# Patient Record
Sex: Male | Born: 1995 | Race: White | Hispanic: No | Marital: Single | Smoking: Current every day smoker
Health system: Southern US, Community
[De-identification: ages and names within clinical notes are randomized; demographics above are authoritative.]

## PROBLEM LIST (undated history)

## (undated) DIAGNOSIS — F32A Depression, unspecified: Secondary | ICD-10-CM

## (undated) DIAGNOSIS — K219 Gastro-esophageal reflux disease without esophagitis: Secondary | ICD-10-CM

## (undated) DIAGNOSIS — F1920 Other psychoactive substance dependence, uncomplicated: Secondary | ICD-10-CM

## (undated) DIAGNOSIS — R569 Unspecified convulsions: Secondary | ICD-10-CM

## (undated) DIAGNOSIS — T7840XA Allergy, unspecified, initial encounter: Secondary | ICD-10-CM

## (undated) DIAGNOSIS — D649 Anemia, unspecified: Secondary | ICD-10-CM

## (undated) DIAGNOSIS — F1910 Other psychoactive substance abuse, uncomplicated: Secondary | ICD-10-CM

## (undated) DIAGNOSIS — Z8619 Personal history of other infectious and parasitic diseases: Secondary | ICD-10-CM

## (undated) DIAGNOSIS — F329 Major depressive disorder, single episode, unspecified: Secondary | ICD-10-CM

## (undated) DIAGNOSIS — F419 Anxiety disorder, unspecified: Secondary | ICD-10-CM

## (undated) HISTORY — DX: Anemia, unspecified: D64.9

## (undated) HISTORY — DX: Anxiety disorder, unspecified: F41.9

## (undated) HISTORY — DX: Major depressive disorder, single episode, unspecified: F32.9

## (undated) HISTORY — PX: NO PAST SURGERIES: SHX2092

## (undated) HISTORY — DX: Personal history of other infectious and parasitic diseases: Z86.19

## (undated) HISTORY — DX: Depression, unspecified: F32.A

## (undated) HISTORY — DX: Unspecified convulsions: R56.9

## (undated) HISTORY — DX: Gastro-esophageal reflux disease without esophagitis: K21.9

## (undated) HISTORY — DX: Other psychoactive substance abuse, uncomplicated: F19.10

## (undated) HISTORY — DX: Allergy, unspecified, initial encounter: T78.40XA

---
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
## 2017-01-23 ENCOUNTER — Other Ambulatory Visit: Payer: Self-pay

## 2017-01-23 ENCOUNTER — Encounter: Payer: Self-pay | Admitting: Physician Assistant

## 2017-01-23 ENCOUNTER — Ambulatory Visit (INDEPENDENT_AMBULATORY_CARE_PROVIDER_SITE_OTHER): Payer: Managed Care, Other (non HMO) | Admitting: Physician Assistant

## 2017-01-23 VITALS — BP 118/84 | HR 95 | Temp 98.1°F | Resp 16 | Ht 71.0 in | Wt 173.0 lb

## 2017-01-23 DIAGNOSIS — F1290 Cannabis use, unspecified, uncomplicated: Secondary | ICD-10-CM

## 2017-01-23 DIAGNOSIS — F329 Major depressive disorder, single episode, unspecified: Secondary | ICD-10-CM | POA: Diagnosis not present

## 2017-01-23 DIAGNOSIS — F12988 Cannabis use, unspecified with other cannabis-induced disorder: Secondary | ICD-10-CM

## 2017-01-23 DIAGNOSIS — F419 Anxiety disorder, unspecified: Secondary | ICD-10-CM

## 2017-01-23 DIAGNOSIS — K219 Gastro-esophageal reflux disease without esophagitis: Secondary | ICD-10-CM

## 2017-01-23 DIAGNOSIS — F32A Depression, unspecified: Secondary | ICD-10-CM

## 2017-01-23 LAB — COMPREHENSIVE METABOLIC PANEL
ALT: 20 U/L (ref 0–53)
AST: 23 U/L (ref 0–37)
Albumin: 5.4 g/dL — ABNORMAL HIGH (ref 3.5–5.2)
Alkaline Phosphatase: 60 U/L (ref 52–171)
BUN: 11 mg/dL (ref 6–23)
CO2: 28 mEq/L (ref 19–32)
Calcium: 10.5 mg/dL (ref 8.4–10.5)
Chloride: 101 mEq/L (ref 96–112)
Creatinine, Ser: 1.08 mg/dL (ref 0.40–1.50)
GFR: 93.02 mL/min (ref >60.00)
Glucose, Bld: 111 mg/dL — ABNORMAL HIGH (ref 70–99)
Potassium: 4 mEq/L (ref 3.5–5.1)
Sodium: 137 mEq/L (ref 135–145)
Total Bilirubin: 0.8 mg/dL (ref 0.2–1.2)
Total Protein: 7.8 g/dL (ref 6.0–8.3)

## 2017-01-23 LAB — CBC
HCT: 45.2 % (ref 36.0–49.0)
Hemoglobin: 15.4 g/dL (ref 12.0–16.0)
MCHC: 34.2 g/dL (ref 31.0–37.0)
MCV: 92.8 fl (ref 78.0–98.0)
Platelets: 279 10*3/uL (ref 150.0–575.0)
RBC: 4.87 Mil/uL (ref 3.80–5.70)
RDW: 13.3 % (ref 11.4–15.5)
WBC: 9.9 10*3/uL (ref 4.5–13.5)

## 2017-01-23 LAB — TSH: TSH: 1.86 u[IU]/mL (ref 0.40–5.00)

## 2017-01-23 MED ORDER — ESCITALOPRAM OXALATE 10 MG PO TABS: 10.0000 mg | ORAL_TABLET | Freq: Every day | ORAL | 1 refills | Status: DC

## 2017-01-23 MED ORDER — PANTOPRAZOLE SODIUM 40 MG PO TBEC: 40.0000 mg | DELAYED_RELEASE_TABLET | Freq: Every day | ORAL | 3 refills | Status: DC

## 2017-01-23 NOTE — Patient Instructions (Signed)
Please go to the lab today for blood work.  I will call you with your results. We will alter treatment regimen(s) if indicated by your results.   Please start the Protonix daily as directed. Start a ginger supplement for nausea. Avoid any alcohol consumption of marijuana use. The alcohol can worsen reflux and repetitive marijuana use can cause cyclical vomiting.  Please start the Lexapro as directed. Download the [employer] APP for your phone and try to do these exercises daily.   Please use the handout given to schedule an appointment with counseling. There is a list of crises numbers as well if needed.  You will get through this!  Follow-up with me in 2-3 weeks for reassessment. Return sooner if needed.  Er for any suicidal thoughts or ideations.  Welcome to [facility]!

## 2017-01-23 NOTE — Progress Notes (Signed)
Patient presents to clinic today to establish care.  Patient endorses an issue over the past several months with intermittent episodes of heartburn and indigestion, sometimes associated with nausea.  Patient endorses this is gotten worse over the past few weeks, especially over the past 3 days.  Has had a couple episodes of nonbloody emesis.  States this sometimes happens after meals but not always.  Patient denies any abdominal pain, diarrhea, constipation, tenesmus, melena or hematochezia.  Patient endorses a history of acid reflux, requiring previous treatment.  Is not currently taking anything for symptoms.  Patient endorses he eats late at night.  Does drink alcohol occasionally but is not a daily thing.  Notes he does smoke marijuana on a regular basis.  On questioning of marijuana use, patient reveals that he uses this for episodes of significant anxiety.  Patient endorses a history of depression, previously treated with Lexapro.  Has noted significant changes recently including moving into a new apartment and losing his girlfriend.  Patient notes that she was cheating on him with his roommate.  Has had a very hard time dealing with this.  Endorses significantly depressed mood and anhedonia.  Notes this is mixed with episodes of acute anxiety.  Denies chest pain or shortness of breath.  Denies suicidal thought or ideation.   Past Medical History:  Diagnosis Date  . Allergy   . Depression   . GERD (gastroesophageal reflux disease)   . History of chickenpox     Past Surgical History:  Procedure Laterality Date  . NO PAST SURGERIES      No current outpatient medications on file prior to visit.   No current facility-administered medications on file prior to visit.     No Known Allergies  Family History  Problem Relation Age of Onset  . Alcohol abuse Father   . Alcohol abuse Brother   . Asthma Brother   . Diabetes Brother   . Arthritis Maternal Grandmother   . Asthma Maternal  Grandmother   . COPD Maternal Grandmother   . Alcohol abuse Maternal Grandfather   . Alcohol abuse Paternal Grandfather   . Heart attack Paternal Grandfather   . Hyperlipidemia Paternal Grandfather     Social History   Socioeconomic History  . Marital status: Married    Spouse name: Not on file  . Number of children: Not on file  . Years of education: Not on file  . Highest education level: Not on file  Social Needs  . Financial resource strain: Not on file  . Food insecurity - worry: Not on file  . Food insecurity - inability: Not on file  . Transportation needs - medical: Not on file  . Transportation needs - non-medical: Not on file  Occupational History  . Not on file  Tobacco Use  . Smoking status: Smoker, Current Status Unknown    Packs/day: 0.50    Types: Cigarettes  . Smokeless tobacco: Never Used  Substance and Sexual Activity  . Alcohol use: Yes  . Drug use: No  . Sexual activity: Yes  Other Topics Concern  . Not on file  Social History Narrative  . Not on file   Review of Systems  Constitutional: Negative for chills, fever and malaise/fatigue.  Eyes: Negative for blurred vision and double vision.  Respiratory: Negative for cough, sputum production and shortness of breath.   Cardiovascular: Negative for chest pain.  Gastrointestinal: Positive for heartburn, nausea and vomiting. Negative for abdominal pain, blood in stool, constipation, diarrhea  and melena.  Genitourinary: Negative for dysuria, flank pain, frequency and urgency.  Musculoskeletal: Negative for myalgias.  Neurological: Negative for dizziness and loss of consciousness.  Psychiatric/Behavioral: Positive for depression and substance abuse. Negative for hallucinations, memory loss and suicidal ideas. The patient is nervous/anxious. The patient does not have insomnia.     BP 118/84   Pulse 95   Temp 98.1 F (36.7 C) (Oral)   Resp 16   Ht 5\' 11"  (1.803 m)   Wt 173 lb (78.5 kg)   SpO2 99%    BMI 24.13 kg/m   Physical Exam  Constitutional: He is oriented to person, place, and time and well-developed, well-nourished, and in no distress.  HENT:  Head: Normocephalic and atraumatic.  Right Ear: External ear normal.  Left Ear: External ear normal.  Nose: Nose normal.  Mouth/Throat: Oropharynx is clear and moist. No oropharyngeal exudate.  TM within normal limits bilaterally.  Eyes: Conjunctivae and EOM are normal. Pupils are equal, round, and reactive to light.  Neck: Neck supple.  Cardiovascular: Normal rate, regular rhythm, normal heart sounds and intact distal pulses.  Pulmonary/Chest: Effort normal and breath sounds normal. No respiratory distress. He has no wheezes. He has no rales. He exhibits no tenderness.  Abdominal: Soft. Bowel sounds are normal. He exhibits no distension. There is no tenderness.  Lymphadenopathy:    He has no cervical adenopathy.  Neurological: He is alert and oriented to person, place, and time. No cranial nerve deficit.  Skin: Skin is warm and dry. No rash noted.  Psychiatric: His mood appears anxious. His affect is not labile. He is not agitated. He does not express impulsivity. He exhibits a depressed mood. He expresses no homicidal and no suicidal ideation. He expresses no suicidal plans and no homicidal plans. He is not apathetic. He exhibits ordered thought content. He does not have a flat affect.  Vitals reviewed.  Assessment/Plan: Gastroesophageal reflux disease without esophagitis Discussed GERD diet.  Encourage patient to start a probiotic.  We will start Protonix 40 mg once daily times 2 weeks.  Patient to avoid late night eating and elevate head of his bed.  Suspect that increase in reflux is secondary to his significant anxiety area and unfortunately these go hand in hand.  We will also work on treatment for his anxiety and depressed mood and see how gastrointestinal symptoms respond.  Close follow-up scheduled.  Anxiety and depression New  onset in patient with a prior history of depression.  Will start Lexapro.  Counseling recommended.  Handout given for counseling services and crises Hotlines.  No current alarm signs or symptoms.  Will check CBC, TSH.  Patient encouraged to start mindfulness training.  Alarm signs and symptoms discussed with patient that would prompt need for immediate emergency evaluation.  Patient voices understanding and agreement with plan.  Follow-up scheduled.  Cannabinoid hyperemesis syndrome (HCC) Discussed with patient that vomiting episode is likely secondary to both acid reflux and consistent use of marijuana.  On further discussion patient does endorse that his symptoms of nausea and vomiting vastly improve when taking a hot shower.  This is atypical response to nausea and vomiting in a patient suffering from cannabinoids hyperemesis syndrome.  Patient encouraged to discontinue use.  Will check CBC and CMP today. A close follow-up scheduled.     Piedad ClimesWilliam Cody Michiel Sivley, PA-C

## 2017-01-23 NOTE — Progress Notes (Signed)
Pre visit review using our clinic review tool, if applicable. No additional management support is needed unless otherwise documented below in the visit note. 

## 2017-01-24 DIAGNOSIS — R112 Nausea with vomiting, unspecified: Secondary | ICD-10-CM | POA: Insufficient documentation

## 2017-01-24 DIAGNOSIS — F419 Anxiety disorder, unspecified: Secondary | ICD-10-CM | POA: Insufficient documentation

## 2017-01-24 DIAGNOSIS — F12988 Cannabis use, unspecified with other cannabis-induced disorder: Secondary | ICD-10-CM

## 2017-01-24 DIAGNOSIS — F329 Major depressive disorder, single episode, unspecified: Secondary | ICD-10-CM | POA: Insufficient documentation

## 2017-01-24 DIAGNOSIS — K219 Gastro-esophageal reflux disease without esophagitis: Secondary | ICD-10-CM | POA: Insufficient documentation

## 2017-01-24 DIAGNOSIS — F32A Depression, unspecified: Secondary | ICD-10-CM | POA: Insufficient documentation

## 2017-01-24 NOTE — Assessment & Plan Note (Signed)
Discussed with patient that vomiting episode is likely secondary to both acid reflux and consistent use of marijuana.  On further discussion patient does endorse that his symptoms of nausea and vomiting vastly improve when taking a hot shower.  This is atypical response to nausea and vomiting in a patient suffering from cannabinoids hyperemesis syndrome.  Patient encouraged to discontinue use.  Will check CBC and CMP today. A close follow-up scheduled.

## 2017-01-24 NOTE — Assessment & Plan Note (Signed)
Discussed GERD diet.  Encourage patient to start a probiotic.  We will start Protonix 40 mg once daily times 2 weeks.  Patient to avoid late night eating and elevate head of his bed.  Suspect that increase in reflux is secondary to his significant anxiety area and unfortunately these go hand in hand.  We will also work on treatment for his anxiety and depressed mood and see how gastrointestinal symptoms respond.  Close follow-up scheduled.

## 2017-01-24 NOTE — Assessment & Plan Note (Signed)
New onset in patient with a prior history of depression.  Will start Lexapro.  Counseling recommended.  Handout given for counseling services and crises Hotlines.  No current alarm signs or symptoms.  Will check CBC, TSH.  Patient encouraged to start mindfulness training.  Alarm signs and symptoms discussed with patient that would prompt need for immediate emergency evaluation.  Patient voices understanding and agreement with plan.  Follow-up scheduled.

## 2017-02-13 ENCOUNTER — Ambulatory Visit: Payer: Managed Care, Other (non HMO) | Admitting: Physician Assistant

## 2017-02-25 ENCOUNTER — Ambulatory Visit: Payer: Managed Care, Other (non HMO) | Admitting: Physician Assistant

## 2017-02-25 ENCOUNTER — Encounter: Payer: Self-pay | Admitting: Physician Assistant

## 2017-02-25 VITALS — BP 118/64 | HR 73 | Temp 98.2°F | Resp 14 | Ht 71.0 in | Wt 173.0 lb

## 2017-02-25 DIAGNOSIS — K219 Gastro-esophageal reflux disease without esophagitis: Secondary | ICD-10-CM

## 2017-02-25 DIAGNOSIS — F419 Anxiety disorder, unspecified: Secondary | ICD-10-CM | POA: Diagnosis not present

## 2017-02-25 DIAGNOSIS — F32A Depression, unspecified: Secondary | ICD-10-CM

## 2017-02-25 DIAGNOSIS — F329 Major depressive disorder, single episode, unspecified: Secondary | ICD-10-CM

## 2017-02-25 MED ORDER — ESCITALOPRAM OXALATE 20 MG PO TABS: 20.0000 mg | ORAL_TABLET | Freq: Every day | ORAL | 1 refills | Status: DC

## 2017-02-25 NOTE — Assessment & Plan Note (Signed)
Much improved. Continue current regimen. Follow-up 2 months. We will work on wean from medication at that point. Continue GERD diet.

## 2017-02-25 NOTE — Patient Instructions (Signed)
Please stay well-hydrated and keep a well-balanced diet. Continue the Protonix. Start the new dose of Lexapro daily.  Follow recommendations below. Try the Pure ZZZs over the counter or unisom to help with sleep as needed.  Follow-up 2 months.  Sleep Hygiene  Do: (1) Go to bed at the same time each day. (2) Get up from bed at the same time each day. (3) Get regular exercise each day, preferably in the morning.  There is goof evidence that regular exercise improves restful sleep.  This includes stretching and aerobic exercise. (4) Get regular exposure to outdoor or bright lights, especially in the late afternoon. (5) Keep the temperature in your bedroom comfortable. (6) Keep the bedroom quiet when sleeping. (7) Keep the bedroom dark enough to facilitate sleep. (8) Use your bed only for sleep and sex. (9) Take medications as directed.  It is helpful to take prescribed sleeping pills 1 hour before bedtime, so they are causing drowsiness when you lie down, or 10 hours before getting up, to avoid daytime drowsiness. (10) Use a relaxation exercise just before going to sleep -- imagery, massage, warm bath. (11) Keep your feet and hands warm.  Wear warm socks and/or mittens or gloves to bed.  Don't: (1) Exercise just before going to bed. (2) Engage in stimulating activity just before bed, such as playing a competitive game, watching an exciting program on television, or having an important discussion with a loved one. (3) Have caffeine in the evening (coffee, teas, chocolate, sodas, etc.) (4) Read or watch television in bed. (5) Use alcohol to help you sleep. (6) Go to bed too hungry or too full. (7) Take another person's sleeping pills. (8) Take over-the-counter sleeping pills, without your doctor's knowledge.  Tolerance can develop rapidly with these medications.  Diphenhydramine can have serious side effects for elderly patients. (9) Take daytime naps. (10) Command yourself to go to  sleep.  This only makes your mind and body more alert.  If you lie awake for more than 20-30 minutes, get up, go to a different room, participate in a quiet activity (Ex - non-excitable reading or television), and then return to bed when you feel sleepy.  Do this as many times during the night as needed.  This may cause you to have a night or two of poor sleep but it will train your brain to know when it is time for sleep.

## 2017-02-25 NOTE — Assessment & Plan Note (Signed)
Improving. Will increase Lexapro to 20 mg daily. Sleep hygiene recommendations given. Recommended OTC Pure ZZZs at night. Follow-up scheduled.

## 2017-02-25 NOTE — Progress Notes (Signed)
Patient presents to clinic today for follow-up of GERD and generalized anxiety disorder.  At last visit, we started patient on Protonix daily. Is taking as directed and tolerating well. Notes resolution of nausea and vomiting along with heartburn with this regimen. Is hydrating and eating better. Denies abdominal pain or cramping. Denies change to bowel or bladder habits.  Patient also currently on a regimen of Lexapro 10 mg daily. Is taking as directed. Notes tolerating well. Patient endorses improvement in anxiety from last visit. Denies panic attack or easy tearfulness. Is still adjusting to new home. Is having some issue still with falling and staying asleep. Is averaging about 4 hours of sleep per night. Is taking OTC melatonin occasionally with some improvement.   Past Medical History:  Diagnosis Date  . Allergy   . Depression   . GERD (gastroesophageal reflux disease)   . History of chickenpox     Current Outpatient Medications on File Prior to Visit  Medication Sig Dispense Refill  . pantoprazole (PROTONIX) 40 MG tablet Take 1 tablet (40 mg total) by mouth daily. 30 tablet 3   No current facility-administered medications on file prior to visit.     No Known Allergies  Family History  Problem Relation Age of Onset  . Alcohol abuse Father   . Alcohol abuse Brother   . Asthma Brother   . Diabetes Brother   . Arthritis Maternal Grandmother   . Asthma Maternal Grandmother   . COPD Maternal Grandmother   . Alcohol abuse Maternal Grandfather   . Alcohol abuse Paternal Grandfather   . Heart attack Paternal Grandfather   . Hyperlipidemia Paternal Grandfather     Social History   Socioeconomic History  . Marital status: Married    Spouse name: None  . Number of children: None  . Years of education: None  . Highest education level: None  Social Needs  . Financial resource strain: None  . Food insecurity - worry: None  . Food insecurity - inability: None  .  Transportation needs - medical: None  . Transportation needs - non-medical: None  Occupational History  . None  Tobacco Use  . Smoking status: Smoker, Current Status Unknown    Packs/day: 0.50    Types: Cigarettes  . Smokeless tobacco: Never Used  Substance and Sexual Activity  . Alcohol use: Yes  . Drug use: No  . Sexual activity: Yes  Other Topics Concern  . None  Social History Narrative  . None   Review of Systems - See HPI.  All other ROS are negative.  BP 118/64   Pulse 73   Temp 98.2 F (36.8 C) (Oral)   Resp 14   Ht 5' 11" (1.803 m)   Wt 173 lb (78.5 kg)   SpO2 99%   BMI 24.13 kg/m   Physical Exam  Constitutional: He is oriented to person, place, and time and well-developed, well-nourished, and in no distress.  HENT:  Head: Normocephalic and atraumatic.  Eyes: Conjunctivae are normal.  Neck: Neck supple.  Cardiovascular: Normal rate, regular rhythm, normal heart sounds and intact distal pulses.  Pulmonary/Chest: Effort normal and breath sounds normal. No respiratory distress. He has no wheezes. He has no rales. He exhibits no tenderness.  Abdominal: Soft. Bowel sounds are normal. He exhibits no distension.  Neurological: He is alert and oriented to person, place, and time.  Skin: Skin is warm and dry. No rash noted.  Psychiatric: Affect normal.  Vitals reviewed.  Recent Results (from  the past 2160 hour(s))  Comp Met (CMET)     Status: Abnormal   Collection Time: 01/23/17  2:59 PM  Result Value Ref Range   Sodium 137 135 - 145 mEq/L   Potassium 4.0 3.5 - 5.1 mEq/L   Chloride 101 96 - 112 mEq/L   CO2 28 19 - 32 mEq/L   Glucose, Bld 111 (H) 70 - 99 mg/dL   BUN 11 6 - 23 mg/dL   Creatinine, Ser 1.08 0.40 - 1.50 mg/dL   Total Bilirubin 0.8 0.2 - 1.2 mg/dL   Alkaline Phosphatase 60 52 - 171 U/L   AST 23 0 - 37 U/L   ALT 20 0 - 53 U/L   Total Protein 7.8 6.0 - 8.3 g/dL   Albumin 5.4 (H) 3.5 - 5.2 g/dL   Calcium 10.5 8.4 - 10.5 mg/dL   GFR 93.02 >60.00  mL/min  TSH     Status: None   Collection Time: 01/23/17  2:59 PM  Result Value Ref Range   TSH 1.86 0.40 - 5.00 uIU/mL  CBC     Status: None   Collection Time: 01/23/17  2:59 PM  Result Value Ref Range   WBC 9.9 4.5 - 13.5 K/uL   RBC 4.87 3.80 - 5.70 Mil/uL   Platelets 279.0 150.0 - 575.0 K/uL   Hemoglobin 15.4 12.0 - 16.0 g/dL   HCT 45.2 36.0 - 49.0 %   MCV 92.8 78.0 - 98.0 fl   MCHC 34.2 31.0 - 37.0 g/dL   RDW 13.3 11.4 - 15.5 %   Assessment/Plan: Gastroesophageal reflux disease without esophagitis Much improved. Continue current regimen. Follow-up 2 months. We will work on wean from medication at that point. Continue GERD diet.   Anxiety and depression Improving. Will increase Lexapro to 20 mg daily. Sleep hygiene recommendations given. Recommended OTC Pure ZZZs at night. Follow-up scheduled.     Leeanne Rio, PA-C

## 2017-03-04 ENCOUNTER — Telehealth: Payer: Self-pay | Admitting: Physician Assistant

## 2017-03-04 NOTE — Telephone Encounter (Signed)
Ok to phone in a script for alprazolam 0.5 mg. Take 1 tablet by mouth QHS as needed for acute anxiety. Quantity 15 with 0 refills.

## 2017-03-04 NOTE — Telephone Encounter (Signed)
Copied from CRM [id number]. Topic: Quick Communication - See Telephone Encounter >> Mar 04, 2017 10:29 AM Clack, Princella Pellegrini wrote: CRM for notification. See Telephone encounter for:  Pt grandmother Corey Rivera wanted to know if the provider could call the pt in something for anxiety attacks. States the pt had a bad one last night and has them mostly at night time.  03/04/17.

## 2017-03-04 NOTE — Telephone Encounter (Signed)
Patient states that he is having anxiety attacks mainly at night.  He said when he goes to try to go to sleep his mind starts racing and the anxiety just builds and he ends up being up for several hours before it calms down enough for him to go to sleep.  He said this happens around 3 times a week or so.   He states that he does not want to be on anything every day, just something when he needs it.

## 2017-03-04 NOTE — Telephone Encounter (Signed)
Spoke with patient's grandmother - patient is taking Lexapro regularly but is really stressed after the breakup with his girlfriend.  At night he seems to get "worked up" and has an anxiety attack.   Patient and grandmother are asking if there is anything that can be given to him to just have on hand to take when this happens.    Routed to provider to advise

## 2017-03-04 NOTE — Telephone Encounter (Signed)
Attempted to call patient to discuss. VM is not set up so I could not leave a message.  CRM created so that [facility] knows to transfer to [place] if patient calls back.

## 2017-03-04 NOTE — Telephone Encounter (Signed)
I am fine with considering this but I want [place] to talk to the actual patient regarding symptom severity to get a better idea of what all is going on. Can we have him reach out to [place] as well?

## 2017-03-04 NOTE — Telephone Encounter (Addendum)
Medication has been called in to [facility] in [place].   Patient is aware.

## 2017-03-10 ENCOUNTER — Other Ambulatory Visit: Payer: Self-pay

## 2017-03-10 ENCOUNTER — Emergency Department (HOSPITAL_COMMUNITY): Payer: Managed Care, Other (non HMO)

## 2017-03-10 ENCOUNTER — Encounter (HOSPITAL_COMMUNITY): Payer: Self-pay | Admitting: Emergency Medicine

## 2017-03-10 ENCOUNTER — Emergency Department (HOSPITAL_COMMUNITY)
Admission: EM | Admit: 2017-03-10 | Discharge: 2017-03-11 | Disposition: A | Discharge status: Home / Self Care | Payer: Managed Care, Other (non HMO) | Attending: Emergency Medicine | Admitting: Emergency Medicine

## 2017-03-10 DIAGNOSIS — T43292A Poisoning by other antidepressants, intentional self-harm, initial encounter: Secondary | ICD-10-CM | POA: Insufficient documentation

## 2017-03-10 DIAGNOSIS — F1220 Cannabis dependence, uncomplicated: Secondary | ICD-10-CM | POA: Diagnosis not present

## 2017-03-10 DIAGNOSIS — F1420 Cocaine dependence, uncomplicated: Secondary | ICD-10-CM | POA: Diagnosis not present

## 2017-03-10 DIAGNOSIS — T43224A Poisoning by selective serotonin reuptake inhibitors, undetermined, initial encounter: Secondary | ICD-10-CM | POA: Diagnosis not present

## 2017-03-10 DIAGNOSIS — R45851 Suicidal ideations: Secondary | ICD-10-CM | POA: Insufficient documentation

## 2017-03-10 DIAGNOSIS — T424X4A Poisoning by benzodiazepines, undetermined, initial encounter: Secondary | ICD-10-CM | POA: Diagnosis not present

## 2017-03-10 DIAGNOSIS — F1910 Other psychoactive substance abuse, uncomplicated: Secondary | ICD-10-CM | POA: Diagnosis present

## 2017-03-10 DIAGNOSIS — F1020 Alcohol dependence, uncomplicated: Secondary | ICD-10-CM | POA: Diagnosis not present

## 2017-03-10 DIAGNOSIS — F419 Anxiety disorder, unspecified: Secondary | ICD-10-CM | POA: Diagnosis present

## 2017-03-10 DIAGNOSIS — F329 Major depressive disorder, single episode, unspecified: Secondary | ICD-10-CM

## 2017-03-10 DIAGNOSIS — Z79899 Other long term (current) drug therapy: Secondary | ICD-10-CM | POA: Diagnosis not present

## 2017-03-10 DIAGNOSIS — T50902A Poisoning by unspecified drugs, medicaments and biological substances, intentional self-harm, initial encounter: Secondary | ICD-10-CM

## 2017-03-10 DIAGNOSIS — F332 Major depressive disorder, recurrent severe without psychotic features: Secondary | ICD-10-CM | POA: Diagnosis not present

## 2017-03-10 DIAGNOSIS — Z811 Family history of alcohol abuse and dependence: Secondary | ICD-10-CM | POA: Diagnosis not present

## 2017-03-10 LAB — CBC WITH DIFFERENTIAL/PLATELET
BASOS ABS: 0 10*3/uL (ref 0.0–0.1)
BASOS PCT: 0 %
EOS ABS: 0.1 10*3/uL (ref 0.0–0.7)
Eosinophils Relative: 1 %
HEMATOCRIT: 46.7 % (ref 39.0–52.0)
HEMOGLOBIN: 16.5 g/dL (ref 13.0–17.0)
Lymphocytes Relative: 10 %
Lymphs Abs: 1.4 10*3/uL (ref 0.7–4.0)
MCH: 32.7 pg (ref 26.0–34.0)
MCHC: 35.3 g/dL (ref 30.0–36.0)
MCV: 92.5 fL (ref 78.0–100.0)
MONOS PCT: 7 %
Monocytes Absolute: 1 10*3/uL (ref 0.1–1.0)
NEUTROS ABS: 10.9 10*3/uL — AB (ref 1.7–7.7)
NEUTROS PCT: 82 %
Platelets: 216 10*3/uL (ref 150–400)
RBC: 5.05 MIL/uL (ref 4.22–5.81)
RDW: 13.4 % (ref 11.5–15.5)
WBC: 13.3 10*3/uL — AB (ref 4.0–10.5)

## 2017-03-10 LAB — URINALYSIS, ROUTINE W REFLEX MICROSCOPIC
BILIRUBIN URINE: NEGATIVE
GLUCOSE, UA: NEGATIVE mg/dL
HGB URINE DIPSTICK: NEGATIVE
Ketones, ur: NEGATIVE mg/dL
Leukocytes, UA: NEGATIVE
Nitrite: NEGATIVE
Protein, ur: NEGATIVE mg/dL
SPECIFIC GRAVITY, URINE: 1.02 (ref 1.005–1.030)
pH: 7 (ref 5.0–8.0)

## 2017-03-10 LAB — COMPREHENSIVE METABOLIC PANEL
ALK PHOS: 77 U/L (ref 38–126)
ALT: 19 U/L (ref 17–63)
AST: 33 U/L (ref 15–41)
Albumin: 5.2 g/dL — ABNORMAL HIGH (ref 3.5–5.0)
Anion gap: 9 (ref 5–15)
BUN: 7 mg/dL (ref 6–20)
CALCIUM: 9.7 mg/dL (ref 8.9–10.3)
CHLORIDE: 99 mmol/L — AB (ref 101–111)
CO2: 29 mmol/L (ref 22–32)
CREATININE: 1.12 mg/dL (ref 0.61–1.24)
GFR calc non Af Amer: >60 mL/min (ref >60)
Glucose, Bld: 104 mg/dL — ABNORMAL HIGH (ref 65–99)
Potassium: 4.1 mmol/L (ref 3.5–5.1)
SODIUM: 137 mmol/L (ref 135–145)
Total Bilirubin: 0.4 mg/dL (ref 0.3–1.2)
Total Protein: 8.1 g/dL (ref 6.5–8.1)

## 2017-03-10 LAB — RAPID URINE DRUG SCREEN, HOSP PERFORMED
Amphetamines: NOT DETECTED
BARBITURATES: NOT DETECTED
Benzodiazepines: POSITIVE — AB
Cocaine: POSITIVE — AB
OPIATES: NOT DETECTED
TETRAHYDROCANNABINOL: POSITIVE — AB

## 2017-03-10 LAB — SALICYLATE LEVEL

## 2017-03-10 LAB — ACETAMINOPHEN LEVEL

## 2017-03-10 LAB — ETHANOL: Alcohol, Ethyl (B): <10 mg/dL (ref <10)

## 2017-03-10 MED ORDER — LORAZEPAM 2 MG/ML IJ SOLN: 0.0000 mg | Freq: Two times a day (BID) | INTRAMUSCULAR | Status: DC

## 2017-03-10 MED ORDER — LORAZEPAM 1 MG PO TABS
0.0000 mg | ORAL_TABLET | Freq: Four times a day (QID) | ORAL | Status: DC
  Administered 2017-03-10: 1 mg via ORAL
  Administered 2017-03-11: 2 mg via ORAL
  Filled 2017-03-10: qty 2
  Filled 2017-03-10: qty 1

## 2017-03-10 MED ORDER — THIAMINE HCL 100 MG/ML IJ SOLN: 100.0000 mg | Freq: Every day | INTRAMUSCULAR | Status: DC

## 2017-03-10 MED ORDER — LORAZEPAM 1 MG PO TABS: 0.0000 mg | ORAL_TABLET | Freq: Two times a day (BID) | ORAL | Status: DC

## 2017-03-10 MED ORDER — LORAZEPAM 2 MG/ML IJ SOLN: 0.0000 mg | Freq: Four times a day (QID) | INTRAMUSCULAR | Status: DC

## 2017-03-10 MED ORDER — SODIUM CHLORIDE 0.9 % IV BOLUS (SEPSIS)
1000.0000 mL | Freq: Once | INTRAVENOUS | Status: AC
  Administered 2017-03-10: 1000 mL via INTRAVENOUS

## 2017-03-10 MED ORDER — VITAMIN B-1 100 MG PO TABS
100.0000 mg | ORAL_TABLET | Freq: Every day | ORAL | Status: DC
  Administered 2017-03-10 – 2017-03-11 (×2): 100 mg via ORAL
  Filled 2017-03-10 (×2): qty 1

## 2017-03-10 MED ORDER — PANTOPRAZOLE SODIUM 40 MG PO TBEC
40.0000 mg | DELAYED_RELEASE_TABLET | Freq: Every day | ORAL | Status: DC
  Administered 2017-03-11: 40 mg via ORAL
  Filled 2017-03-10: qty 1

## 2017-03-10 MED ORDER — ONDANSETRON HCL 4 MG/2ML IJ SOLN
4.0000 mg | Freq: Once | INTRAMUSCULAR | Status: AC
  Administered 2017-03-10: 4 mg via INTRAVENOUS
  Filled 2017-03-10: qty 2

## 2017-03-10 NOTE — ED Triage Notes (Signed)
Pt stated that he fell and struck his head multiple times yesterday. Abrasion noted on r/temple, and behind r/ear. Raised area noted on l/forehead. Pt stated that he was intoxicated when he  fell Pt can not remember events after the fall. Pt stated that he woke up and and went to work and "was out of it and stumbled a few times". He vomited multiple times this am.  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 Family member brought him to ED. Pt is alert, oriented and ambulatory

## 2017-03-10 NOTE — ED Notes (Signed)
CIWA 8

## 2017-03-10 NOTE — ED Notes (Signed)
Bed: WA05 Expected date:  Expected time:  Means of arrival:  Comments: Triage 2 

## 2017-03-10 NOTE — ED Notes (Signed)
Patient transported to CT 

## 2017-03-10 NOTE — ED Notes (Signed)
Pt grandmother is at bedside.

## 2017-03-10 NOTE — ED Provider Notes (Signed)
[facility]-EMERGENCY DEPT Provider Note   CSN: [id number] Arrival date & time: 03/10/17  1053     History   Chief Complaint Chief Complaint  Patient presents with  . Head Injury  . Suicidal  . Drug Overdose    HPI Corey Rivera is a 22 y.o. male.  HPI   Corey Rivera is a 22 y.o. male, with a history of depression and GERD, presenting to the ED with injuries from a fall, intentional overdose, and SI.   Patient began drinking alcohol at 2 PM yesterday.  States he drank at least a half a gallon of bourbon at that time. Patient's grandmother saw him at 6 PM yesterday and said he had already fallen at least once because he had facial bruising.  Grandmother found empty bottles of 10mg  Lexapro and 20mg  Lexapro 30 day supplies for both. States the pharmacy filled prescriptions for both the 10mg  and 20mg  on Jan 8. Adds one was supposed to be filled on Dec 27, but was never picked up. Then the dose was increased and both prescriptions were filled.  Patient states, "I don't know how many I took, I was eating them by the handful." "I just want to die. I want it all to end."  Patient also endorses taking up to eleven 0.5 mg Xanax tablets.  Patient states he woke up at 2 AM this morning and drank 5 more beers. Drinks at least a 12 pack of beer a day.  Endorses cocaine and recreational Xanax use.  Complains of generalized headache, facial pain, bilateral lateral femur pain, and left rib pain.  Pain in these areas is moderate and described as aching and sore.  Also endorses vomiting and instability when walking.  Denies shortness of breath, diarrhea, recent illness, seizure, abdominal pain, neck/back pain, weakness, numbness, or any other complaints.    Past Medical History:  Diagnosis Date  . Allergy   . Depression   . GERD (gastroesophageal reflux disease)   . History of chickenpox     Patient Active Problem List   Diagnosis Date Noted  . Anxiety and depression  01/24/2017  . Gastroesophageal reflux disease without esophagitis 01/24/2017    Past Surgical History:  Procedure Laterality Date  . NO PAST SURGERIES         Home Medications    Prior to Admission medications   Medication Sig Start Date End Date Taking? Authorizing Provider  ALPRAZolam Prudy Feeler) 0.5 MG tablet Take 0.5 mg by mouth at bedtime as needed. FOR ACUTE ANXIETY 03/04/17  Yes [provider]  escitalopram (LEXAPRO) 20 MG tablet Take 1 tablet (20 mg total) by mouth daily. 02/25/17  Yes Waldon Merl, PA-C  pantoprazole (PROTONIX) 40 MG tablet Take 1 tablet (40 mg total) by mouth daily. 01/23/17  Yes Waldon Merl, PA-C    Family History Family History  Problem Relation Age of Onset  . Alcohol abuse Father   . Alcohol abuse Brother   . Asthma Brother   . Diabetes Brother   . Arthritis Maternal Grandmother   . Asthma Maternal Grandmother   . COPD Maternal Grandmother   . Alcohol abuse Maternal Grandfather   . Alcohol abuse Paternal Grandfather   . Heart attack Paternal Grandfather   . Hyperlipidemia Paternal Grandfather     Social History Social History   Tobacco Use  . Smoking status: Smoker, Current Status Unknown    Packs/day: 0.50    Types: Cigarettes  . Smokeless tobacco: Never Used  Substance Use Topics  . Alcohol use: Yes    Comment: daily-one fifth  . Drug use: Yes    Types: Cocaine, Marijuana     Allergies   Patient has no known allergies.   Review of Systems Review of Systems  Constitutional: Negative for chills, diaphoresis and fever.  Respiratory: Negative for shortness of breath.   Cardiovascular: Negative for chest pain.  Gastrointestinal: Positive for nausea and vomiting. Negative for abdominal pain, blood in stool and diarrhea.  Musculoskeletal: Negative for back pain and neck pain.  Neurological: Positive for tremors, light-headedness and headaches. Negative for seizures, weakness and numbness.  All other systems  reviewed and are negative.    Physical Exam Updated Vital Signs BP 116/79 (BP Location: Right Arm)   Pulse (!) 108   Temp 98.2 F (36.8 C) (Oral)   Resp 18   Wt 79.4 kg (175 lb)   SpO2 98%   BMI 24.41 kg/m   Physical Exam  Constitutional: He is oriented to person, place, and time. He appears well-developed and well-nourished. No distress.  HENT:  Head: Normocephalic.  Right Ear: Tympanic membrane, external ear and ear canal normal. No hemotympanum.  Left Ear: Tympanic membrane, external ear and ear canal normal. No hemotympanum.  Nose: No sinus tenderness or nasal septal hematoma. No epistaxis.  Mouth/Throat: Oropharynx is clear and moist.  Multiple abrasions to the face.  Tenderness and minor swelling to the right forehead and right zygomatic region. Dentition appears intact.  Patient agrees. Mouth opening to at least 3 finger widths.  Patient handles oral secretions without difficulty.  No noted lingual or intraoral trauma.  Tenderness behind the right ear without evidence of bruising, swelling, or instability.  Eyes: Conjunctivae and EOM are normal. Pupils are equal, round, and reactive to light.  Neck: Normal range of motion. Neck supple.  Cardiovascular: Normal rate, regular rhythm, normal heart sounds and intact distal pulses.  Pulmonary/Chest: Effort normal and breath sounds normal. No respiratory distress.  Abdominal: Soft. There is no tenderness. There is no guarding.  Musculoskeletal: He exhibits tenderness. He exhibits no edema.  Normal motor function intact in all extremities and spine. No midline spinal tenderness.   Tenderness and swelling to bilateral lateral proximal femurs.  Full range of motion through the cardinal directions of the bilateral hips, knees, and ankles.  Full range of motion through the cardinal directions of the bilateral shoulders, elbows, and wrists.  Tenderness to left lateral ribs in the area ribs 7 through 9 without noted deformity,  swelling, ecchymosis, or crepitus.  Neurological: He is alert and oriented to person, place, and time. He displays tremor.  No sensory deficits.  No noted speech deficits. No aphasia. Patient handles oral secretions without difficulty. No noted swallowing defects.  Equal grip strength bilaterally. Strength 5/5 in the upper extremities. Strength 5/5 with flexion and extension of the hips, knees, and ankles bilaterally.  Negative Romberg. No gait disturbance.  Coordination intact including heel to shin and finger to nose.  Cranial nerves III-XII grossly intact.  No facial droop.   Skin: Skin is warm and dry. Capillary refill takes less than 2 seconds. He is not diaphoretic. No pallor.  Psychiatric: He has a normal mood and affect. His behavior is normal. He expresses suicidal ideation. He expresses suicidal plans.  Patient is calm and cooperative  Nursing note and vitals reviewed.    ED Treatments / Results  Labs (all labs ordered are listed, but only abnormal results are displayed) Labs Reviewed -  No data to display  EKG ED ECG REPORT   Date: 03/10/2017  EKG Time: 2:28 PM  Rate: 93  Rhythm: Sinus rhythm,  normal EKG, sinus rhythm, unchanged from previous tracings  Axis: 78  Intervals:none  ST&T Change: None  Narrative Interpretation: Sinus rhythm, no significant change since last tracing             Radiology Dg Ribs Unilateral W/chest Left  Result Date: 03/10/2017 CLINICAL DATA:  The patient had a fall or fall yesterday. The patient complains of pleuritic left lower lateral ribcage pain with shortness of breath. EXAM: LEFT RIBS AND CHEST - 3+ VIEW COMPARISON:  Chest x-ray of January 07, 2010 FINDINGS: The lungs are well-expanded and clear. There is no evidence of a pulmonary contusion, pneumothorax, or pleural effusion. The heart and mediastinal structures are normal. The pulmonary vascularity is not engorged. Left rib images include a metallic BB over the lower lateral  ribcage. No acute rib fracture is observed. IMPRESSION: No acute left rib fracture is demonstrated. There is no acute cardiopulmonary abnormality. Electronically Signed   By: David  [place] M.D.   On: 03/10/2017 15:12   Ct Head Wo Contrast  Result Date: 03/10/2017 CLINICAL DATA:  Posttraumatic headache and right-sided facial injury after multiple falls last night. EXAM: CT HEAD WITHOUT CONTRAST CT MAXILLOFACIAL WITHOUT CONTRAST CT CERVICAL SPINE WITHOUT CONTRAST TECHNIQUE: Multidetector CT imaging of the head, cervical spine, and maxillofacial structures were performed using the standard protocol without intravenous contrast. Multiplanar CT image reconstructions of the cervical spine and maxillofacial structures were also generated. COMPARISON:  None. FINDINGS: CT HEAD FINDINGS Brain: No evidence of acute infarction, hemorrhage, hydrocephalus, extra-axial collection or mass lesion/mass effect. Vascular: No hyperdense vessel or unexpected calcification. Skull: Normal. Negative for fracture or focal lesion. Other: None. CT MAXILLOFACIAL FINDINGS Osseous: No fracture or mandibular dislocation. No destructive process. Orbits: Negative. No traumatic or inflammatory finding. Sinuses: Mild bilateral ethmoid and maxillary sinusitis is noted. Soft tissues: Negative. CT CERVICAL SPINE FINDINGS Alignment: Normal. Skull base and vertebrae: No acute fracture. No primary bone lesion or focal pathologic process. Soft tissues and spinal canal: No prevertebral fluid or swelling. No visible canal hematoma. Disc levels:  Normal. Upper chest: Negative. Other: None. IMPRESSION: Normal head CT. Mild bilateral ethmoid and maxillary sinusitis. No other abnormality seen in maxillofacial region. Normal cervical spine. Electronically Signed   By: Lupita Raider, M.D.   On: 03/10/2017 15:34   Ct Cervical Spine Wo Contrast  Result Date: 03/10/2017 CLINICAL DATA:  Posttraumatic headache and right-sided facial injury after multiple falls  last night. EXAM: CT HEAD WITHOUT CONTRAST CT MAXILLOFACIAL WITHOUT CONTRAST CT CERVICAL SPINE WITHOUT CONTRAST TECHNIQUE: Multidetector CT imaging of the head, cervical spine, and maxillofacial structures were performed using the standard protocol without intravenous contrast. Multiplanar CT image reconstructions of the cervical spine and maxillofacial structures were also generated. COMPARISON:  None. FINDINGS: CT HEAD FINDINGS Brain: No evidence of acute infarction, hemorrhage, hydrocephalus, extra-axial collection or mass lesion/mass effect. Vascular: No hyperdense vessel or unexpected calcification. Skull: Normal. Negative for fracture or focal lesion. Other: None. CT MAXILLOFACIAL FINDINGS Osseous: No fracture or mandibular dislocation. No destructive process. Orbits: Negative. No traumatic or inflammatory finding. Sinuses: Mild bilateral ethmoid and maxillary sinusitis is noted. Soft tissues: Negative. CT CERVICAL SPINE FINDINGS Alignment: Normal. Skull base and vertebrae: No acute fracture. No primary bone lesion or focal pathologic process. Soft tissues and spinal canal: No prevertebral fluid or swelling. No visible canal hematoma. Disc levels:  Normal.  Upper chest: Negative. Other: None. IMPRESSION: Normal head CT. Mild bilateral ethmoid and maxillary sinusitis. No other abnormality seen in maxillofacial region. Normal cervical spine. Electronically Signed   By: Lupita Raider, M.D.   On: 03/10/2017 15:34   Dg Femur Min 2 Views Left  Result Date: 03/10/2017 CLINICAL DATA:  Fall EXAM: LEFT FEMUR 2 VIEWS COMPARISON:  None. FINDINGS: There is no evidence of fracture or other focal bone lesions. Soft tissues are unremarkable. IMPRESSION: Normal left hip and femur. Electronically Signed   By: Deatra Robinson M.D.   On: 03/10/2017 15:14   Dg Femur Min 2 Views Right  Result Date: 03/10/2017 CLINICAL DATA:  Fall, tenderness/swelling EXAM: RIGHT FEMUR 2 VIEWS COMPARISON:  None. FINDINGS: No fracture or  dislocation is seen. The joint spaces are preserved. Visualized soft tissues are within normal limits. IMPRESSION: Negative. Electronically Signed   By: Charline Bills M.D.   On: 03/10/2017 15:11   Ct Maxillofacial Wo Contrast  Result Date: 03/10/2017 CLINICAL DATA:  Posttraumatic headache and right-sided facial injury after multiple falls last night. EXAM: CT HEAD WITHOUT CONTRAST CT MAXILLOFACIAL WITHOUT CONTRAST CT CERVICAL SPINE WITHOUT CONTRAST TECHNIQUE: Multidetector CT imaging of the head, cervical spine, and maxillofacial structures were performed using the standard protocol without intravenous contrast. Multiplanar CT image reconstructions of the cervical spine and maxillofacial structures were also generated. COMPARISON:  None. FINDINGS: CT HEAD FINDINGS Brain: No evidence of acute infarction, hemorrhage, hydrocephalus, extra-axial collection or mass lesion/mass effect. Vascular: No hyperdense vessel or unexpected calcification. Skull: Normal. Negative for fracture or focal lesion. Other: None. CT MAXILLOFACIAL FINDINGS Osseous: No fracture or mandibular dislocation. No destructive process. Orbits: Negative. No traumatic or inflammatory finding. Sinuses: Mild bilateral ethmoid and maxillary sinusitis is noted. Soft tissues: Negative. CT CERVICAL SPINE FINDINGS Alignment: Normal. Skull base and vertebrae: No acute fracture. No primary bone lesion or focal pathologic process. Soft tissues and spinal canal: No prevertebral fluid or swelling. No visible canal hematoma. Disc levels:  Normal. Upper chest: Negative. Other: None. IMPRESSION: Normal head CT. Mild bilateral ethmoid and maxillary sinusitis. No other abnormality seen in maxillofacial region. Normal cervical spine. Electronically Signed   By: Lupita Raider, M.D.   On: 03/10/2017 15:34    Procedures Procedures (including critical care time)  Medications Ordered in ED Medications  LORazepam (ATIVAN) injection 0-4 mg ( Intravenous See  Alternative 03/11/17 0247)    Or  LORazepam (ATIVAN) tablet 0-4 mg (0 mg Oral Not Given 03/11/17 0247)  LORazepam (ATIVAN) injection 0-4 mg (not administered)    Or  LORazepam (ATIVAN) tablet 0-4 mg (not administered)  thiamine (VITAMIN B-1) tablet 100 mg (100 mg Oral Given 03/10/17 1627)    Or  thiamine (B-1) injection 100 mg ( Intravenous See Alternative 03/10/17 1627)  pantoprazole (PROTONIX) EC tablet 40 mg (not administered)  sodium chloride 0.9 % bolus 1,000 mL (0 mLs Intravenous Stopped 03/10/17 1849)  ondansetron (ZOFRAN) injection 4 mg (4 mg Intravenous Given 03/10/17 1626)     Initial Impression / Assessment and Plan / ED Course  I have reviewed the triage vital signs and the nursing notes.  Pertinent labs & imaging results that were available during my care of the patient were reviewed by me and considered in my medical decision making (see chart for details).  Clinical Course as of Mar 11 534  Tue Mar 10, 2017  1425 Spoke with [employer], Neville.  Recommends EKG, then repeat at 6pm.  Would need 24 hour obs from  time of possible ingestion.  Benzos or phenobarb for seizures.  If he has conduction delays, they need a call back.  If asymptomatic until 24 hours from ingestion, can likely be medically cleared.   [SJ]    Clinical Course User Index [SJ] Toye Rouillard C, PA-C   Patient presents initially with complaint of head injury occurring yesterday afternoon.  Additional complaints of suicidal ideations and tremor with lightheadedness, which give the additional concern for possible minor alcohol withdrawal.  Due to the patient's grandmother finding the empty pill bottles around 6 PM, we will use 6 PM as the marker for our 24-hour observation endpoint.  End of shift patient care handoff report given to [facility], PA-C. Plan: Patient's labs pending, review for abnormalities and respond accordingly.  Repeat EKG around 6 PM.  Once patient is medically clear, consult  TTS.   Final Clinical Impressions(s) / ED Diagnoses   Final diagnoses:  Suicidal ideation  Intentional drug overdose, initial encounter [facility])    ED Discharge Orders    None       Concepcion Living 03/11/17 0553    Shaune Pollack, MD 03/11/17 [phone]
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
## 2017-03-10 NOTE — ED Notes (Signed)
Per poison control pt will need to be monitor until after 6 pm. Pt is alert and oriented x 4 and is verbally responsive. Pt is cooperative. Pt grandmother is at bedside.

## 2017-03-10 NOTE — ED Provider Notes (Signed)
Care assumed from previous provider PA Joy. Please see note for further details. Briefly, patient is a 22 y.o. male who presented to ED for intentional overdose / suicide attempt. Case discussed, plan agreed upon. Will follow up on pending labs / urine. Observe until 6 pm. At that point, will repeat EKG. When medically cleared, will also need TTS evaluation.   Labs reviewed and reassuring. Leukocytosis of 13.3. UDS + for bz's, cocaine and thc. UA negative.   Repeat EKG reassuring with no change in QT/QRS. Patient tolerating PO, independently ambulatory. Feels back to baseline. Medically cleared with disposition per TTS recommendations.    Margit Batte, Chase Picket, PA-C 03/10/17 1921    Jacalyn Lefevre, MD 03/10/17 [phone]

## 2017-03-11 DIAGNOSIS — Z811 Family history of alcohol abuse and dependence: Secondary | ICD-10-CM | POA: Diagnosis not present

## 2017-03-11 DIAGNOSIS — F419 Anxiety disorder, unspecified: Secondary | ICD-10-CM | POA: Diagnosis not present

## 2017-03-11 DIAGNOSIS — R4587 Impulsiveness: Secondary | ICD-10-CM | POA: Diagnosis not present

## 2017-03-11 DIAGNOSIS — T43224A Poisoning by selective serotonin reuptake inhibitors, undetermined, initial encounter: Secondary | ICD-10-CM

## 2017-03-11 DIAGNOSIS — F1490 Cocaine use, unspecified, uncomplicated: Secondary | ICD-10-CM | POA: Diagnosis not present

## 2017-03-11 DIAGNOSIS — F1290 Cannabis use, unspecified, uncomplicated: Secondary | ICD-10-CM

## 2017-03-11 DIAGNOSIS — F1910 Other psychoactive substance abuse, uncomplicated: Secondary | ICD-10-CM

## 2017-03-11 DIAGNOSIS — T424X4A Poisoning by benzodiazepines, undetermined, initial encounter: Secondary | ICD-10-CM | POA: Diagnosis not present

## 2017-03-11 DIAGNOSIS — R450 Nervousness: Secondary | ICD-10-CM

## 2017-03-11 NOTE — ED Notes (Signed)
Aunt into see

## 2017-03-11 NOTE — Patient Outreach (Signed)
ED Peer Support Specialist Patient Intake (Complete at intake & 30-60 Day Follow-up)  Name: Corey Rivera  MRN: [id number]  Age: 22 y.o.   Date of Admission: 03/11/2017  Intake: Initial Comments:      Primary Reason Admitted: anxiety, depression, SI, poly substance use alcohol, cocaine, marijuana, and opiates  Lab values: Alcohol/ETOH: Negative Positive UDS? Yes Amphetamines: No Barbiturates: No Benzodiazepines: Yes Cocaine: Yes Opiates: No Cannabinoids: Yes  Demographic information: Gender: Male Ethnicity: White Marital Status: Single Insurance Status: Facilities manager (Work Engineer, agricultural, Sales executive, etc.: No Lives with: Alone Living situation: House/Apartment  Reported Patient History: Patient reported health conditions: Depression(Anxiety, panic attacks) Patient aware of HIV and hepatitis status: No  In past year, has patient visited ED for any reason? No  Number of ED visits:    Reason(s) for visit:    In past year, has patient been hospitalized for any reason? No  Number of hospitalizations:    Reason(s) for hospitalization:    In past year, has patient been arrested? No  Number of arrests:    Reason(s) for arrest:    In past year, has patient been incarcerated? No  Number of incarcerations:    Reason(s) for incarceration:    In past year, has patient received medication-assisted treatment? No  In past year, patient received the following treatments:    In past year, has patient received any harm reduction services? No  Did this include any of the following?    In past year, has patient received care from a mental health provider for diagnosis other than SUD? Yes(lexapro for depression, alprazolam for panic attacks, anxiety)  In past year, is this first time patient has overdosed? (has never overdosed)  Number of past overdoses:    In past year, is this first time patient has been hospitalized for an  overdose? (has never overdosed)  Number of hospitalizations for overdose(s):    Is patient currently receiving treatment for a mental health diagnosis? Yes  Patient reports experiencing difficulty participating in SUD treatment: Yes    Most important reason(s) for this difficulty? Other (comment)(has a good job and is working)  Has patient received prior services for treatment? (Has no prioir substance use treatment history)  In past, patient has received services from following agencies:    Plan of Care:  Suggested follow up at these agencies/treatment centers: [facility] ([facility])([facility] provided outpatient substance use treatment resources because the patient also works. [facility] also provided an AA/NA meeting list and [facility] contact information. )  Other information: [facility] encouraged the patient to contact [facility] at anytime for substance use recovery support.    Bartholomew Boards, [facility]  03/11/2017 11:39 AM

## 2017-03-11 NOTE — ED Notes (Signed)
On the phone 

## 2017-03-11 NOTE — BH Assessment (Signed)
[facility] Assessment Progress Note  Per Juanetta Beets, DO, this pt does not require psychiatric hospitalization at this time.  Pt is to be discharged from [facility] with referral information for area Chemical Dependency Intensive Outpatient Programs.  This has been included in pt's discharge instructions.  Pt would also benefit from seeing Peer Support Specialists; they will be asked to speak to pt.  Pt's nurse, Wille Celeste, has been notified.  Doylene Canning, MA Triage Specialist [phone]

## 2017-03-11 NOTE — Consult Note (Signed)
[place] [facility] Consult   Reason for Consult:   Referring Physician:  EDP Patient Identification: Corey Rivera MRN:  [id number] Principal Diagnosis: Polysubstance abuse [facility]) Diagnosis:   Patient Active Problem List   Diagnosis Date Noted  . Anxiety and depression [F41.9, F32.9] 01/24/2017  . Gastroesophageal reflux disease without esophagitis [K21.9] 01/24/2017    Total Time spent with patient: 45 minutes  Subjective:   Corey Rivera is a 22 y.o. male patient admitted with suicidal ideation.  HPI:  Pt was seen and chart reviewed with treatment team and Dr Mariea Clonts. Pt stated he was not trying to hurt himself, he was just drinking and took too many of his Lexapro and Xanax. Pt stated he doesn't remember how many pills he took. Today, Pt denies suicidal/homicidal ideation, denies auditory/visual hallucinations and does not appear to be responding to internal stimuli. 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 Pt will be seen by Peer Support for assistance with substance abuse resources in the community and will be given outpatient resources upon discharge. Pt is able to contract for safety. Pt is psychiatrically clear for discharge.  Past Psychiatric History: As above  Risk to Self: None Risk to Others: None Prior Inpatient Therapy: Prior Inpatient Therapy: No Prior Therapy Dates: NA Prior Therapy Facilty/Provider(s): NA Reason for Treatment: NA Prior Outpatient Therapy: Prior Outpatient Therapy: No Prior Therapy Dates: NA Prior Therapy Facilty/Provider(s): NA Reason for Treatment: NA Does patient have an ACCT team?: No Does patient have Intensive In-House Services?  : No Does patient have [facility] services? : No Does patient have [facility] services?: No  Past Medical History:  Past Medical History:  Diagnosis Date  . Allergy   . Depression   . GERD (gastroesophageal reflux disease)   . History of chickenpox     Past Surgical History:   Procedure Laterality Date  . NO PAST SURGERIES     Family History:  Family History  Problem Relation Age of Onset  . Alcohol abuse Father   . Alcohol abuse Brother   . Asthma Brother   . Diabetes Brother   . Arthritis Maternal Grandmother   . Asthma Maternal Grandmother   . COPD Maternal Grandmother   . Alcohol abuse Maternal Grandfather   . Alcohol abuse Paternal Grandfather   . Heart attack Paternal Grandfather   . Hyperlipidemia Paternal Grandfather    Family Psychiatric  History: Extensive family history of alcohol abuse.  Social History:  Social History   Substance and Sexual Activity  Alcohol Use Yes   Comment: daily-one fifth     Social History   Substance and Sexual Activity  Drug Use Yes  . Types: Cocaine, Marijuana    Social History   Socioeconomic History  . Marital status: Married    Spouse name: None  . Number of children: None  . Years of education: None  . Highest education level: None  Social Needs  . Financial resource strain: None  . Food insecurity - worry: None  . Food insecurity - inability: None  . Transportation needs - medical: None  . Transportation needs - non-medical: None  Occupational History  . None  Tobacco Use  . Smoking status: Smoker, Current Status Unknown    Packs/day: 0.50    Types: Cigarettes  . Smokeless tobacco: Never Used  Substance and Sexual Activity  . Alcohol use: Yes    Comment: daily-one fifth  . Drug use: Yes    Types: Cocaine, Marijuana  .  Sexual activity: Yes  Other Topics Concern  . None  Social History Narrative  . None   Additional Social History: N/A    Allergies:  No Known Allergies  Labs:  Results for orders placed or performed during the hospital encounter of 03/10/17 (from the past 48 hour(s))  Comprehensive metabolic panel     Status: Abnormal   Collection Time: 03/10/17  3:32 PM  Result Value Ref Range   Sodium 137 135 - 145 mmol/L   Potassium 4.1 3.5 - 5.1 mmol/L   Chloride 99  (L) 101 - 111 mmol/L   CO2 29 22 - 32 mmol/L   Glucose, Bld 104 (H) 65 - 99 mg/dL   BUN 7 6 - 20 mg/dL   Creatinine, Ser 1.12 0.61 - 1.24 mg/dL   Calcium 9.7 8.9 - 10.3 mg/dL   Total Protein 8.1 6.5 - 8.1 g/dL   Albumin 5.2 (H) 3.5 - 5.0 g/dL   AST 33 15 - 41 U/L   ALT 19 17 - 63 U/L   Alkaline Phosphatase 77 38 - 126 U/L   Total Bilirubin 0.4 0.3 - 1.2 mg/dL   GFR calc non Af Amer >60 >60 mL/min   GFR calc Af Amer >60 >60 mL/min    Comment: (NOTE) The eGFR has been calculated using the CKD EPI equation. This calculation has not been validated in all clinical situations. eGFR's persistently <60 mL/min signify possible Chronic Kidney Disease.    Anion gap 9 5 - 15  Ethanol     Status: None   Collection Time: 03/10/17  3:32 PM  Result Value Ref Range   Alcohol, Ethyl (B) <10 <10 mg/dL    Comment:        LOWEST DETECTABLE LIMIT FOR SERUM ALCOHOL IS 10 mg/dL FOR MEDICAL PURPOSES ONLY   CBC with Differential     Status: Abnormal   Collection Time: 03/10/17  3:32 PM  Result Value Ref Range   WBC 13.3 (H) 4.0 - 10.5 K/uL   RBC 5.05 4.22 - 5.81 MIL/uL   Hemoglobin 16.5 13.0 - 17.0 g/dL   HCT 46.7 39.0 - 52.0 %   MCV 92.5 78.0 - 100.0 fL   MCH 32.7 26.0 - 34.0 pg   MCHC 35.3 30.0 - 36.0 g/dL   RDW 13.4 11.5 - 15.5 %   Platelets 216 150 - 400 K/uL   Neutrophils Relative % 82 %   Neutro Abs 10.9 (H) 1.7 - 7.7 K/uL   Lymphocytes Relative 10 %   Lymphs Abs 1.4 0.7 - 4.0 K/uL   Monocytes Relative 7 %   Monocytes Absolute 1.0 0.1 - 1.0 K/uL   Eosinophils Relative 1 %   Eosinophils Absolute 0.1 0.0 - 0.7 K/uL   Basophils Relative 0 %   Basophils Absolute 0.0 0.0 - 0.1 K/uL  Salicylate level     Status: None   Collection Time: 03/10/17  3:32 PM  Result Value Ref Range   Salicylate Lvl <3.3 2.8 - 30.0 mg/dL  Acetaminophen level     Status: Abnormal   Collection Time: 03/10/17  3:32 PM  Result Value Ref Range   Acetaminophen (Tylenol), Serum <10 (L) 10 - 30 ug/mL    Comment:         THERAPEUTIC CONCENTRATIONS VARY SIGNIFICANTLY. A RANGE OF 10-30 ug/mL MAY BE AN EFFECTIVE CONCENTRATION FOR MANY PATIENTS. HOWEVER, SOME ARE BEST TREATED AT CONCENTRATIONS OUTSIDE THIS RANGE. ACETAMINOPHEN CONCENTRATIONS >150 ug/mL AT 4 HOURS AFTER INGESTION AND >50 ug/mL AT  12 HOURS AFTER INGESTION ARE OFTEN ASSOCIATED WITH TOXIC REACTIONS.   Urinalysis, Routine w reflex microscopic     Status: None   Collection Time: 03/10/17  3:51 PM  Result Value Ref Range   Color, Urine YELLOW YELLOW   APPearance CLEAR CLEAR   Specific Gravity, Urine 1.020 1.005 - 1.030   pH 7.0 5.0 - 8.0   Glucose, UA NEGATIVE NEGATIVE mg/dL   Hgb urine dipstick NEGATIVE NEGATIVE   Bilirubin Urine NEGATIVE NEGATIVE   Ketones, ur NEGATIVE NEGATIVE mg/dL   Protein, ur NEGATIVE NEGATIVE mg/dL   Nitrite NEGATIVE NEGATIVE   Leukocytes, UA NEGATIVE NEGATIVE  Urine rapid drug screen (hosp performed)     Status: Abnormal   Collection Time: 03/10/17  3:51 PM  Result Value Ref Range   Opiates NONE DETECTED NONE DETECTED   Cocaine POSITIVE (A) NONE DETECTED   Benzodiazepines POSITIVE (A) NONE DETECTED   Amphetamines NONE DETECTED NONE DETECTED   Tetrahydrocannabinol POSITIVE (A) NONE DETECTED   Barbiturates NONE DETECTED NONE DETECTED    Comment: (NOTE) DRUG SCREEN FOR MEDICAL PURPOSES ONLY.  IF CONFIRMATION IS NEEDED FOR ANY PURPOSE, NOTIFY LAB WITHIN 5 DAYS. LOWEST DETECTABLE LIMITS FOR URINE DRUG SCREEN Drug Class                     Cutoff (ng/mL) Amphetamine and metabolites    1000 Barbiturate and metabolites    200 Benzodiazepine                 275 Tricyclics and metabolites     300 Opiates and metabolites        300 Cocaine and metabolites        300 THC                            50     Current Facility-Administered Medications  Medication Dose Route Frequency Provider Last Rate Last Dose  . LORazepam (ATIVAN) injection 0-4 mg  0-4 mg Intravenous Q6H Joy, Shawn C, PA-C       Or  .  LORazepam (ATIVAN) tablet 0-4 mg  0-4 mg Oral Q6H Joy, Shawn C, PA-C   2 mg at 03/11/17 1700  . [START ON 03/12/2017] LORazepam (ATIVAN) injection 0-4 mg  0-4 mg Intravenous Q12H Joy, Shawn C, PA-C       Or  . [START ON 03/12/2017] LORazepam (ATIVAN) tablet 0-4 mg  0-4 mg Oral Q12H Joy, Shawn C, PA-C      . pantoprazole (PROTONIX) EC tablet 40 mg  40 mg Oral Daily Ward, Ozella Almond, PA-C   40 mg at 03/11/17 1016  . thiamine (VITAMIN B-1) tablet 100 mg  100 mg Oral Daily Joy, Shawn C, PA-C   100 mg at 03/11/17 1016   Or  . thiamine (B-1) injection 100 mg  100 mg Intravenous Daily Joy, Shawn C, PA-C       Current Outpatient Medications  Medication Sig Dispense Refill  . ALPRAZolam (XANAX) 0.5 MG tablet Take 0.5 mg by mouth at bedtime as needed. FOR ACUTE ANXIETY  0  . escitalopram (LEXAPRO) 20 MG tablet Take 1 tablet (20 mg total) by mouth daily. 30 tablet 1  . pantoprazole (PROTONIX) 40 MG tablet Take 1 tablet (40 mg total) by mouth daily. 30 tablet 3    Musculoskeletal: Strength & Muscle Tone: within normal limits Gait & Station: normal Patient leans: N/A  Psychiatric Specialty Exam: Physical Exam  Nursing note and vitals reviewed. Constitutional: He is oriented to person, place, and time. He appears well-developed and well-nourished.  HENT:  Head: Normocephalic and atraumatic.  Neck: Normal range of motion.  Respiratory: Effort normal.  Musculoskeletal: Normal range of motion.  Neurological: He is alert and oriented to person, place, and time.  Psychiatric: He has a normal mood and affect. His speech is normal and behavior is normal. Thought content normal. Cognition and memory are normal. He expresses impulsivity.    Review of Systems  Psychiatric/Behavioral: Positive for depression and substance abuse. Negative for hallucinations, memory loss and suicidal ideas. The patient is nervous/anxious. The patient does not have insomnia.   All other systems reviewed and are negative.    Blood pressure (!) 143/99, pulse 90, temperature 98.2 F (36.8 C), resp. rate 18, weight 79.4 kg (175 lb), SpO2 99 %.Body mass index is 24.41 kg/m.  General Appearance: Casual  Eye Contact:  Good  Speech:  Clear and Coherent  Volume:  Decreased  Mood:  Euthymic  Affect:  Congruent  Thought Process:  Coherent, Goal Directed and Linear  Orientation:  Full (Time, Place, and Person)  Thought Content:  Logical  Suicidal Thoughts:  No  Homicidal Thoughts:  No  Memory:  Immediate;   Good Recent;   Good Remote;   Fair  Judgement:  Poor  Insight:  Fair  Psychomotor Activity:  Normal  Concentration:  Concentration: Good and Attention Span: Fair  Recall:  Good  Fund of Knowledge:  Good  Language:  Good  Akathisia:  No  Handed:  Right  AIMS (if indicated):    N/A  Assets:  Communication Skills Desire for Improvement Financial Resources/Insurance Housing Physical Health Social Support Vocational/Educational  ADL's:  Intact  Cognition:  WNL  Sleep:    N/A     Treatment Plan Summary: Plan Anxiety and depression  Discharge Home Follow up with [facility] and Peer Support Take all medications as prescribed Avoid the use of alcohol and illicit drugs  Disposition: No evidence of imminent risk to self or others at present.   Patient does not meet criteria for psychiatric inpatient admission. Supportive therapy provided about ongoing stressors.  Ethelene Hal, NP 03/11/2017 10:42 AM   Patient seen face-to-face for psychiatric evaluation, chart reviewed and case discussed with the physician extender and developed treatment plan. Reviewed the information documented and agree with the treatment plan.  Buford Dresser, DO

## 2017-03-11 NOTE — Discharge Instructions (Signed)
To help you maintain a sober lifestyle, a substance abuse treatment program may be beneficial to you.  The following providers offer Chemical Dependency Intensive Outpatient Programs.  These programs meet several hours a day, several times a week.  Contact them at your earliest opportunity to ask about enrolling:        [facility]. [employer]. [address]      [place], [place] [address]      [phone]       [facility]      [address] [place], [place] [address]      [phone]

## 2017-03-11 NOTE — ED Notes (Signed)
Pt ambulatory w/o difficulty to dc area with mHt.  Grandmother with pt, belongings returned after leaving the area.

## 2017-03-11 NOTE — [facility] Suicide Risk Assessment (Signed)
Suicide Risk Assessment  Discharge Assessment   [facility] Discharge Suicide Risk Assessment   Principal Problem: Anxiety and depression Discharge Diagnoses:  Patient Active Problem List   Diagnosis Date Noted  . Anxiety and depression [F41.9, F32.9] 01/24/2017  . Gastroesophageal reflux disease without esophagitis [K21.9] 01/24/2017    Total Time spent with patient: 45 minutes  Musculoskeletal: Strength & Muscle Tone: within normal limits Gait & Station: normal Patient leans: N/A  Psychiatric Specialty Exam: Physical Exam  Constitutional: He is oriented to person, place, and time. He appears well-developed and well-nourished.  HENT:  Head: Normocephalic.  Respiratory: Effort normal.  Musculoskeletal: Normal range of motion.  Neurological: He is alert and oriented to person, place, and time.  Psychiatric: His speech is normal and behavior is normal. Thought content normal. His mood appears anxious. Cognition and memory are normal. He expresses impulsivity.   Review of Systems  Psychiatric/Behavioral: Positive for depression and substance abuse. Negative for hallucinations, memory loss and suicidal ideas. The patient is nervous/anxious. The patient does not have insomnia.   All other systems reviewed and are negative.  Blood pressure (!) 143/99, pulse 90, temperature 98.2 F (36.8 C), resp. rate 18, weight 79.4 kg (175 lb), SpO2 99 %.Body mass index is 24.41 kg/m. General Appearance: Casual Eye Contact:  Good Speech:  Clear and Coherent Volume:  Decreased Mood:  Euthymic Affect:  Congruent Thought Process:  Coherent, Goal Directed and Linear Orientation:  Full (Time, Place, and Person) Thought Content:  Logical Suicidal Thoughts:  No Homicidal Thoughts:  No Memory:  Immediate;   Good Recent;   Good Remote;   Fair Judgement:  Poor Insight:  Fair Psychomotor Activity:  Normal Concentration:  Concentration: Good and Attention Span: Fair Recall:  Good Fund of Knowledge:   Good Language:  Good Akathisia:  No Handed:  Right AIMS (if indicated):    Assets:  Communication Skills Desire for Improvement Financial Resources/Insurance Housing Physical Health Social Support Vocational/Educational ADL's:  Intact Cognition:  WNL   Mental Status Per Nursing Assessment::   On Admission:   suicidal ideation  Demographic Factors:  Male, Adolescent or young adult and Caucasian  Loss Factors: NA  Historical Factors: Impulsivity  Risk Reduction Factors:   Sense of responsibility to family and Employed  Continued Clinical Symptoms:  Depression:   Impulsivity Alcohol/Substance Abuse/Dependencies  Cognitive Features That Contribute To Risk:  Closed-mindedness    Suicide Risk:  Minimal: No identifiable suicidal ideation.  Patients presenting with no risk factors but with morbid ruminations; may be classified as minimal risk based on the severity of the depressive symptoms    Plan Of Care/Follow-up recommendations:  Activity:  as tolerated Diet:  Heart Healthy  Laveda Abbe, NP 03/11/2017, 10:57 AM

## 2017-03-11 NOTE — ED Notes (Addendum)
Written dc instructions reviewed with patient.  Pt encouraged to contact OP providers for follow up treatment, and to contact his primary care MD concerning his medications.  Pt also encouraged to seek treatment/return for any suicidal thoughts/urges, or N/V/changes in vision, HA's or other concerns.  Patient verbalized understanding and reported he would do so.

## 2017-03-11 NOTE — ED Notes (Signed)
Pt's grandmother is here and will drive himi home, pt has finished lunch

## 2017-03-17 ENCOUNTER — Encounter: Payer: Self-pay | Admitting: Physician Assistant

## 2017-03-17 ENCOUNTER — Ambulatory Visit: Payer: Managed Care, Other (non HMO) | Admitting: Physician Assistant

## 2017-03-17 ENCOUNTER — Other Ambulatory Visit: Payer: Self-pay

## 2017-03-17 VITALS — BP 104/70 | HR 79 | Temp 98.0°F | Resp 14 | Ht 71.0 in | Wt 176.0 lb

## 2017-03-17 DIAGNOSIS — F419 Anxiety disorder, unspecified: Secondary | ICD-10-CM | POA: Diagnosis not present

## 2017-03-17 DIAGNOSIS — F1910 Other psychoactive substance abuse, uncomplicated: Secondary | ICD-10-CM

## 2017-03-17 DIAGNOSIS — F329 Major depressive disorder, single episode, unspecified: Secondary | ICD-10-CM

## 2017-03-17 MED ORDER — BUSPIRONE HCL 5 MG PO TABS: 5.0000 mg | ORAL_TABLET | Freq: Two times a day (BID) | ORAL | 1 refills | Status: DC

## 2017-03-17 NOTE — Progress Notes (Signed)
Patient presents to clinic today for ER follow-up. Patient was taken to the ER on 03/10/2017 by family with c/o intentional overdose and substance abuse. ER workup included UDS + for benzodiazepines, marijuana and cocaine. Multiple CT scans obtained without acute or concerning findings. Patient was hydrated and allowed to stabilize before being cleared medically and by psychiatry to be discharged home  Since discharge patient endorses avoiding any of his friends that partake in alcohol and recreational drug use. Denies any significant depressed mood, anhedonia, SI/HI. Is having considerable anxiety, especially after losing his job yesterday. Has not taken any of his Lexapro since ER assessment.  Past Medical History:  Diagnosis Date  . Allergy   . Depression   . GERD (gastroesophageal reflux disease)   . History of chickenpox     Current Outpatient Medications on File Prior to Visit  Medication Sig Dispense Refill  . pantoprazole (PROTONIX) 40 MG tablet Take 1 tablet (40 mg total) by mouth daily. 30 tablet 3  . escitalopram (LEXAPRO) 20 MG tablet Take 1 tablet (20 mg total) by mouth daily. (Patient not taking: Reported on 03/17/2017) 30 tablet 1   No current facility-administered medications on file prior to visit.     No Known Allergies  Family History  Problem Relation Age of Onset  . Alcohol abuse Father   . Alcohol abuse Brother   . Asthma Brother   . Diabetes Brother   . Arthritis Maternal Grandmother   . Asthma Maternal Grandmother   . COPD Maternal Grandmother   . Alcohol abuse Maternal Grandfather   . Alcohol abuse Paternal Grandfather   . Heart attack Paternal Grandfather   . Hyperlipidemia Paternal Grandfather     Social History   Socioeconomic History  . Marital status: Married    Spouse name: None  . Number of children: None  . Years of education: None  . Highest education level: None  Social Needs  . Financial resource strain: None  . Food insecurity -  worry: None  . Food insecurity - inability: None  . Transportation needs - medical: None  . Transportation needs - non-medical: None  Occupational History  . None  Tobacco Use  . Smoking status: Smoker, Current Status Unknown    Packs/day: 0.50    Types: Cigarettes  . Smokeless tobacco: Never Used  Substance and Sexual Activity  . Alcohol use: Yes    Comment: daily-one fifth  . Drug use: Yes    Types: Cocaine, Marijuana  . Sexual activity: Yes  Other Topics Concern  . None  Social History Narrative  . None   Review of Systems - See HPI.  All other ROS are negative.  BP 104/70   Pulse 79   Temp 98 F (36.7 C) (Oral)   Resp 14   Ht 5' 11"  (1.803 m)   Wt 176 lb (79.8 kg)   SpO2 99%   BMI 24.55 kg/m   Physical Exam  Constitutional: He is oriented to person, place, and time and well-developed, well-nourished, and in no distress.  HENT:  Head: Normocephalic and atraumatic.  Eyes: Conjunctivae are normal.  Neck: Neck supple.  Cardiovascular: Normal rate, regular rhythm, normal heart sounds and intact distal pulses.  Pulmonary/Chest: Effort normal and breath sounds normal. No respiratory distress. He has no wheezes. He has no rales. He exhibits no tenderness.  Neurological: He is alert and oriented to person, place, and time.  Skin: Skin is warm and dry. No rash noted.  Psychiatric: His mood  appears anxious. His affect is not blunt and not inappropriate. He is not agitated. He does not express impulsivity. He exhibits a depressed mood. He expresses no homicidal and no suicidal ideation. He expresses no suicidal plans and no homicidal plans. He is not apathetic and not concerned with wish fulfillment. He exhibits ordered thought content. He does not have a flat affect.  Vitals reviewed.  Recent Results (from the past 2160 hour(s))  Comp Met (CMET)     Status: Abnormal   Collection Time: 01/23/17  2:59 PM  Result Value Ref Range   Sodium 137 135 - 145 mEq/L   Potassium 4.0  3.5 - 5.1 mEq/L   Chloride 101 96 - 112 mEq/L   CO2 28 19 - 32 mEq/L   Glucose, Bld 111 (H) 70 - 99 mg/dL   BUN 11 6 - 23 mg/dL   Creatinine, Ser 1.08 0.40 - 1.50 mg/dL   Total Bilirubin 0.8 0.2 - 1.2 mg/dL   Alkaline Phosphatase 60 52 - 171 U/L   AST 23 0 - 37 U/L   ALT 20 0 - 53 U/L   Total Protein 7.8 6.0 - 8.3 g/dL   Albumin 5.4 (H) 3.5 - 5.2 g/dL   Calcium 10.5 8.4 - 10.5 mg/dL   GFR 93.02 >60.00 mL/min  TSH     Status: None   Collection Time: 01/23/17  2:59 PM  Result Value Ref Range   TSH 1.86 0.40 - 5.00 uIU/mL  CBC     Status: None   Collection Time: 01/23/17  2:59 PM  Result Value Ref Range   WBC 9.9 4.5 - 13.5 K/uL   RBC 4.87 3.80 - 5.70 Mil/uL   Platelets 279.0 150.0 - 575.0 K/uL   Hemoglobin 15.4 12.0 - 16.0 g/dL   HCT 45.2 36.0 - 49.0 %   MCV 92.8 78.0 - 98.0 fl   MCHC 34.2 31.0 - 37.0 g/dL   RDW 13.3 11.4 - 15.5 %  Comprehensive metabolic panel     Status: Abnormal   Collection Time: 03/10/17  3:32 PM  Result Value Ref Range   Sodium 137 135 - 145 mmol/L   Potassium 4.1 3.5 - 5.1 mmol/L   Chloride 99 (L) 101 - 111 mmol/L   CO2 29 22 - 32 mmol/L   Glucose, Bld 104 (H) 65 - 99 mg/dL   BUN 7 6 - 20 mg/dL   Creatinine, Ser 1.12 0.61 - 1.24 mg/dL   Calcium 9.7 8.9 - 10.3 mg/dL   Total Protein 8.1 6.5 - 8.1 g/dL   Albumin 5.2 (H) 3.5 - 5.0 g/dL   AST 33 15 - 41 U/L   ALT 19 17 - 63 U/L   Alkaline Phosphatase 77 38 - 126 U/L   Total Bilirubin 0.4 0.3 - 1.2 mg/dL   GFR calc non Af Amer >60 >60 mL/min   GFR calc Af Amer >60 >60 mL/min    Comment: (NOTE) The eGFR has been calculated using the CKD EPI equation. This calculation has not been validated in all clinical situations. eGFR's persistently <60 mL/min signify possible Chronic Kidney Disease.    Anion gap 9 5 - 15  Ethanol     Status: None   Collection Time: 03/10/17  3:32 PM  Result Value Ref Range   Alcohol, Ethyl (B) <10 <10 mg/dL    Comment:        LOWEST DETECTABLE LIMIT FOR SERUM ALCOHOL IS  10 mg/dL FOR MEDICAL PURPOSES ONLY   CBC with  Differential     Status: Abnormal   Collection Time: 03/10/17  3:32 PM  Result Value Ref Range   WBC 13.3 (H) 4.0 - 10.5 K/uL   RBC 5.05 4.22 - 5.81 MIL/uL   Hemoglobin 16.5 13.0 - 17.0 g/dL   HCT 46.7 39.0 - 52.0 %   MCV 92.5 78.0 - 100.0 fL   MCH 32.7 26.0 - 34.0 pg   MCHC 35.3 30.0 - 36.0 g/dL   RDW 13.4 11.5 - 15.5 %   Platelets 216 150 - 400 K/uL   Neutrophils Relative % 82 %   Neutro Abs 10.9 (H) 1.7 - 7.7 K/uL   Lymphocytes Relative 10 %   Lymphs Abs 1.4 0.7 - 4.0 K/uL   Monocytes Relative 7 %   Monocytes Absolute 1.0 0.1 - 1.0 K/uL   Eosinophils Relative 1 %   Eosinophils Absolute 0.1 0.0 - 0.7 K/uL   Basophils Relative 0 %   Basophils Absolute 0.0 0.0 - 0.1 K/uL  Salicylate level     Status: None   Collection Time: 03/10/17  3:32 PM  Result Value Ref Range   Salicylate Lvl <2.5 2.8 - 30.0 mg/dL  Acetaminophen level     Status: Abnormal   Collection Time: 03/10/17  3:32 PM  Result Value Ref Range   Acetaminophen (Tylenol), Serum <10 (L) 10 - 30 ug/mL    Comment:        THERAPEUTIC CONCENTRATIONS VARY SIGNIFICANTLY. A RANGE OF 10-30 ug/mL MAY BE AN EFFECTIVE CONCENTRATION FOR MANY PATIENTS. HOWEVER, SOME ARE BEST TREATED AT CONCENTRATIONS OUTSIDE THIS RANGE. ACETAMINOPHEN CONCENTRATIONS >150 ug/mL AT 4 HOURS AFTER INGESTION AND >50 ug/mL AT 12 HOURS AFTER INGESTION ARE OFTEN ASSOCIATED WITH TOXIC REACTIONS.   Urinalysis, Routine w reflex microscopic     Status: None   Collection Time: 03/10/17  3:51 PM  Result Value Ref Range   Color, Urine YELLOW YELLOW   APPearance CLEAR CLEAR   Specific Gravity, Urine 1.020 1.005 - 1.030   pH 7.0 5.0 - 8.0   Glucose, UA NEGATIVE NEGATIVE mg/dL   Hgb urine dipstick NEGATIVE NEGATIVE   Bilirubin Urine NEGATIVE NEGATIVE   Ketones, ur NEGATIVE NEGATIVE mg/dL   Protein, ur NEGATIVE NEGATIVE mg/dL   Nitrite NEGATIVE NEGATIVE   Leukocytes, UA NEGATIVE NEGATIVE  Urine rapid  drug screen (hosp performed)     Status: Abnormal   Collection Time: 03/10/17  3:51 PM  Result Value Ref Range   Opiates NONE DETECTED NONE DETECTED   Cocaine POSITIVE (A) NONE DETECTED   Benzodiazepines POSITIVE (A) NONE DETECTED   Amphetamines NONE DETECTED NONE DETECTED   Tetrahydrocannabinol POSITIVE (A) NONE DETECTED   Barbiturates NONE DETECTED NONE DETECTED    Comment: (NOTE) DRUG SCREEN FOR MEDICAL PURPOSES ONLY.  IF CONFIRMATION IS NEEDED FOR ANY PURPOSE, NOTIFY LAB WITHIN 5 DAYS. LOWEST DETECTABLE LIMITS FOR URINE DRUG SCREEN Drug Class                     Cutoff (ng/mL) Amphetamine and metabolites    1000 Barbiturate and metabolites    200 Benzodiazepine                 366 Tricyclics and metabolites     300 Opiates and metabolites        300 Cocaine and metabolites        300 THC  50     Assessment/Plan: 1. Polysubstance abuse ([facility]) Denies any continued use. No controlled medications to be given from this practice. FYI created. Patient seems to do well if he avoids his "friends" that engage in this behavior. Counseling recommended.  2. Anxiety and depression Restart Lexapro. Will start BuSpar 5 mg BID. Close follow-up scheduled. Discussed the significant need for counseling. I am also setting him up with Psychiatry giving depth of symptoms. Denies any suicidal ideation or intent.  - busPIRone (BUSPAR) 5 MG tablet; Take 1 tablet (5 mg total) by mouth 2 (two) times daily.  Dispense: 60 tablet; Refill: [place], [place]

## 2017-03-17 NOTE — Patient Instructions (Signed)
Please restart Lexapro, taking 1/2 tablet daily for 1 week. Then increase to 1 tablet daily. Start BuSpar twice daily. No alcohol with medications.   I want to set you up with Psychiatry. A referral has been placed. Please, please, please reconsider counseling.  I have given you a handout.   Follow-up with me in 2 weeks. Return sooner if needed.  Consider reading a book on Mindfulness meditation. Download the [employer] APP on your phone to help with things.  Never hesitate to call us if you need [place]. We want to help you.

## 2017-03-23 ENCOUNTER — Emergency Department (HOSPITAL_COMMUNITY): Payer: Managed Care, Other (non HMO)

## 2017-03-23 ENCOUNTER — Inpatient Hospital Stay (HOSPITAL_COMMUNITY)
Admission: EM | Admit: 2017-03-23 | Discharge: 2017-03-26 | DRG: 917 | Disposition: A | Discharge status: Home / Self Care | Payer: Managed Care, Other (non HMO) | Attending: Internal Medicine | Admitting: Internal Medicine

## 2017-03-23 ENCOUNTER — Encounter (HOSPITAL_COMMUNITY): Payer: Self-pay | Admitting: Emergency Medicine

## 2017-03-23 DIAGNOSIS — J810 Acute pulmonary edema: Secondary | ICD-10-CM

## 2017-03-23 DIAGNOSIS — D72829 Elevated white blood cell count, unspecified: Secondary | ICD-10-CM | POA: Diagnosis present

## 2017-03-23 DIAGNOSIS — F17210 Nicotine dependence, cigarettes, uncomplicated: Secondary | ICD-10-CM | POA: Diagnosis present

## 2017-03-23 DIAGNOSIS — R0600 Dyspnea, unspecified: Secondary | ICD-10-CM

## 2017-03-23 DIAGNOSIS — F329 Major depressive disorder, single episode, unspecified: Secondary | ICD-10-CM | POA: Diagnosis present

## 2017-03-23 DIAGNOSIS — Z23 Encounter for immunization: Secondary | ICD-10-CM

## 2017-03-23 DIAGNOSIS — Z79899 Other long term (current) drug therapy: Secondary | ICD-10-CM

## 2017-03-23 DIAGNOSIS — F32A Depression, unspecified: Secondary | ICD-10-CM | POA: Diagnosis present

## 2017-03-23 DIAGNOSIS — F419 Anxiety disorder, unspecified: Secondary | ICD-10-CM

## 2017-03-23 DIAGNOSIS — G40409 Other generalized epilepsy and epileptic syndromes, not intractable, without status epilepticus: Secondary | ICD-10-CM | POA: Diagnosis present

## 2017-03-23 DIAGNOSIS — K219 Gastro-esophageal reflux disease without esophagitis: Secondary | ICD-10-CM | POA: Diagnosis present

## 2017-03-23 DIAGNOSIS — T40601A Poisoning by unspecified narcotics, accidental (unintentional), initial encounter: Secondary | ICD-10-CM

## 2017-03-23 DIAGNOSIS — T402X1A Poisoning by other opioids, accidental (unintentional), initial encounter: Secondary | ICD-10-CM | POA: Diagnosis not present

## 2017-03-23 DIAGNOSIS — F1910 Other psychoactive substance abuse, uncomplicated: Secondary | ICD-10-CM | POA: Diagnosis present

## 2017-03-23 DIAGNOSIS — N289 Disorder of kidney and ureter, unspecified: Secondary | ICD-10-CM | POA: Diagnosis present

## 2017-03-23 DIAGNOSIS — J9601 Acute respiratory failure with hypoxia: Secondary | ICD-10-CM | POA: Diagnosis present

## 2017-03-23 DIAGNOSIS — R569 Unspecified convulsions: Secondary | ICD-10-CM

## 2017-03-23 HISTORY — DX: Other psychoactive substance dependence, uncomplicated: F19.20

## 2017-03-23 LAB — COMPREHENSIVE METABOLIC PANEL
ALK PHOS: 73 U/L (ref 38–126)
ALT: 34 U/L (ref 17–63)
ANION GAP: 11 (ref 5–15)
AST: 59 U/L — ABNORMAL HIGH (ref 15–41)
Albumin: 3.9 g/dL (ref 3.5–5.0)
BILIRUBIN TOTAL: 0.4 mg/dL (ref 0.3–1.2)
BUN: 17 mg/dL (ref 6–20)
CALCIUM: 8.7 mg/dL — AB (ref 8.9–10.3)
CO2: 27 mmol/L (ref 22–32)
Chloride: 97 mmol/L — ABNORMAL LOW (ref 101–111)
Creatinine, Ser: 1.38 mg/dL — ABNORMAL HIGH (ref 0.61–1.24)
GFR calc non Af Amer: >60 mL/min (ref >60)
Glucose, Bld: 151 mg/dL — ABNORMAL HIGH (ref 65–99)
POTASSIUM: 4.5 mmol/L (ref 3.5–5.1)
SODIUM: 135 mmol/L (ref 135–145)
TOTAL PROTEIN: 6.3 g/dL — AB (ref 6.5–8.1)

## 2017-03-23 LAB — CBC WITH DIFFERENTIAL/PLATELET
Basophils Absolute: 0 10*3/uL (ref 0.0–0.1)
Basophils Relative: 0 %
EOS ABS: 0.5 10*3/uL (ref 0.0–0.7)
Eosinophils Relative: 3 %
HCT: 44.1 % (ref 39.0–52.0)
HEMOGLOBIN: 14.6 g/dL (ref 13.0–17.0)
LYMPHS ABS: 1.6 10*3/uL (ref 0.7–4.0)
Lymphocytes Relative: 8 %
MCH: 31.7 pg (ref 26.0–34.0)
MCHC: 33.1 g/dL (ref 30.0–36.0)
MCV: 95.9 fL (ref 78.0–100.0)
MONO ABS: 0.5 10*3/uL (ref 0.1–1.0)
MONOS PCT: 2 %
NEUTROS PCT: 87 %
Neutro Abs: 17.9 10*3/uL — ABNORMAL HIGH (ref 1.7–7.7)
Platelets: 210 10*3/uL (ref 150–400)
RBC: 4.6 MIL/uL (ref 4.22–5.81)
RDW: 13.3 % (ref 11.5–15.5)
WBC: 20.5 10*3/uL — ABNORMAL HIGH (ref 4.0–10.5)

## 2017-03-23 LAB — PROTIME-INR
INR: 1
Prothrombin Time: 13.1 seconds (ref 11.4–15.2)

## 2017-03-23 LAB — TYPE AND SCREEN
ABO/RH(D): A NEG
Antibody Screen: NEGATIVE

## 2017-03-23 LAB — ACETAMINOPHEN LEVEL: Acetaminophen (Tylenol), Serum: <10 ug/mL — ABNORMAL LOW (ref 10–30)

## 2017-03-23 LAB — SALICYLATE LEVEL: Salicylate Lvl: <7 mg/dL (ref 2.8–30.0)

## 2017-03-23 LAB — ETHANOL: Alcohol, Ethyl (B): <10 mg/dL (ref <10)

## 2017-03-23 MED ORDER — SODIUM CHLORIDE 0.9 % IV BOLUS (SEPSIS)
1000.0000 mL | Freq: Once | INTRAVENOUS | Status: AC
  Administered 2017-03-23: 1000 mL via INTRAVENOUS

## 2017-03-23 NOTE — ED Triage Notes (Signed)
Patient arrived with EMS from home grand mal seizure episode this evening after snorting Roxicodone , received Narcan 0.5 mg by EMS prior to arrival , alert and oriented at arrival , emesis and mild oral bleeding at arrival.

## 2017-03-23 NOTE — ED Notes (Signed)
Patient placed on a monitor and pulse oximetry , bed rails padded , IV site intact , family at bedside , NRB mask applied due to low O2 sat at arrival 86% 2 lpm/[place] .

## 2017-03-23 NOTE — ED Provider Notes (Signed)
[facility] EMERGENCY DEPARTMENT Provider Note   CSN: [id number] Arrival date & time: 03/23/17  2227     History   Chief Complaint Chief Complaint  Patient presents with  . Seizures    After Snorting Roxicodone    HPI Corey Rivera is a 22 y.o. male.  Patient without significant medical history presents post seizure at home after snorting Roxicodone 30 mg. He was sitting with his brother about 30 minutes after ingestion, passed out, had a seizure of unknown duration, family believes about 10 minutes. Brother performed CPR on him as he did not appear to be breathing post-seizure. On EMS arrival the patient was given Narcan and woke immediately. No vomiting or urinary incontinence. He has been awake and alert since Narcan. He developed a cough with hemoptysis that has persisted and feels SOB. He bit his tongue but there is no bleeding from this and no epistaxis.   The history is provided by the patient, a relative, a parent and the EMS personnel. No language interpreter was used.  Seizures   Associated symptoms include cough. Pertinent negatives include no vomiting.    Past Medical History:  Diagnosis Date  . Allergy   . Depression   . Drug addiction (HCC)   . GERD (gastroesophageal reflux disease)   . History of chickenpox     Patient Active Problem List   Diagnosis Date Noted  . Polysubstance abuse (H[facility])   . Anxiety and depression 01/24/2017  . Gastroesophageal reflux disease without esophagitis 01/24/2017    Past Surgical History:  Procedure Laterality Date  . NO PAST SURGERIES         Home Medications    Prior to Admission medications   Medication Sig Start Date End Date Taking? Authorizing Provider  busPIRone (BUSPAR) 5 MG tablet Take 1 tablet (5 mg total) by mouth 2 (two) times daily. 03/17/17   Waldon Merl, PA-C  escitalopram (LEXAPRO) 20 MG tablet Take 1 tablet (20 mg total) by mouth daily. Patient not taking: Reported on 03/17/2017  02/25/17   Waldon Merl, PA-C  pantoprazole (PROTONIX) 40 MG tablet Take 1 tablet (40 mg total) by mouth daily. 01/23/17   Waldon Merl, PA-C    Family History Family History  Problem Relation Age of Onset  . Alcohol abuse Father   . Alcohol abuse Brother   . Asthma Brother   . Diabetes Brother   . Arthritis Maternal Grandmother   . Asthma Maternal Grandmother   . COPD Maternal Grandmother   . Alcohol abuse Maternal Grandfather   . Alcohol abuse Paternal Grandfather   . Heart attack Paternal Grandfather   . Hyperlipidemia Paternal Grandfather     Social History Social History   Tobacco Use  . Smoking status: Smoker, Current Status Unknown    Packs/day: 0.00    Types: Cigarettes  . Smokeless tobacco: Never Used  Substance Use Topics  . Alcohol use: Yes  . Drug use: Yes    Types: Cocaine, Marijuana    Comment: Roxicodone     Allergies   Patient has no known allergies.   Review of Systems Review of Systems  Constitutional: Negative for chills and fever.  HENT: Positive for mouth sores. Negative for nosebleeds.   Respiratory: Positive for cough and shortness of breath.        Hemoptysis  Cardiovascular: Negative.   Gastrointestinal: Negative.  Negative for vomiting.  Genitourinary: Negative for enuresis.  Musculoskeletal: Negative.   Skin: Negative.  Negative for  wound.  Neurological: Positive for seizures.     Physical Exam Updated Vital Signs There were no vitals taken for this visit.  Physical Exam  Constitutional: He is oriented to person, place, and time. He appears well-developed and well-nourished.  HENT:  Head: Normocephalic.  Mouth/Throat: Oropharynx is clear and moist.  Bite wound to left lateral tongue without bleeding.  Eyes: Conjunctivae are normal.  Neck: Normal range of motion. Neck supple.  Cardiovascular: Normal rate and regular rhythm.  Pulmonary/Chest: Effort normal and breath sounds normal. He has no wheezes. He has no rales.   Active cough with hemoptysis  Abdominal: Soft. Bowel sounds are normal. There is no tenderness. There is no rebound and no guarding.  Musculoskeletal: Normal range of motion.  Neurological: He is alert and oriented to person, place, and time. No sensory deficit. He exhibits normal muscle tone. Coordination normal.  CN's 3-12 grossly intact. Speech is clear and focused. No facial asymmetry. No lateralizing weakness. No deficits of coordination.     Skin: Skin is warm and dry. No rash noted.  Psychiatric: He has a normal mood and affect.     ED Treatments / Results  Labs (all labs ordered are listed, but only abnormal results are displayed) Labs Reviewed  CBC WITH DIFFERENTIAL/PLATELET  COMPREHENSIVE METABOLIC PANEL  RAPID URINE DRUG SCREEN, HOSP PERFORMED  PROTIME-INR  ETHANOL  TYPE AND SCREEN    EKG  EKG Interpretation None       Radiology No results found.  Procedures Procedures (including critical care time) CRITICAL CARE Performed by: Arnoldo Hooker   Total critical care time: 30 minutes  Critical care time was exclusive of separately billable procedures and treating other patients.  Critical care was necessary to treat or prevent imminent or life-threatening deterioration.  Critical care was time spent personally by me on the following activities: development of treatment plan with patient and/or surrogate as well as nursing, discussions with consultants, evaluation of patient's response to treatment, examination of patient, obtaining history from patient or surrogate, ordering and performing treatments and interventions, ordering and review of laboratory studies, ordering and review of radiographic studies, pulse oximetry and re-evaluation of patient's condition.  Medications Ordered in ED Medications - No data to display   Initial Impression / Assessment and Plan / ED Course  I have reviewed the triage vital signs and the nursing notes.  Pertinent labs &  imaging results that were available during my care of the patient were reviewed by me and considered in my medical decision making (see chart for details).     Patient presents after oxycodone overdose and seizure, responsive with Narcan by EMS. He is significantly hypoxic on arrival. NRB applied and his oxygenation recovers to upper 90's. He is actively coughing, producing bloody sputum.   He is neurologically intact without focal deficit. Appropriately responsive and oriented. His narcotic use was recreational, and overdose was not intentional.   CXR confirms pulmonary edema. O2 sats remaining stable on NRB. Coughing and hemoptysis have significantly improved. He is responding to O2 therapy well and does not appear to need ICU care.   Discussed with Dr. Antionette Char who accepts the patient onto his service.   Final Clinical Impressions(s) / ED Diagnoses   Final diagnoses:  None   1. Pulmonary edema 2. Narcotic overdose   ED Discharge Orders    None       Elpidio Anis, PA-C 03/24/17 0569    Gilda Crease, MD 03/24/17 [phone]

## 2017-03-24 ENCOUNTER — Other Ambulatory Visit: Payer: Self-pay

## 2017-03-24 ENCOUNTER — Observation Stay (HOSPITAL_COMMUNITY): Payer: Managed Care, Other (non HMO)

## 2017-03-24 ENCOUNTER — Encounter (HOSPITAL_COMMUNITY): Payer: Self-pay | Admitting: Family Medicine

## 2017-03-24 DIAGNOSIS — R569 Unspecified convulsions: Secondary | ICD-10-CM

## 2017-03-24 DIAGNOSIS — J9601 Acute respiratory failure with hypoxia: Secondary | ICD-10-CM | POA: Diagnosis present

## 2017-03-24 DIAGNOSIS — F419 Anxiety disorder, unspecified: Secondary | ICD-10-CM | POA: Diagnosis present

## 2017-03-24 DIAGNOSIS — Z813 Family history of other psychoactive substance abuse and dependence: Secondary | ICD-10-CM | POA: Diagnosis not present

## 2017-03-24 DIAGNOSIS — T40601A Poisoning by unspecified narcotics, accidental (unintentional), initial encounter: Secondary | ICD-10-CM | POA: Diagnosis not present

## 2017-03-24 DIAGNOSIS — Z560 Unemployment, unspecified: Secondary | ICD-10-CM | POA: Diagnosis not present

## 2017-03-24 DIAGNOSIS — F1210 Cannabis abuse, uncomplicated: Secondary | ICD-10-CM | POA: Diagnosis not present

## 2017-03-24 DIAGNOSIS — Z818 Family history of other mental and behavioral disorders: Secondary | ICD-10-CM | POA: Diagnosis not present

## 2017-03-24 DIAGNOSIS — F329 Major depressive disorder, single episode, unspecified: Secondary | ICD-10-CM | POA: Diagnosis present

## 2017-03-24 DIAGNOSIS — Z79899 Other long term (current) drug therapy: Secondary | ICD-10-CM | POA: Diagnosis not present

## 2017-03-24 DIAGNOSIS — J810 Acute pulmonary edema: Secondary | ICD-10-CM

## 2017-03-24 DIAGNOSIS — Z23 Encounter for immunization: Secondary | ICD-10-CM | POA: Diagnosis not present

## 2017-03-24 DIAGNOSIS — K219 Gastro-esophageal reflux disease without esophagitis: Secondary | ICD-10-CM | POA: Diagnosis present

## 2017-03-24 DIAGNOSIS — F1910 Other psychoactive substance abuse, uncomplicated: Secondary | ICD-10-CM | POA: Diagnosis not present

## 2017-03-24 DIAGNOSIS — F1410 Cocaine abuse, uncomplicated: Secondary | ICD-10-CM | POA: Diagnosis not present

## 2017-03-24 DIAGNOSIS — Z811 Family history of alcohol abuse and dependence: Secondary | ICD-10-CM | POA: Diagnosis not present

## 2017-03-24 DIAGNOSIS — F17210 Nicotine dependence, cigarettes, uncomplicated: Secondary | ICD-10-CM | POA: Diagnosis present

## 2017-03-24 DIAGNOSIS — G40409 Other generalized epilepsy and epileptic syndromes, not intractable, without status epilepticus: Secondary | ICD-10-CM | POA: Diagnosis present

## 2017-03-24 DIAGNOSIS — N289 Disorder of kidney and ureter, unspecified: Secondary | ICD-10-CM | POA: Diagnosis present

## 2017-03-24 DIAGNOSIS — R0600 Dyspnea, unspecified: Secondary | ICD-10-CM | POA: Diagnosis not present

## 2017-03-24 DIAGNOSIS — T402X1A Poisoning by other opioids, accidental (unintentional), initial encounter: Secondary | ICD-10-CM | POA: Diagnosis present

## 2017-03-24 DIAGNOSIS — D72829 Elevated white blood cell count, unspecified: Secondary | ICD-10-CM | POA: Diagnosis present

## 2017-03-24 HISTORY — DX: Acute pulmonary edema: J81.0

## 2017-03-24 HISTORY — DX: Acute respiratory failure with hypoxia: J96.01

## 2017-03-24 HISTORY — DX: Unspecified convulsions: R56.9

## 2017-03-24 LAB — MRSA PCR SCREENING: MRSA BY PCR: NEGATIVE

## 2017-03-24 LAB — BASIC METABOLIC PANEL
Anion gap: 11 (ref 5–15)
BUN: 17 mg/dL (ref 6–20)
CALCIUM: 8.6 mg/dL — AB (ref 8.9–10.3)
CHLORIDE: 101 mmol/L (ref 101–111)
CO2: 25 mmol/L (ref 22–32)
CREATININE: 1.2 mg/dL (ref 0.61–1.24)
GFR calc Af Amer: >60 mL/min (ref >60)
GFR calc non Af Amer: >60 mL/min (ref >60)
Glucose, Bld: 113 mg/dL — ABNORMAL HIGH (ref 65–99)
Potassium: 4.7 mmol/L (ref 3.5–5.1)
SODIUM: 137 mmol/L (ref 135–145)

## 2017-03-24 LAB — CBC WITH DIFFERENTIAL/PLATELET
Basophils Absolute: 0 10*3/uL (ref 0.0–0.1)
Basophils Relative: 0 %
EOS ABS: 0.1 10*3/uL (ref 0.0–0.7)
EOS PCT: 0 %
HCT: 42.7 % (ref 39.0–52.0)
HEMOGLOBIN: 14.2 g/dL (ref 13.0–17.0)
LYMPHS ABS: 0.4 10*3/uL — AB (ref 0.7–4.0)
Lymphocytes Relative: 2 %
MCH: 31.8 pg (ref 26.0–34.0)
MCHC: 33.3 g/dL (ref 30.0–36.0)
MCV: 95.5 fL (ref 78.0–100.0)
MONO ABS: 0.8 10*3/uL (ref 0.1–1.0)
MONOS PCT: 4 %
NEUTROS PCT: 94 %
Neutro Abs: 20 10*3/uL — ABNORMAL HIGH (ref 1.7–7.7)
PLATELETS: 171 10*3/uL (ref 150–400)
RBC: 4.47 MIL/uL (ref 4.22–5.81)
RDW: 13.2 % (ref 11.5–15.5)
WBC: 21.4 10*3/uL — ABNORMAL HIGH (ref 4.0–10.5)

## 2017-03-24 LAB — HIV ANTIBODY (ROUTINE TESTING W REFLEX): HIV Screen 4th Generation wRfx: NONREACTIVE

## 2017-03-24 LAB — RAPID URINE DRUG SCREEN, HOSP PERFORMED
Amphetamines: NOT DETECTED
BARBITURATES: NOT DETECTED
BENZODIAZEPINES: NOT DETECTED
COCAINE: NOT DETECTED
OPIATES: NOT DETECTED
TETRAHYDROCANNABINOL: POSITIVE — AB

## 2017-03-24 LAB — ABO/RH: ABO/RH(D): A NEG

## 2017-03-24 LAB — TROPONIN I

## 2017-03-24 MED ORDER — ACETAMINOPHEN 650 MG RE SUPP: 650.0000 mg | Freq: Four times a day (QID) | RECTAL | Status: DC | PRN

## 2017-03-24 MED ORDER — LORAZEPAM 2 MG/ML IJ SOLN
0.5000 mg | INTRAMUSCULAR | Status: DC | PRN
  Administered 2017-03-24: 0.5 mg via INTRAVENOUS
  Administered 2017-03-24 (×3): 1 mg via INTRAVENOUS
  Administered 2017-03-25 – 2017-03-26 (×3): 0.5 mg via INTRAVENOUS
  Filled 2017-03-24 (×7): qty 1

## 2017-03-24 MED ORDER — BUSPIRONE HCL 5 MG PO TABS
5.0000 mg | ORAL_TABLET | Freq: Two times a day (BID) | ORAL | Status: DC
  Administered 2017-03-24 – 2017-03-25 (×4): 5 mg via ORAL
  Filled 2017-03-24 (×6): qty 1

## 2017-03-24 MED ORDER — SODIUM CHLORIDE 0.9% FLUSH: 3.0000 mL | INTRAVENOUS | Status: DC | PRN

## 2017-03-24 MED ORDER — KETOROLAC TROMETHAMINE 30 MG/ML IJ SOLN: 30.0000 mg | Freq: Four times a day (QID) | INTRAMUSCULAR | Status: DC | PRN

## 2017-03-24 MED ORDER — ONDANSETRON HCL 4 MG PO TABS: 4.0000 mg | ORAL_TABLET | Freq: Four times a day (QID) | ORAL | Status: DC | PRN

## 2017-03-24 MED ORDER — SODIUM CHLORIDE 0.9% FLUSH: 3.0000 mL | Freq: Two times a day (BID) | INTRAVENOUS | Status: DC

## 2017-03-24 MED ORDER — PANTOPRAZOLE SODIUM 40 MG PO TBEC
40.0000 mg | DELAYED_RELEASE_TABLET | Freq: Every day | ORAL | Status: DC
  Administered 2017-03-24 – 2017-03-26 (×3): 40 mg via ORAL
  Filled 2017-03-24 (×3): qty 1

## 2017-03-24 MED ORDER — ONDANSETRON HCL 4 MG/2ML IJ SOLN
4.0000 mg | Freq: Four times a day (QID) | INTRAMUSCULAR | Status: DC | PRN
  Administered 2017-03-24: 4 mg via INTRAVENOUS
  Filled 2017-03-24: qty 2

## 2017-03-24 MED ORDER — SODIUM CHLORIDE 0.9% FLUSH
3.0000 mL | Freq: Two times a day (BID) | INTRAVENOUS | Status: DC
  Administered 2017-03-24 – 2017-03-26 (×5): 3 mL via INTRAVENOUS

## 2017-03-24 MED ORDER — SODIUM CHLORIDE 0.9 % IV SOLN: 250.0000 mL | INTRAVENOUS | Status: DC | PRN

## 2017-03-24 MED ORDER — ESCITALOPRAM OXALATE 20 MG PO TABS
20.0000 mg | ORAL_TABLET | Freq: Every day | ORAL | Status: DC
  Administered 2017-03-24 – 2017-03-26 (×3): 20 mg via ORAL
  Filled 2017-03-24 (×2): qty 1
  Filled 2017-03-24: qty 2

## 2017-03-24 MED ORDER — TRAMADOL HCL 50 MG PO TABS
100.0000 mg | ORAL_TABLET | Freq: Four times a day (QID) | ORAL | Status: DC | PRN
  Administered 2017-03-24: 100 mg via ORAL
  Filled 2017-03-24 (×2): qty 2

## 2017-03-24 MED ORDER — ACETAMINOPHEN 325 MG PO TABS
650.0000 mg | ORAL_TABLET | Freq: Four times a day (QID) | ORAL | Status: DC | PRN
  Administered 2017-03-24 – 2017-03-25 (×3): 650 mg via ORAL
  Filled 2017-03-24 (×4): qty 2

## 2017-03-24 MED ORDER — INFLUENZA VAC SPLIT QUAD 0.5 ML IM SUSY
0.5000 mL | PREFILLED_SYRINGE | INTRAMUSCULAR | Status: AC
  Administered 2017-03-25: 0.5 mL via INTRAMUSCULAR
  Filled 2017-03-24: qty 0.5

## 2017-03-24 NOTE — Progress Notes (Signed)
RT instructed pt on the use of incentive spirometer.  Pt able to reach 2000 mL using it with good technique. Family at bedside.

## 2017-03-24 NOTE — ED Notes (Signed)
Patient transported to CT scan . 

## 2017-03-24 NOTE — H&P (Signed)
History and Physical    Corey Rivera WUJ:[id number] DOB: 1995-02-27 DOA: 03/23/2017  PCP: Waldon Merl, PA-C   Patient coming from: Home  Chief Complaint: Generalized seizure   HPI: Corey Rivera is a 22 y.o. male with medical history significant for polysubstance abuse, depression, and anxiety, now presenting to the emergency department after a generalized seizure at home.  The patient had reportedly been insufflating Roxicodone with his brother, became unresponsive, and was then noted to have a generalized seizure.  EMS was called, found the patient to be unresponsive, administered Narcan with good results.  Patient was brought into the ED for further evaluation.  He reports snorting Roxicodone prior to going to sleep last night, waking with a tongue injury and general malaise, then going on to have a unremarkable day prior to the overdose and seizure.  He denies trying to harm himself.  Denies hallucinations.  ED Course: Upon arrival to the ED, patient is found to be saturating low 70s on room air, cardiac, and with stable blood pressure.  EKG features a sinus tachycardia with rate 121 and chest x-ray is notable for bilateral pulmonary edema without cardiomegaly.  Chemistry panel features a creatinine of 1.38, slightly up from his recent priors.  CBC is notable for leukocytosis to 20,500.  UDS is positive for THC.  Patient was given supplemental oxygen via nonrebreather.  He has remained hemodynamically stable in the ED and will be observed in the stepdown unit for ongoing evaluation and management of opiate overdose with generalized seizure and acute pulmonary edema with hypoxia, likely neurogenic.  Review of Systems:  All other systems reviewed and apart from HPI, are negative.  Past Medical History:  Diagnosis Date  . Allergy   . Depression   . Drug addiction (HCC)   . GERD (gastroesophageal reflux disease)   . History of chickenpox     Past Surgical History:  Procedure  Laterality Date  . NO PAST SURGERIES       reports that he has been smoking cigarettes.  He has been smoking about 0.00 packs per day. he has never used smokeless tobacco. He reports that he drinks alcohol. He reports that he uses drugs. Drugs: Cocaine and Marijuana.  No Known Allergies  Family History  Problem Relation Age of Onset  . Alcohol abuse Father   . Alcohol abuse Brother   . Asthma Brother   . Diabetes Brother   . Arthritis Maternal Grandmother   . Asthma Maternal Grandmother   . COPD Maternal Grandmother   . Alcohol abuse Maternal Grandfather   . Alcohol abuse Paternal Grandfather   . Heart attack Paternal Grandfather   . Hyperlipidemia Paternal Grandfather      Prior to Admission medications   Medication Sig Start Date End Date Taking? Authorizing Provider  busPIRone (BUSPAR) 5 MG tablet Take 1 tablet (5 mg total) by mouth 2 (two) times daily. 03/17/17  Yes Waldon Merl, PA-C  escitalopram (LEXAPRO) 20 MG tablet Take 1 tablet (20 mg total) by mouth daily. 02/25/17  Yes Waldon Merl, PA-C  pantoprazole (PROTONIX) 40 MG tablet Take 1 tablet (40 mg total) by mouth daily. 01/23/17  Yes Waldon Merl, PA-C    Physical Exam: Vitals:   03/23/17 2300 03/23/17 2330 03/24/17 0000 03/24/17 0030  BP: (!) 141/63 (!) 128/56 (!) 123/55 121/68  Pulse: (!) 111 (!) 102 93 90  Resp: (!) 21 18 20  (!) 21  SpO2: 100% 100% 100% 100%  Constitutional: NAD, calm  Eyes: PERTLA, lids and conjunctivae normal ENMT: Mucous membranes are moist. Posterior pharynx clear of any exudate or lesions.   Neck: normal, supple, no masses, no thyromegaly Respiratory: Rales bilaterally. No pallor or cyanosis. No accessory muscle use.  Cardiovascular: Rate ~120 and regular. No extremity edema.  Abdomen: No distension, no tenderness, no masses palpated. Bowel sounds normal.  Musculoskeletal: no clubbing / cyanosis. No joint deformity upper and lower extremities.   Skin: no significant  rashes, lesions, ulcers. Warm, dry, well-perfused. Neurologic: CN 2-12 grossly intact. Sensation intact. Strength 5/5 in all 4 limbs.  Psychiatric: Somnolent, easily roused and oriented x 3. Calm, cooperative.     Labs on Admission: I have personally reviewed following labs and imaging studies  CBC: Recent Labs  Lab 03/23/17 2239  WBC 20.5*  NEUTROABS 17.9*  HGB 14.6  HCT 44.1  MCV 95.9  PLT 210   Basic Metabolic Panel: Recent Labs  Lab 03/23/17 2239  NA 135  K 4.5  CL 97*  CO2 27  GLUCOSE 151*  BUN 17  CREATININE 1.38*  CALCIUM 8.7*   GFR: Estimated Creatinine Clearance: 91.7 mL/min (A) (by C-G formula based on SCr of 1.38 mg/dL (H)). Liver Function Tests: Recent Labs  Lab 03/23/17 2239  AST 59*  ALT 34  ALKPHOS 73  BILITOT 0.4  PROT 6.3*  ALBUMIN 3.9   No results for input(s): LIPASE, AMYLASE in the last 168 hours. No results for input(s): AMMONIA in the last 168 hours. Coagulation Profile: Recent Labs  Lab 03/23/17 2239  INR 1.00   Cardiac Enzymes: No results for input(s): CKTOTAL, CKMB, CKMBINDEX, TROPONINI in the last 168 hours. BNP (last 3 results) No results for input(s): PROBNP in the last 8760 hours. HbA1C: No results for input(s): HGBA1C in the last 72 hours. CBG: No results for input(s): GLUCAP in the last 168 hours. Lipid Profile: No results for input(s): CHOL, HDL, LDLCALC, TRIG, CHOLHDL, LDLDIRECT in the last 72 hours. Thyroid Function Tests: No results for input(s): TSH, T4TOTAL, FREET4, T3FREE, THYROIDAB in the last 72 hours. Anemia Panel: No results for input(s): VITAMINB12, FOLATE, FERRITIN, TIBC, IRON, RETICCTPCT in the last 72 hours. Urine analysis:    Component Value Date/Time   COLORURINE YELLOW 03/10/2017 1551   APPEARANCEUR CLEAR 03/10/2017 1551   LABSPEC 1.020 03/10/2017 1551   PHURINE 7.0 03/10/2017 1551   GLUCOSEU NEGATIVE 03/10/2017 1551   HGBUR NEGATIVE 03/10/2017 1551   BILIRUBINUR NEGATIVE 03/10/2017 1551    KETONESUR NEGATIVE 03/10/2017 1551   PROTEINUR NEGATIVE 03/10/2017 1551   NITRITE NEGATIVE 03/10/2017 1551   LEUKOCYTESUR NEGATIVE 03/10/2017 1551   Sepsis Labs: @LABRCNTIP (procalcitonin:4,lacticidven:4) )No results found for this or any previous visit (from the past 240 hour(s)).   Radiological Exams on Admission: Dg Chest Portable 1 View  Result Date: 03/23/2017 CLINICAL DATA:  grand mal seizure episode this evening after snorting Roxicodone , received Narcan 0.5 mg by EMS prior to arrival , alert and oriented at arrival , emesis and mild oral bleeding at arrival. EXAM: PORTABLE CHEST 1 VIEW COMPARISON:  Chest x-ray dated 03/10/2017. FINDINGS: Bilateral perihilar and lower lobe pulmonary edema. No pleural effusion or pneumothorax seen. Heart size is within normal limits. Osseous structures about the chest are unremarkable. IMPRESSION: Bilateral pulmonary edema. Electronically Signed   By: Bary Richard M.D.   On: 03/23/2017 22:52    EKG: Independently reviewed. Sinus tachycardia (rate 121).   Assessment/Plan  1. Acute pulmonary edema; acute hypoxic respiratory failure  - Presents  following a generalized seizure at home in setting of opiate overdose, resuscitated with Narcan by EMS on the scene, found to be hypoxic with bilateral pulmonary edema on CXR  - Likely neurogenic in setting of generalized seizure  - SLIV, continue supplemental O2, anticipate resolution without diuretics  2. Generalized seizure  - Presents following a generalized seizure at home  - He has hx of generalized seizure in setting of benzodiazepine withdrawal  - Likely secondary to brain injury from opiate overdose, resuscitated with Narcan by EMS on the scene - Completely oriented in ED with no focal neurologic deficit identified  - Check head CT, maintain seizure precautions, use Ativan prn    3. Suspected narcotic overdose; polysubstance abuse  - Presents following a generalized seizure at home that was  preceded by insufflating Roxicodone and then becoming unresponsive  - Counseled toward cessation, patient not very receptive, will request SW consultation    4. Mild renal insufficiency  - SCr is 1.38 on admission, slightly up from priors  - Holding IVF in light of #1  - Renally-dose medications, avoid nephrotoxins, repeat chem panel in am     DVT prophylaxis: SCD's  Code Status: Full  Family Communication: Aunt updated at bedside with patient's permission  Disposition Plan: Observe in SDU Consults called: None Admission status: Observation    Briscoe Deutscher, MD [facility] Pager [phone]  If 7PM-7AM, please contact night-coverage [email or web address] Password [email or web address]  03/24/2017, 1:03 AM

## 2017-03-24 NOTE — Care Management Note (Signed)
Case Management Note  Patient Details  Name: Harnoor Brosch MRN: [id number] Date of Birth: October 25, 1995  Subjective/Objective:   From home alone, pta indep,presents with  Acute pulmonary edema, acute hypoxic resp failure, seizure, suspected narcotic OD, psa, and mild renal insufficiency.                   Action/Plan: NCM will follow for dc needs.   Expected Discharge Date:                  Expected Discharge Plan:  Home/Self Care  In-House Referral:  Clinical Social Work  Discharge planning Services  CM Consult  Post Acute Care Choice:    Choice offered to:     DME Arranged:    DME Agency:     HH Arranged:    HH Agency:     Status of Service:  In process, will continue to follow  If discussed at Long Length of Stay Meetings, dates discussed:    Additional Comments:  Leone Haven, RN 03/24/2017, 3:54 PM

## 2017-03-24 NOTE — Progress Notes (Signed)
Corey Rivera is a 22 y.o. male with medical history significant for polysubstance abuse, depression, and anxiety, now presenting to the emergency department after a generalized seizure at home.    03/24/17: seen and examined the patient with his grandmother and cousin at his bedside in the ED. No new complaints. Please refer to Dr Antionette Char H&P for detailed assessment and plan.   Patient requests to speak with the chaplain and a psychiatrist. Denies SI and HI but states that he is depressed.

## 2017-03-24 NOTE — ED Notes (Signed)
I trialed the patient on a nasal cannula at 6L, he was unable to maintain O2 sats of > 90%. Placed on a venturi mask.

## 2017-03-24 NOTE — Clinical Social Work Note (Signed)
Clinical Social Work Assessment  Patient Details  Name: Corey Rivera MRN: [id number] Date of Birth: 19-May-1995  Date of referral:  03/24/17               Reason for consult:  Substance Use/ETOH Abuse                Permission sought to share information with:    Permission granted to share information::     Name::        Agency::     Relationship::     Contact Information:     Housing/Transportation Living arrangements for the past 2 months:  Apartment(pt was living in an apartment  alone. ) Source of Information:  Patient Patient Interpreter Needed:  None Criminal Activity/Legal Involvement Pertinent to Current Situation/Hospitalization:  No - Comment as needed(not at this time. ) Significant Relationships:  Other Family Members, Parents, Siblings Lives with:  Self, Relatives Do you feel safe going back to the place where you live?  Yes Need for family participation in patient care:  Yes (Comment)  Care giving concerns:  CSW spoke with pt at bedside. Pt is concerned with what is next for pt. Pt reports that pt just lost job and has been having a tough time the past year.    Social Worker assessment / plan: CSW spoke with pt and family at bedside. Pt reports that pt has support from family including grandmother, father and other relatives. Per pt pt is a current drug user and reports that pt recently had detox from [facility] ED last week. CSW was informed that pt has been having a tough time with loosing girlfriend, being diagnosed with and STD , as well as loosing job. CSW offered resources to pt and pt is agreeable to plan of care at this time.  Employment status:  Unemployed Insurance information:  Other (Comment Required)([employer] ) PT Recommendations:  Not assessed at this time Information / Referral to community resources:  Outpatient Substance Abuse Treatment Options, Residential Substance Abuse Treatment Options  Patient/Family's Response to care:  Pt appears to be understanding and  agreeable to plan of care at this time.   Patient/Family's Understanding of and Emotional Response to Diagnosis, Current Treatment, and Prognosis:  No further questions or concerns have been presented to CSW at this time.   Emotional Assessment Appearance:  Appears stated age Attitude/Demeanor/Rapport:  Gracious Affect (typically observed):  Accepting, Pleasant Orientation:  Oriented to Self, Oriented to Place, Oriented to  Time, Oriented to Situation Alcohol / Substance use:  Alcohol Use, Illicit Drugs, Tobacco Use Psych involvement (Current and /or in the community):  No (Comment)(not at this time. CSW has given pt resources at this time. )  Discharge Needs  Concerns to be addressed:  Basic Needs Readmission within the last 30 days:    Current discharge risk:  Substance Abuse Barriers to Discharge:  Continued Medical Work up   [employer], LCSWA 03/24/2017, 11:49 AM

## 2017-03-25 ENCOUNTER — Inpatient Hospital Stay (HOSPITAL_COMMUNITY): Payer: Managed Care, Other (non HMO)

## 2017-03-25 DIAGNOSIS — Z818 Family history of other mental and behavioral disorders: Secondary | ICD-10-CM

## 2017-03-25 DIAGNOSIS — T40601A Poisoning by unspecified narcotics, accidental (unintentional), initial encounter: Secondary | ICD-10-CM

## 2017-03-25 DIAGNOSIS — F1910 Other psychoactive substance abuse, uncomplicated: Secondary | ICD-10-CM

## 2017-03-25 DIAGNOSIS — G4700 Insomnia, unspecified: Secondary | ICD-10-CM

## 2017-03-25 DIAGNOSIS — F1210 Cannabis abuse, uncomplicated: Secondary | ICD-10-CM

## 2017-03-25 DIAGNOSIS — Z560 Unemployment, unspecified: Secondary | ICD-10-CM

## 2017-03-25 DIAGNOSIS — F419 Anxiety disorder, unspecified: Secondary | ICD-10-CM

## 2017-03-25 DIAGNOSIS — F1410 Cocaine abuse, uncomplicated: Secondary | ICD-10-CM

## 2017-03-25 DIAGNOSIS — J810 Acute pulmonary edema: Secondary | ICD-10-CM

## 2017-03-25 DIAGNOSIS — Z813 Family history of other psychoactive substance abuse and dependence: Secondary | ICD-10-CM

## 2017-03-25 DIAGNOSIS — D72829 Elevated white blood cell count, unspecified: Secondary | ICD-10-CM

## 2017-03-25 DIAGNOSIS — R0600 Dyspnea, unspecified: Secondary | ICD-10-CM

## 2017-03-25 DIAGNOSIS — Z811 Family history of alcohol abuse and dependence: Secondary | ICD-10-CM

## 2017-03-25 DIAGNOSIS — R450 Nervousness: Secondary | ICD-10-CM

## 2017-03-25 DIAGNOSIS — F329 Major depressive disorder, single episode, unspecified: Secondary | ICD-10-CM

## 2017-03-25 DIAGNOSIS — R569 Unspecified convulsions: Secondary | ICD-10-CM

## 2017-03-25 DIAGNOSIS — Z590 Homelessness: Secondary | ICD-10-CM

## 2017-03-25 DIAGNOSIS — J9601 Acute respiratory failure with hypoxia: Secondary | ICD-10-CM

## 2017-03-25 DIAGNOSIS — F17210 Nicotine dependence, cigarettes, uncomplicated: Secondary | ICD-10-CM

## 2017-03-25 LAB — BASIC METABOLIC PANEL
ANION GAP: 11 (ref 5–15)
BUN: 8 mg/dL (ref 6–20)
CHLORIDE: 98 mmol/L — AB (ref 101–111)
CO2: 26 mmol/L (ref 22–32)
Calcium: 9.3 mg/dL (ref 8.9–10.3)
Creatinine, Ser: 1.09 mg/dL (ref 0.61–1.24)
GFR calc Af Amer: >60 mL/min (ref >60)
GFR calc non Af Amer: >60 mL/min (ref >60)
GLUCOSE: 100 mg/dL — AB (ref 65–99)
POTASSIUM: 4.5 mmol/L (ref 3.5–5.1)
Sodium: 135 mmol/L (ref 135–145)

## 2017-03-25 LAB — ECHOCARDIOGRAM COMPLETE
EWDT: 268 ms
FS: 26 % — AB (ref 28–44)
Height: 70 in
IVS/LV PW RATIO, ED: 0.79
LA diam end sys: 33 mm
LA diam index: 1.67 cm/m2
LA vol index: 24 mL/m2
LASIZE: 33 mm
LAVOL: 47.3 mL
LAVOLA4C: 44.3 mL
LDCA: 4.15 cm2
LVOTD: 23 mm
MV Dec: 268
MV pk A vel: 62.1 m/s
MVPKEVEL: 50.1 m/s
PV Reg vel dias: 97 cm/s
PW: 11.1 mm — AB (ref 0.6–1.1)
TAPSE: 23.9 mm
Weight: 2758.4 oz

## 2017-03-25 LAB — CBC
HEMATOCRIT: 41.6 % (ref 39.0–52.0)
HEMOGLOBIN: 13.8 g/dL (ref 13.0–17.0)
MCH: 31.7 pg (ref 26.0–34.0)
MCHC: 33.2 g/dL (ref 30.0–36.0)
MCV: 95.6 fL (ref 78.0–100.0)
Platelets: 158 10*3/uL (ref 150–400)
RBC: 4.35 MIL/uL (ref 4.22–5.81)
RDW: 13.2 % (ref 11.5–15.5)
WBC: 15.3 10*3/uL — AB (ref 4.0–10.5)

## 2017-03-25 MED ORDER — GUAIFENESIN-DM 100-10 MG/5ML PO SYRP
5.0000 mL | ORAL_SOLUTION | ORAL | Status: DC | PRN
  Administered 2017-03-25: 5 mL via ORAL
  Filled 2017-03-25: qty 5

## 2017-03-25 MED ORDER — FUROSEMIDE 10 MG/ML IJ SOLN
60.0000 mg | Freq: Once | INTRAMUSCULAR | Status: AC
  Administered 2017-03-25: 60 mg via INTRAVENOUS
  Filled 2017-03-25: qty 6

## 2017-03-25 MED ORDER — ZOLPIDEM TARTRATE 5 MG PO TABS
5.0000 mg | ORAL_TABLET | Freq: Once | ORAL | Status: AC
  Administered 2017-03-25: 5 mg via ORAL
  Filled 2017-03-25: qty 1

## 2017-03-25 NOTE — Progress Notes (Signed)
Patient ID: Corey Rivera, male   DOB: [birth year], 22 y.o.   MRN: [id number]  PROGRESS NOTE    Corey Rivera  WUJ:[id number] DOB: 06/27/1995 DOA: 03/23/2017 PCP: Corey Rivera   Brief Narrative:  22 year old male with history of polysubstance abuse, depression and anxiety presented with generalized seizure at home.  Patient had snorted oxycodone at home, had become unresponsive and had generalized seizure.  EMS administered Narcan and patient became more awake.  He was also found to be in acute pulmonary edema.  Assessment & Plan:   Principal Problem:   Acute pulmonary edema (HCC) Active Problems:   Anxiety and depression   Polysubstance abuse (HCC)   Seizure (HCC)   Acute respiratory failure with hypoxia (HCC)    1. Acute pulmonary edema with acute hypoxic respiratory failure  - Likely neurogenic in setting of generalized seizure  -Respiratory status improving.  Patient is diuresing well without diuretics.  Repeat chest x-ray today. -2D echo.  2. Generalized seizure  - Presents following a generalized seizure at home  - He has hx of generalized seizure in setting of benzodiazepine withdrawal  - Probably secondary to opiate overdose.  No further seizures since admission.  Currently mental status has much improved.  CT head was negative for acute intracranial events -Monitor mental status.  EEG.  -Outpatient follow-up with primary care provider and/or neurology.    3. Suspected narcotic overdose in a patient with history of polysubstance abuse  and depression/anxiety - Counseled toward cessation - SW consultation  -Psychiatry consultation  4. Mild renal insufficiency  -Resolved.  5.  Leukocytosis -Probably reactive.  Improving.  Repeat a.m. labs   DVT prophylaxis: SCDs Code Status: Full Family Communication: Spoke to grandmother and aunt at bedside Disposition Plan: Home probably in the next 1-2 days  Consultants: None  Procedures: None  Antimicrobials:  None   Subjective: Patient seen and examined at bedside.  He is awake and answering questions.  His breathing is improving.  Denies overnight fever, nausea or vomiting.  Objective: Vitals:   03/25/17 0300 03/25/17 0400 03/25/17 0500 03/25/17 0747  BP: (!) 115/59 114/72 111/74 (!) 133/98  Pulse: 92 91 92 91  Resp: 19 (!) 23 20 15   Temp: 98.2 F (36.8 C)   98.7 F (37.1 C)  TempSrc: Oral   Oral  SpO2: 98% 94% 96% 95%  Weight:   78.2 kg (172 lb 6.4 oz)   Height:        Intake/Output Summary (Last 24 hours) at 03/25/2017 1010 Last data filed at 03/25/2017 0035 Gross per 24 hour  Intake 710 ml  Output 1500 ml  Net -790 ml   Filed Weights   03/24/17 1415 03/25/17 0500  Weight: 79.8 kg (175 lb 14.8 oz) 78.2 kg (172 lb 6.4 oz)    Examination:  General exam: Appears calm and comfortable.  Alert and awake and answering questions Respiratory system: Bilateral decreased breath sound at bases with scattered crackles Cardiovascular system: S1 & S2 heard, controlled  gastrointestinal system: Abdomen is nondistended, soft and nontender. Normal bowel sounds heard. Extremities: No cyanosis, clubbing, edema   Data Reviewed: I have personally reviewed following labs and imaging studies  CBC: Recent Labs  Lab 03/23/17 2239 03/24/17 0332 03/25/17 0401  WBC 20.5* 21.4* 15.3*  NEUTROABS 17.9* 20.0*  --   HGB 14.6 14.2 13.8  HCT 44.1 42.7 41.6  MCV 95.9 95.5 95.6  PLT 210 171 158   Basic Metabolic Panel: Recent Labs  Lab 03/23/17 2239  03/24/17 0332 03/25/17 0401  NA 135 137 135  K 4.5 4.7 4.5  CL 97* 101 98*  CO2 27 25 26   GLUCOSE 151* 113* 100*  BUN 17 17 8   CREATININE 1.38* 1.20 1.09  CALCIUM 8.7* 8.6* 9.3   GFR: Estimated Creatinine Clearance: 112.6 mL/min (by C-G formula based on SCr of 1.09 mg/dL). Liver Function Tests: Recent Labs  Lab 03/23/17 2239  AST 59*  ALT 34  ALKPHOS 73  BILITOT 0.4  PROT 6.3*  ALBUMIN 3.9   No results for input(s): LIPASE,  AMYLASE in the last 168 hours. No results for input(s): AMMONIA in the last 168 hours. Coagulation Profile: Recent Labs  Lab 03/23/17 2239  INR 1.00   Cardiac Enzymes: Recent Labs  Lab 03/24/17 0332  TROPONINI <0.03   BNP (last 3 results) No results for input(s): PROBNP in the last 8760 hours. HbA1C: No results for input(s): HGBA1C in the last 72 hours. CBG: No results for input(s): GLUCAP in the last 168 hours. Lipid Profile: No results for input(s): CHOL, HDL, LDLCALC, TRIG, CHOLHDL, LDLDIRECT in the last 72 hours. Thyroid Function Tests: No results for input(s): TSH, T4TOTAL, FREET4, T3FREE, THYROIDAB in the last 72 hours. Anemia Panel: No results for input(s): VITAMINB12, FOLATE, FERRITIN, TIBC, IRON, RETICCTPCT in the last 72 hours. Sepsis Labs: No results for input(s): PROCALCITON, LATICACIDVEN in the last 168 hours.  Recent Results (from the past 240 hour(s))  MRSA PCR Screening     Status: None   Collection Time: 03/24/17  2:15 PM  Result Value Ref Range Status   MRSA by PCR NEGATIVE NEGATIVE Final    Comment:        The GeneXpert MRSA Assay (FDA approved for NASAL specimens only), is one component of a comprehensive MRSA colonization surveillance program. It is not intended to diagnose MRSA infection nor to guide or monitor treatment for MRSA infections.          Radiology Studies: Ct Head Wo Contrast  Result Date: 03/24/2017 CLINICAL DATA:  Greater than mall seizure after snorting Roxicodone. Vomiting. EXAM: CT HEAD WITHOUT CONTRAST TECHNIQUE: Contiguous axial images were obtained from the base of the skull through the vertex without intravenous contrast. COMPARISON:  CT HEAD March 10, 2017 FINDINGS: BRAIN: No intraparenchymal hemorrhage, mass effect nor midline shift. The ventricles and sulci are normal. No acute large vascular territory infarcts. No abnormal extra-axial fluid collections. 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 Basal cisterns are patent. VASCULAR: Unremarkable. SKULL/SOFT TISSUES: No skull fracture. No significant soft tissue swelling. ORBITS/SINUSES: The included ocular globes and orbital contents are normal.Mild paranasal sinus mucosal thickening. Small RIGHT mastoid effusion without air cell coalescence. OTHER: None. IMPRESSION: Stable negative noncontrast CT HEAD. Electronically Signed   By: Awilda Metro M.D.   On: 03/24/2017 01:36   Dg Chest Portable 1 View  Result Date: 03/23/2017 CLINICAL DATA:  grand mal seizure episode this evening after snorting Roxicodone , received Narcan 0.5 mg by EMS prior to arrival , alert and oriented at arrival , emesis and mild oral bleeding at arrival. EXAM: PORTABLE CHEST 1 VIEW COMPARISON:  Chest x-ray dated 03/10/2017. FINDINGS: Bilateral perihilar and lower lobe pulmonary edema. No pleural effusion or pneumothorax seen. Heart size is within normal limits. Osseous structures about the chest are unremarkable. IMPRESSION: Bilateral pulmonary edema. Electronically Signed   By: Bary Richard M.D.   On: 03/23/2017 22:52        Scheduled Meds: . busPIRone  5 mg Oral BID  . escitalopram  20 mg Oral Daily  . Influenza vac split quadrivalent PF  0.5 mL Intramuscular Tomorrow-1000  . pantoprazole  40 mg Oral Daily  . sodium chloride flush  3 mL Intravenous Q12H  . sodium chloride flush  3 mL Intravenous Q12H   Continuous Infusions: . sodium chloride       LOS: 1 day        Glade Lloyd, MD [facility] Pager [phone]  If 7PM-7AM, please contact night-coverage [email or web address] Password [email or web address] 03/25/2017, 10:10 AM

## 2017-03-25 NOTE — Progress Notes (Signed)
03/25/2017 1800 Received pt to room 4E-23 from 28M.  Pt is A&O, no C/O voiced.  Tele monitor applied and CCMD notified.  Oriented to room, call light and bed.  Call bell in reach and family at bedside. Kathryne Hitch

## 2017-03-25 NOTE — Consult Note (Addendum)
[facility] Consult   Reason for Consult:  Severe depression Referring Physician:  Dr. Starla Link  Patient Identification: Corey Rivera MRN:  [id number] Principal Diagnosis: Polysubstance abuse [facility]) Diagnosis:   Patient Active Problem List   Diagnosis Date Noted  . Acute pulmonary edema ([place]) [J81.0] 03/24/2017  . Seizure ([place]) [R56.9] 03/24/2017  . Acute respiratory failure with hypoxia ([place]) [J96.01] 03/24/2017  . Polysubstance abuse ([place]) [F19.10]   . Anxiety and depression [F41.9, F32.9] 01/24/2017  . Gastroesophageal reflux disease without esophagitis [K21.9] 01/24/2017    Total Time spent with patient: 1 hour  Subjective:   Corey Rivera is a 22 y.o. male patient admitted with acute pulmonary edema with acute hypoxic respiratory failure.  HPI:   Per chart review, patient has a history of polysubstance abuse, depression and anxiety. He was admitted for acute pulmonary edema. He snorted Oxycodone at home, became unresponsive and was noted to have a generalized seizure. He required Narcan administration by EMS. 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   Of note, patient was seen at [facility] on 1/16 for SI. He reported taking too many Lexapro and Xanax in the setting of alcohol intoxication. He denied SI and was safety planned for discharge. He also spoke to peer support about outpatient substance abuse resources. UDS was positive for cocaine, marijuana and benzodiazepines at this admission. BAL was negative. Most recent UDS was positive for THC. He is prescribed Buspar 5 mg BID and Lexapro 20 mg daily.   On interview, he reports that his mood has been fluctuating since losing his job last week. He reports anxiety and worries about being unemployed. He had a panic attack 2 days ago. He reports poor sleep and appetite for the past week. He denies SI, HI or AVH. He has been using Roxicodone from off the street. He took 2 the night prior  to admission. He remembers feeling "weird" for most of the following day. He last remembers standing up and falling down. He reports having a seizure at home and believes that he was unresponsive for 25 minutes the following day. He has been considering substance abuse treatment since he is aware that his substance use has been problematic. He reports compliance with Lexapro.   Past Psychiatric History: Polysubstance abuse, depression and anxiety.   Risk to Self: Is patient at risk for suicide?: No Risk to Others:  None. Denies HI.  Prior Inpatient Therapy:  Denies Prior Outpatient Therapy:  His medications are prescribed by [facility].  Past Medical History:  Past Medical History:  Diagnosis Date  . Allergy   . Depression   . Drug addiction ([place])   . GERD (gastroesophageal reflux disease)   . History of chickenpox     Past Surgical History:  Procedure Laterality Date  . NO PAST SURGERIES     Family History:  Family History  Problem Relation Age of Onset  . Alcohol abuse Father   . Alcohol abuse Brother   . Asthma Brother   . Diabetes Brother   . Arthritis Maternal Grandmother   . Asthma Maternal Grandmother   . COPD Maternal Grandmother   . Alcohol abuse Maternal Grandfather   . Alcohol abuse Paternal Grandfather   . Heart attack Paternal Grandfather   . Hyperlipidemia Paternal Grandfather    Family Psychiatric  History: Father-alcoholism, maternal grandfather-committed suicide and alcoholism, paternal grandfather-alcoholism and brother-substance abuse.  Social History:  Social History   Substance and Sexual Activity  Alcohol Use  Yes     Social History   Substance and Sexual Activity  Drug Use Yes  . Types: Cocaine, Marijuana   Comment: Roxicodone    Social History   Socioeconomic History  . Marital status: Married    Spouse name: None  . Number of children: None  . Years of education: None  . Highest education level: None  Social Needs  . Financial resource  strain: None  . Food insecurity - worry: None  . Food insecurity - inability: None  . Transportation needs - medical: None  . Transportation needs - non-medical: None  Occupational History  . None  Tobacco Use  . Smoking status: Smoker, Current Status Unknown    Packs/day: 0.00    Types: Cigarettes  . Smokeless tobacco: Never Used  Substance and Sexual Activity  . Alcohol use: Yes  . Drug use: Yes    Types: Cocaine, Marijuana    Comment: Roxicodone  . Sexual activity: Yes  Other Topics Concern  . None  Social History Narrative  . None   Additional Social History: He recently lost his apartment. He plans to live with his family. 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 He reports using ecstasy, marijuana, pain pills and cocaine. He reports that pain pills are his drug of choice. He drinks a 12 pack of beer daily. His longest period of sobriety was 2 weeks. He has a history of seizures associated with substance withdrawal.     Allergies:  No Known Allergies  Labs:  Results for orders placed or performed during the hospital encounter of 03/23/17 (from the past 48 hour(s))  Ethanol     Status: None   Collection Time: 03/23/17 10:27 PM  Result Value Ref Range   Alcohol, Ethyl (B) <10 <10 mg/dL    Comment:        LOWEST DETECTABLE LIMIT FOR SERUM ALCOHOL IS 10 mg/dL FOR MEDICAL PURPOSES ONLY   Acetaminophen level     Status: Abnormal   Collection Time: 03/23/17 10:27 PM  Result Value Ref Range   Acetaminophen (Tylenol), Serum <10 (L) 10 - 30 ug/mL    Comment:        THERAPEUTIC CONCENTRATIONS VARY SIGNIFICANTLY. A RANGE OF 10-30 ug/mL MAY BE AN EFFECTIVE CONCENTRATION FOR MANY PATIENTS. HOWEVER, SOME ARE BEST TREATED AT CONCENTRATIONS OUTSIDE THIS RANGE. ACETAMINOPHEN CONCENTRATIONS >150 ug/mL AT 4 HOURS AFTER INGESTION AND >50 ug/mL AT 12 HOURS AFTER INGESTION ARE OFTEN ASSOCIATED WITH TOXIC REACTIONS.   Salicylate level     Status:  None   Collection Time: 03/23/17 10:27 PM  Result Value Ref Range   Salicylate Lvl <1.6 2.8 - 30.0 mg/dL  CBC with Differential     Status: Abnormal   Collection Time: 03/23/17 10:39 PM  Result Value Ref Range   WBC 20.5 (H) 4.0 - 10.5 K/uL   RBC 4.60 4.22 - 5.81 MIL/uL   Hemoglobin 14.6 13.0 - 17.0 g/dL   HCT 44.1 39.0 - 52.0 %   MCV 95.9 78.0 - 100.0 fL   MCH 31.7 26.0 - 34.0 pg   MCHC 33.1 30.0 - 36.0 g/dL   RDW 13.3 11.5 - 15.5 %   Platelets 210 150 - 400 K/uL   Neutrophils Relative % 87 %   Neutro Abs 17.9 (H) 1.7 - 7.7 K/uL   Lymphocytes Relative 8 %   Lymphs Abs 1.6 0.7 - 4.0 K/uL   Monocytes Relative 2 %   Monocytes Absolute 0.5  0.1 - 1.0 K/uL   Eosinophils Relative 3 %   Eosinophils Absolute 0.5 0.0 - 0.7 K/uL   Basophils Relative 0 %   Basophils Absolute 0.0 0.0 - 0.1 K/uL  Comprehensive metabolic panel     Status: Abnormal   Collection Time: 03/23/17 10:39 PM  Result Value Ref Range   Sodium 135 135 - 145 mmol/L   Potassium 4.5 3.5 - 5.1 mmol/L   Chloride 97 (L) 101 - 111 mmol/L   CO2 27 22 - 32 mmol/L   Glucose, Bld 151 (H) 65 - 99 mg/dL   BUN 17 6 - 20 mg/dL   Creatinine, Ser 1.38 (H) 0.61 - 1.24 mg/dL   Calcium 8.7 (L) 8.9 - 10.3 mg/dL   Total Protein 6.3 (L) 6.5 - 8.1 g/dL   Albumin 3.9 3.5 - 5.0 g/dL   AST 59 (H) 15 - 41 U/L   ALT 34 17 - 63 U/L   Alkaline Phosphatase 73 38 - 126 U/L   Total Bilirubin 0.4 0.3 - 1.2 mg/dL   GFR calc non Af Amer >60 >60 mL/min   GFR calc Af Amer >60 >60 mL/min    Comment: (NOTE) The eGFR has been calculated using the CKD EPI equation. This calculation has not been validated in all clinical situations. eGFR's persistently <60 mL/min signify possible Chronic Kidney Disease.    Anion gap 11 5 - 15  Protime-INR     Status: None   Collection Time: 03/23/17 10:39 PM  Result Value Ref Range   Prothrombin Time 13.1 11.4 - 15.2 seconds   INR 1.00   Type and screen [place]     Status: None    Collection Time: 03/23/17 10:50 PM  Result Value Ref Range   ABO/RH(D) A NEG    Antibody Screen NEG    Sample Expiration 03/26/2017   ABO/Rh     Status: None   Collection Time: 03/23/17 10:50 PM  Result Value Ref Range   ABO/RH(D) A NEG   Urine rapid drug screen (hosp performed)     Status: Abnormal   Collection Time: 03/24/17 12:00 AM  Result Value Ref Range   Opiates NONE DETECTED NONE DETECTED   Cocaine NONE DETECTED NONE DETECTED   Benzodiazepines NONE DETECTED NONE DETECTED   Amphetamines NONE DETECTED NONE DETECTED   Tetrahydrocannabinol POSITIVE (A) NONE DETECTED   Barbiturates NONE DETECTED NONE DETECTED    Comment: (NOTE) DRUG SCREEN FOR MEDICAL PURPOSES ONLY.  IF CONFIRMATION IS NEEDED FOR ANY PURPOSE, NOTIFY LAB WITHIN 5 DAYS. LOWEST DETECTABLE LIMITS FOR URINE DRUG SCREEN Drug Class                     Cutoff (ng/mL) Amphetamine and metabolites    1000 Barbiturate and metabolites    200 Benzodiazepine                 863 Tricyclics and metabolites     300 Opiates and metabolites        300 Cocaine and metabolites        300 THC                            50   HIV antibody (Routine Testing)     Status: None   Collection Time: 03/24/17  3:32 AM  Result Value Ref Range   HIV Screen 4th Generation wRfx Non Reactive Non Reactive    Comment: (NOTE)  Performed At: [facility] [place], [place] [id number] Rush Farmer MD LE:[id number]   CBC WITH DIFFERENTIAL     Status: Abnormal   Collection Time: 03/24/17  3:32 AM  Result Value Ref Range   WBC 21.4 (H) 4.0 - 10.5 K/uL   RBC 4.47 4.22 - 5.81 MIL/uL   Hemoglobin 14.2 13.0 - 17.0 g/dL   HCT 42.7 39.0 - 52.0 %   MCV 95.5 78.0 - 100.0 fL   MCH 31.8 26.0 - 34.0 pg   MCHC 33.3 30.0 - 36.0 g/dL   RDW 13.2 11.5 - 15.5 %   Platelets 171 150 - 400 K/uL   Neutrophils Relative % 94 %   Neutro Abs 20.0 (H) 1.7 - 7.7 K/uL   Lymphocytes Relative 2 %   Lymphs Abs 0.4 (L) 0.7 - 4.0 K/uL    Monocytes Relative 4 %   Monocytes Absolute 0.8 0.1 - 1.0 K/uL   Eosinophils Relative 0 %   Eosinophils Absolute 0.1 0.0 - 0.7 K/uL   Basophils Relative 0 %   Basophils Absolute 0.0 0.0 - 0.1 K/uL  Basic metabolic panel     Status: Abnormal   Collection Time: 03/24/17  3:32 AM  Result Value Ref Range   Sodium 137 135 - 145 mmol/L   Potassium 4.7 3.5 - 5.1 mmol/L   Chloride 101 101 - 111 mmol/L   CO2 25 22 - 32 mmol/L   Glucose, Bld 113 (H) 65 - 99 mg/dL   BUN 17 6 - 20 mg/dL   Creatinine, Ser 1.20 0.61 - 1.24 mg/dL   Calcium 8.6 (L) 8.9 - 10.3 mg/dL   GFR calc non Af Amer >60 >60 mL/min   GFR calc Af Amer >60 >60 mL/min    Comment: (NOTE) The eGFR has been calculated using the CKD EPI equation. This calculation has not been validated in all clinical situations. eGFR's persistently <60 mL/min signify possible Chronic Kidney Disease.    Anion gap 11 5 - 15  Troponin I     Status: None   Collection Time: 03/24/17  3:32 AM  Result Value Ref Range   Troponin I <0.03 <0.03 ng/mL  MRSA PCR Screening     Status: None   Collection Time: 03/24/17  2:15 PM  Result Value Ref Range   MRSA by PCR NEGATIVE NEGATIVE    Comment:        The GeneXpert MRSA Assay (FDA approved for NASAL specimens only), is one component of a comprehensive MRSA colonization surveillance program. It is not intended to diagnose MRSA infection nor to guide or monitor treatment for MRSA infections.   CBC     Status: Abnormal   Collection Time: 03/25/17  4:01 AM  Result Value Ref Range   WBC 15.3 (H) 4.0 - 10.5 K/uL   RBC 4.35 4.22 - 5.81 MIL/uL   Hemoglobin 13.8 13.0 - 17.0 g/dL   HCT 41.6 39.0 - 52.0 %   MCV 95.6 78.0 - 100.0 fL   MCH 31.7 26.0 - 34.0 pg   MCHC 33.2 30.0 - 36.0 g/dL   RDW 13.2 11.5 - 15.5 %   Platelets 158 150 - 400 K/uL  Basic metabolic panel     Status: Abnormal   Collection Time: 03/25/17  4:01 AM  Result Value Ref Range   Sodium 135 135 - 145 mmol/L   Potassium 4.5 3.5 - 5.1  mmol/L   Chloride 98 (L) 101 - 111 mmol/L   CO2 26 22 -  32 mmol/L   Glucose, Bld 100 (H) 65 - 99 mg/dL   BUN 8 6 - 20 mg/dL   Creatinine, Ser 1.09 0.61 - 1.24 mg/dL   Calcium 9.3 8.9 - 10.3 mg/dL   GFR calc non Af Amer >60 >60 mL/min   GFR calc Af Amer >60 >60 mL/min    Comment: (NOTE) The eGFR has been calculated using the CKD EPI equation. This calculation has not been validated in all clinical situations. eGFR's persistently <60 mL/min signify possible Chronic Kidney Disease.    Anion gap 11 5 - 15    Current Facility-Administered Medications  Medication Dose Route Frequency Provider Last Rate Last Dose  . 0.9 %  sodium chloride infusion  250 mL Intravenous PRN Opyd, Ilene Qua, MD      . acetaminophen (TYLENOL) tablet 650 mg  650 mg Oral Q6H PRN Opyd, Ilene Qua, MD   650 mg at 03/25/17 1221   Or  . acetaminophen (TYLENOL) suppository 650 mg  650 mg Rectal Q6H PRN Opyd, Ilene Qua, MD      . busPIRone (BUSPAR) tablet 5 mg  5 mg Oral BID Opyd, Ilene Qua, MD   5 mg at 03/25/17 1030  . escitalopram (LEXAPRO) tablet 20 mg  20 mg Oral Daily Opyd, Ilene Qua, MD   20 mg at 03/25/17 1030  . furosemide (LASIX) injection 60 mg  60 mg Intravenous Once Alekh, Kshitiz, MD      . guaiFENesin-dextromethorphan (ROBITUSSIN DM) 100-10 MG/5ML syrup 5 mL  5 mL Oral Q4H PRN Irene Pap N, DO   5 mL at 03/25/17 0301  . ketorolac (TORADOL) 30 MG/ML injection 30 mg  30 mg Intravenous Q6H PRN Opyd, Ilene Qua, MD      . LORazepam (ATIVAN) injection 0.5-1 mg  0.5-1 mg Intravenous Q4H PRN Opyd, Ilene Qua, MD   0.5 mg at 03/24/17 2208  . ondansetron (ZOFRAN) tablet 4 mg  4 mg Oral Q6H PRN Opyd, Ilene Qua, MD       Or  . ondansetron (ZOFRAN) injection 4 mg  4 mg Intravenous Q6H PRN Opyd, Ilene Qua, MD   4 mg at 03/24/17 0607  . pantoprazole (PROTONIX) EC tablet 40 mg  40 mg Oral Daily Opyd, Ilene Qua, MD   40 mg at 03/25/17 1030  . sodium chloride flush (NS) 0.9 % injection 3 mL  3 mL Intravenous Q12H Opyd,  Timothy S, MD      . sodium chloride flush (NS) 0.9 % injection 3 mL  3 mL Intravenous Q12H Opyd, Ilene Qua, MD   3 mL at 03/25/17 1032  . sodium chloride flush (NS) 0.9 % injection 3 mL  3 mL Intravenous PRN Opyd, Ilene Qua, MD      . traMADol (ULTRAM) tablet 100 mg  100 mg Oral Q6H PRN Opyd, Ilene Qua, MD   100 mg at 03/24/17 1642    Musculoskeletal: Strength & Muscle Tone: within normal limits Gait & Station: UTA since patient was lying in bed. Patient leans: N/A  Psychiatric Specialty Exam: Physical Exam  Nursing note and vitals reviewed. Constitutional: He is oriented to person, place, and time. He appears well-developed and well-nourished.  HENT:  Head: Normocephalic and atraumatic.  Neck: Normal range of motion.  Respiratory: Effort normal.  Musculoskeletal: Normal range of motion.  Neurological: He is alert and oriented to person, place, and time.  Skin: No rash noted.  Psychiatric: He has a normal mood and affect. His speech is normal and behavior  is normal. Thought content normal. Cognition and memory are normal. He expresses impulsivity.    Review of Systems  Constitutional: Negative for chills and fever.  Cardiovascular: Negative for chest pain.  Gastrointestinal: Negative for abdominal pain, constipation, diarrhea, nausea and vomiting.  Psychiatric/Behavioral: Positive for depression and substance abuse. Negative for hallucinations and suicidal ideas. The patient is nervous/anxious and has insomnia.     Blood pressure (!) 133/98, pulse 91, temperature 98.5 F (36.9 C), temperature source Oral, resp. rate 15, height _0  (1.778 m), weight 78.2 kg (172 lb 6.4 oz), SpO2 95 %.Body mass index is 24.74 kg/m.  General Appearance: Well Groomed, young, Caucasian male with a neat beard, hospital gown and lying in bed. NAD.   Eye Contact:  Good  Speech:  Clear and Coherent and Normal Rate  Volume:  Normal  Mood:  Anxious  Affect:  Appropriate and Full Range  Thought  Process:  Goal Directed and Linear  Orientation:  Full (Time, Place, and Person)  Thought Content:  Logical  Suicidal Thoughts:  No  Homicidal Thoughts:  No  Memory:  Immediate;   Good Recent;   Good Remote;   Good  Judgement:  Fair  Insight:  Fair  Psychomotor Activity:  Normal  Concentration:  Concentration: Good and Attention Span: Good  Recall:  Good  Fund of Knowledge:  Good  Language:  Good  Akathisia:  No  Handed:  Right  AIMS (if indicated):   N/A  Assets:  Communication Skills Social Support  ADL's:  Intact  Cognition:  WNL  Sleep:   Recently poor   Assessment:  Corey Rivera is a 22 y.o. male with acute pulmonary edema with acute hypoxic respiratory failure. He reports significant substance use which is problematic. He would benefit from substance abuse treatment (inpatient versus IOP). He seems to be in the contemplation phase but would benefit from receiving resources in the community. He reports depressive and anxiety symptoms in the setting of multiple psychosocial stressors. He denies SI, HI or AVH.   Treatment Plan Summary: -Continue Lexapro 20 mg daily. -Continue Buspar 5 mg BID for mood augmentation and anxiety. -Start Melatonin 3-6 mg qhs for insomnia. If ineffective, start Trazodone 50 mg qhs PRN. -Patient reports that unit SW has provided him with resources for substance abuse treatment. -Patient should follow up with his outpatient mental health provider for further medication management.  -Psychiatry will sign off patient at this time. Please consult psychiatry again as needed.   Disposition: No evidence of imminent risk to self or others at present.   Patient does not meet criteria for psychiatric inpatient admission.  Faythe Dingwall, DO 03/25/2017 1:25 PM

## 2017-03-25 NOTE — Procedures (Signed)
ELECTROENCEPHALOGRAM REPORT  Date of Study: 03/25/2017  Patient's Name: Corey Rivera MRN: [id number] Date of Birth: Nov 29, 1995  Referring Provider: Dr. Glade Lloyd  Clinical History: This is a 22 year old man with seizure.  Medications: No AEDs  Technical Summary: A multichannel digital EEG recording measured by the international 10-20 system with electrodes applied with paste and impedances below 5000 ohms performed in our laboratory with EKG monitoring in an awake and asleep patient.  Hyperventilation and photic stimulation were not performed.  The digital EEG was referentially recorded, reformatted, and digitally filtered in a variety of bipolar and referential montages for optimal display.    Description: The patient is awake and asleep during the recording.  During maximal wakefulness, there is a symmetric, medium voltage 9-10 Hz posterior dominant rhythm that attenuates with eye opening.  The record is symmetric.  During drowsiness and sleep, there is an increase in theta slowing of the background.  Vertex waves and symmetric sleep spindles were seen.  Hyperventilation and photic stimulation were not performed.  There were no epileptiform discharges or electrographic seizures seen.    EKG lead was unremarkable.  Impression: This awake and asleep EEG is normal.    Clinical Correlation: A normal EEG does not exclude a clinical diagnosis of epilepsy.  Clinical correlation is advised.   Patrcia Dolly, M.D.

## 2017-03-25 NOTE — Progress Notes (Signed)
  Echocardiogram 2D Echocardiogram has been performed.  Corey Rivera [name] 03/25/2017, 11:29 AM

## 2017-03-25 NOTE — Progress Notes (Signed)
Attempted many visits yet patient had company.  Was able to see patient minus other visitors and he asked what is a Orthoptist- After expressing like a pastor of a church except in the hospital setting.  He was very open and interested in having prayer (higher power) to help him along his journey.   Phebe Colla, Chaplain   03/25/17 1500  Clinical Encounter Type  Visited With Patient  Visit Type Initial  Referral From Physician  Consult/Referral To Chaplain  Spiritual Encounters  Spiritual Needs Prayer;Emotional;Other (Comment) (guidance in general, encouragement and prayer)

## 2017-03-25 NOTE — Progress Notes (Signed)
Pt unavailable for EEG at this time, he is having an Echo

## 2017-03-25 NOTE — Progress Notes (Signed)
EEG completed, results pending. 

## 2017-03-25 NOTE — Progress Notes (Signed)
Patient had 2 family  Members present. I believe he wanted to visit one on one without family present.  Will come back later to visit hopefully can meet as he would like later today. Phebe Colla, Chaplain   03/25/17 1100  Clinical Encounter Type  Visited With Patient and family together (grandmother and aunt with patient)  Visit Type Initial  Referral From Physician  Consult/Referral To Chaplain

## 2017-03-26 LAB — BASIC METABOLIC PANEL
ANION GAP: 10 (ref 5–15)
BUN: 8 mg/dL (ref 6–20)
CALCIUM: 10 mg/dL (ref 8.9–10.3)
CO2: 29 mmol/L (ref 22–32)
CREATININE: 1.21 mg/dL (ref 0.61–1.24)
Chloride: 98 mmol/L — ABNORMAL LOW (ref 101–111)
GLUCOSE: 115 mg/dL — AB (ref 65–99)
Potassium: 4.1 mmol/L (ref 3.5–5.1)
Sodium: 137 mmol/L (ref 135–145)

## 2017-03-26 LAB — CBC WITH DIFFERENTIAL/PLATELET
BASOS ABS: 0 10*3/uL (ref 0.0–0.1)
BASOS PCT: 0 %
EOS PCT: 8 %
Eosinophils Absolute: 0.9 10*3/uL — ABNORMAL HIGH (ref 0.0–0.7)
HEMATOCRIT: 45.3 % (ref 39.0–52.0)
Hemoglobin: 15.7 g/dL (ref 13.0–17.0)
Lymphocytes Relative: 11 %
Lymphs Abs: 1.2 10*3/uL (ref 0.7–4.0)
MCH: 32.4 pg (ref 26.0–34.0)
MCHC: 34.7 g/dL (ref 30.0–36.0)
MCV: 93.6 fL (ref 78.0–100.0)
MONO ABS: 0.8 10*3/uL (ref 0.1–1.0)
MONOS PCT: 7 %
Neutro Abs: 8.3 10*3/uL — ABNORMAL HIGH (ref 1.7–7.7)
Neutrophils Relative %: 74 %
PLATELETS: 182 10*3/uL (ref 150–400)
RBC: 4.84 MIL/uL (ref 4.22–5.81)
RDW: 12.9 % (ref 11.5–15.5)
WBC: 11.2 10*3/uL — ABNORMAL HIGH (ref 4.0–10.5)

## 2017-03-26 LAB — MAGNESIUM: Magnesium: 2.2 mg/dL (ref 1.7–2.4)

## 2017-03-26 NOTE — Discharge Summary (Signed)
Physician Discharge Summary  Corey Rivera UVO:[id number] DOB: 1995/10/11 DOA: 03/23/2017  PCP: Waldon Merl, PA-C  Admit date: 03/23/2017 Discharge date: 03/26/2017  Admitted From: Home Disposition: Home  Recommendations for Outpatient Follow-up:  1. Follow up with PCP in 1week 2. Patient will benefit from outpatient evaluation and follow-up with a psychiatrist 3. Abstain from illicit drugs  Home Health: No Equipment/Devices: None  Discharge Condition: Guarded CODE STATUS: Full Diet recommendation: Heart Healthy  Brief/Interim Summary: 22 year old male with history of polysubstance abuse, depression and anxiety presented with generalized seizure at home.  Patient had snorted oxycodone at home, had become unresponsive and had generalized seizure.  EMS administered Narcan and patient became more awake.  He was also found to be in acute pulmonary edema.  He was initially requiring supplemental oxygen, he needed 1 dose of intravenous Lasix.  He is currently on room air.  No more seizure activities since hospitalization.  EEG was unremarkable.  Psychiatry has cleared the patient for discharge.  He is medically stable for discharge.  Will need to follow-up as an outpatient for substance abuse treatment.    Discharge Diagnoses:  Principal Problem:   Polysubstance abuse (HCC) Active Problems:   Anxiety and depression   Acute pulmonary edema (HCC)   Seizure (HCC)   Acute respiratory failure with hypoxia (HCC)    1.Acute pulmonary edema with acute hypoxic respiratory failure -Likely neurogenic in setting of generalized seizure -Respiratory status has improved.  Currently on room air.  Patient received 1 dose of intravenous Lasix yesterday. Patient is diuresing well.  -2D echo showed EF of 60-65% -Outpatient follow-up with primary care provider.  Abstain from snorting oxycodone or using any other illicit drugs.  2.Generalized seizure -Presents following a generalized  seizure at home -He has hx of generalized seizure in setting of benzodiazepine withdrawal -Probably secondary to opiate overdose.  No further seizures since admission.  Currently mental status has much improved.  CT head was negative for acute intracranial events -EEG was unremarkable  -Outpatient follow-up with primary care provider and/or neurology.    3.Suspected narcotic overdose in a patient with history of polysubstance abuse and depression/anxiety -Counseled toward cessation - SW consultationappreciated.  Patient will need to follow-up as an outpatient for substance abuse treatment. -Psychiatry consultation appreciated.  No need for inpatient psychiatric hospitalization.  Outpatient follow-up with psychiatry  4.Mild renal insufficiency -Resolved.  5.  Leukocytosis -Probably reactive.  Improving.     Discharge Instructions  Discharge Instructions    Ambulatory referral to Psychiatry   Complete by:  As directed    Recent hospitalization for overdose/severe depression   Call MD for:  difficulty breathing, headache or visual disturbances   Complete by:  As directed    Call MD for:  extreme fatigue   Complete by:  As directed    Call MD for:  hives   Complete by:  As directed    Call MD for:  persistant dizziness or light-headedness   Complete by:  As directed    Call MD for:  persistant nausea and vomiting   Complete by:  As directed    Call MD for:  severe uncontrolled pain   Complete by:  As directed    Call MD for:  temperature >100.4   Complete by:  As directed    Diet - low sodium heart healthy   Complete by:  As directed    Increase activity slowly   Complete by:  As directed      Allergies  as of 03/26/2017   No Known Allergies     Medication List    TAKE these medications   busPIRone 5 MG tablet Commonly known as:  BUSPAR Take 1 tablet (5 mg total) by mouth 2 (two) times daily.   escitalopram 20 MG tablet Commonly known as:   LEXAPRO Take 1 tablet (20 mg total) by mouth daily.   pantoprazole 40 MG tablet Commonly known as:  PROTONIX Take 1 tablet (40 mg total) by mouth daily.      Follow-up Information    Waldon Merl, PA-C. Schedule an appointment as soon as possible for a visit in 1 week(s).   Specialty:  Family Medicine Contact information: [address] [place] [address] [phone]          No Known Allergies  Consultations:  Psychiatry   Procedures/Studies: Dg Chest 2 View  Result Date: 03/25/2017 CLINICAL DATA:  22 year old male with history of hemoptysis. Seizures. Overdose on opioids. EXAM: CHEST  2 VIEW COMPARISON:  Chest x-ray 03/23/2017. FINDINGS: Mild diffuse ground-glass attenuation predominantly throughout the central aspects of the lungs. Lung volumes are normal. No consolidative airspace disease. No pleural effusions. No pneumothorax. No pulmonary nodule or mass noted. Pulmonary vasculature and the cardiomediastinal silhouette are within normal limits. IMPRESSION: 1. Slight improvement in diffuse ground-glass attenuation noted throughout the central aspects of the lungs bilaterally. Given the patient's history of hemoptysis, this likely reflects areas of resolving alveolar hemorrhage. This may also reflect some degree of noncardiogenic pulmonary edema. Electronically Signed   By: Trudie Reed M.D.   On: 03/25/2017 11:53   Dg Ribs Unilateral W/chest Left  Result Date: 03/10/2017 CLINICAL DATA:  The patient had a fall or fall yesterday. The patient complains of pleuritic left lower lateral ribcage pain with shortness of breath. EXAM: LEFT RIBS AND CHEST - 3+ VIEW COMPARISON:  Chest x-ray of January 07, 2010 FINDINGS: The lungs are well-expanded and clear. There is no evidence of a pulmonary contusion, pneumothorax, or pleural effusion. The heart and mediastinal structures are normal. The pulmonary vascularity is not engorged. Left rib images include a metallic BB over  the lower lateral ribcage. No acute rib fracture is observed. IMPRESSION: No acute left rib fracture is demonstrated. There is no acute cardiopulmonary abnormality. Electronically Signed   By: David  [place] M.D.   On: 03/10/2017 15:12   Ct Head Wo Contrast  Result Date: 03/24/2017 CLINICAL DATA:  Greater than mall seizure after snorting Roxicodone. Vomiting. EXAM: CT HEAD WITHOUT CONTRAST TECHNIQUE: Contiguous axial images were obtained from the base of the skull through the vertex without intravenous contrast. COMPARISON:  CT HEAD March 10, 2017 FINDINGS: BRAIN: No intraparenchymal hemorrhage, mass effect nor midline shift. The ventricles and sulci are normal. No acute large vascular territory infarcts. No abnormal extra-axial fluid collections. 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 Basal cisterns are patent. VASCULAR: Unremarkable. SKULL/SOFT TISSUES: No skull fracture. No significant soft tissue swelling. ORBITS/SINUSES: The included ocular globes and orbital contents are normal.Mild paranasal sinus mucosal thickening. Small RIGHT mastoid effusion without air cell coalescence. OTHER: None. IMPRESSION: Stable negative noncontrast CT HEAD. Electronically Signed   By: Awilda Metro M.D.   On: 03/24/2017 01:36   Ct Head Wo Contrast  Result Date: 03/10/2017 CLINICAL DATA:  Posttraumatic headache and right-sided facial injury after multiple falls last night. EXAM: CT HEAD WITHOUT CONTRAST CT MAXILLOFACIAL WITHOUT CONTRAST CT CERVICAL SPINE WITHOUT CONTRAST TECHNIQUE: Multidetector CT imaging of the  head, cervical spine, and maxillofacial structures were performed using the standard protocol without intravenous contrast. Multiplanar CT image reconstructions of the cervical spine and maxillofacial structures were also generated. COMPARISON:  None. FINDINGS: CT HEAD FINDINGS Brain: No evidence of acute infarction, hemorrhage, hydrocephalus, extra-axial collection or  mass lesion/mass effect. Vascular: No hyperdense vessel or unexpected calcification. Skull: Normal. Negative for fracture or focal lesion. Other: None. CT MAXILLOFACIAL FINDINGS Osseous: No fracture or mandibular dislocation. No destructive process. Orbits: Negative. No traumatic or inflammatory finding. Sinuses: Mild bilateral ethmoid and maxillary sinusitis is noted. Soft tissues: Negative. CT CERVICAL SPINE FINDINGS Alignment: Normal. Skull base and vertebrae: No acute fracture. No primary bone lesion or focal pathologic process. Soft tissues and spinal canal: No prevertebral fluid or swelling. No visible canal hematoma. Disc levels:  Normal. Upper chest: Negative. Other: None. IMPRESSION: Normal head CT. Mild bilateral ethmoid and maxillary sinusitis. No other abnormality seen in maxillofacial region. Normal cervical spine. Electronically Signed   By: Lupita Raider, M.D.   On: 03/10/2017 15:34   Ct Cervical Spine Wo Contrast  Result Date: 03/10/2017 CLINICAL DATA:  Posttraumatic headache and right-sided facial injury after multiple falls last night. EXAM: CT HEAD WITHOUT CONTRAST CT MAXILLOFACIAL WITHOUT CONTRAST CT CERVICAL SPINE WITHOUT CONTRAST TECHNIQUE: Multidetector CT imaging of the head, cervical spine, and maxillofacial structures were performed using the standard protocol without intravenous contrast. Multiplanar CT image reconstructions of the cervical spine and maxillofacial structures were also generated. COMPARISON:  None. FINDINGS: CT HEAD FINDINGS Brain: No evidence of acute infarction, hemorrhage, hydrocephalus, extra-axial collection or mass lesion/mass effect. Vascular: No hyperdense vessel or unexpected calcification. Skull: Normal. Negative for fracture or focal lesion. Other: None. CT MAXILLOFACIAL FINDINGS Osseous: No fracture or mandibular dislocation. No destructive process. Orbits: Negative. No traumatic or inflammatory finding. Sinuses: Mild bilateral ethmoid and maxillary  sinusitis is noted. Soft tissues: Negative. CT CERVICAL SPINE FINDINGS Alignment: Normal. Skull base and vertebrae: No acute fracture. No primary bone lesion or focal pathologic process. Soft tissues and spinal canal: No prevertebral fluid or swelling. No visible canal hematoma. Disc levels:  Normal. Upper chest: Negative. Other: None. IMPRESSION: Normal head CT. Mild bilateral ethmoid and maxillary sinusitis. No other abnormality seen in maxillofacial region. Normal cervical spine. Electronically Signed   By: Lupita Raider, M.D.   On: 03/10/2017 15:34   Dg Chest Portable 1 View  Result Date: 03/23/2017 CLINICAL DATA:  grand mal seizure episode this evening after snorting Roxicodone , received Narcan 0.5 mg by EMS prior to arrival , alert and oriented at arrival , emesis and mild oral bleeding at arrival. EXAM: PORTABLE CHEST 1 VIEW COMPARISON:  Chest x-ray dated 03/10/2017. FINDINGS: Bilateral perihilar and lower lobe pulmonary edema. No pleural effusion or pneumothorax seen. Heart size is within normal limits. Osseous structures about the chest are unremarkable. IMPRESSION: Bilateral pulmonary edema. Electronically Signed   By: Bary Richard M.D.   On: 03/23/2017 22:52   Dg Femur Min 2 Views Left  Result Date: 03/10/2017 CLINICAL DATA:  Fall EXAM: LEFT FEMUR 2 VIEWS COMPARISON:  None. FINDINGS: There is no evidence of fracture or other focal bone lesions. Soft tissues are unremarkable. IMPRESSION: Normal left hip and femur. Electronically Signed   By: Deatra Robinson M.D.   On: 03/10/2017 15:14   Dg Femur Min 2 Views Right  Result Date: 03/10/2017 CLINICAL DATA:  Fall, tenderness/swelling EXAM: RIGHT FEMUR 2 VIEWS COMPARISON:  None. FINDINGS: No fracture or dislocation is seen. The joint spaces are  preserved. Visualized soft tissues are within normal limits. IMPRESSION: Negative. Electronically Signed   By: Charline Bills M.D.   On: 03/10/2017 15:11   Ct Maxillofacial Wo Contrast  Result Date:  03/10/2017 CLINICAL DATA:  Posttraumatic headache and right-sided facial injury after multiple falls last night. EXAM: CT HEAD WITHOUT CONTRAST CT MAXILLOFACIAL WITHOUT CONTRAST CT CERVICAL SPINE WITHOUT CONTRAST TECHNIQUE: Multidetector CT imaging of the head, cervical spine, and maxillofacial structures were performed using the standard protocol without intravenous contrast. Multiplanar CT image reconstructions of the cervical spine and maxillofacial structures were also generated. COMPARISON:  None. FINDINGS: CT HEAD FINDINGS Brain: No evidence of acute infarction, hemorrhage, hydrocephalus, extra-axial collection or mass lesion/mass effect. Vascular: No hyperdense vessel or unexpected calcification. Skull: Normal. Negative for fracture or focal lesion. Other: None. CT MAXILLOFACIAL FINDINGS Osseous: No fracture or mandibular dislocation. No destructive process. Orbits: Negative. No traumatic or inflammatory finding. Sinuses: Mild bilateral ethmoid and maxillary sinusitis is noted. Soft tissues: Negative. CT CERVICAL SPINE FINDINGS Alignment: Normal. Skull base and vertebrae: No acute fracture. No primary bone lesion or focal pathologic process. Soft tissues and spinal canal: No prevertebral fluid or swelling. No visible canal hematoma. Disc levels:  Normal. Upper chest: Negative. Other: None. IMPRESSION: Normal head CT. Mild bilateral ethmoid and maxillary sinusitis. No other abnormality seen in maxillofacial region. Normal cervical spine. Electronically Signed   By: Lupita Raider, M.D.   On: 03/10/2017 15:34    Echo on 03/25/2017 Study Conclusions  - Left ventricle: The cavity size was normal. Systolic function was   normal. The estimated ejection fraction was in the range of 60%   to 65%. Wall motion was normal; there were no regional wall   motion abnormalities. There was an increased relative   contribution of atrial contraction to ventricular filling.   Doppler parameters are consistent with  abnormal left ventricular   relaxation (grade 1 diastolic dysfunction).  EEG on 03/25/2017 was normal  Subjective: Patient seen and examined at bedside.  He denies any overnight fever, shortness of breath or cough.  He is feeling better.  Discharge Exam: Vitals:   03/25/17 2117 03/26/17 0351  BP: (!) 147/90   Pulse:    Resp:  20  Temp:  98.9 F (37.2 C)  SpO2:  95%   Vitals:   03/25/17 1800 03/25/17 2115 03/25/17 2117 03/26/17 0351  BP: 125/76 113/80 (!) 147/90   Pulse: 81     Resp: 18 20  20   Temp: 98.9 F (37.2 C) 98.9 F (37.2 C)  98.9 F (37.2 C)  TempSrc: Oral Oral  Oral  SpO2: 97% 94%  95%  Weight:    75.1 kg (165 lb 9.6 oz)  Height:        General: Pt is alert, awake, not in acute distress Cardiovascular: Rate controlled, S1/S2 + Respiratory: Bilateral decreased breath sounds at bases Abdominal: Soft, NT, ND, bowel sounds + Extremities: no edema, no cyanosis    The results of significant diagnostics from this hospitalization (including imaging, microbiology, ancillary and laboratory) are listed below for reference.     Microbiology: Recent Results (from the past 240 hour(s))  MRSA PCR Screening     Status: None   Collection Time: 03/24/17  2:15 PM  Result Value Ref Range Status   MRSA by PCR NEGATIVE NEGATIVE Final    Comment:        The GeneXpert MRSA Assay (FDA approved for NASAL specimens only), is one component of a comprehensive MRSA colonization surveillance  program. It is not intended to diagnose MRSA infection nor to guide or monitor treatment for MRSA infections.      Labs: BNP (last 3 results) No results for input(s): BNP in the last 8760 hours. Basic Metabolic Panel: Recent Labs  Lab 03/23/17 2239 03/24/17 0332 03/25/17 0401 03/26/17 0440  NA 135 137 135 137  K 4.5 4.7 4.5 4.1  CL 97* 101 98* 98*  CO2 27 25 26 29   GLUCOSE 151* 113* 100* 115*  BUN 17 17 8 8   CREATININE 1.38* 1.20 1.09 1.21  CALCIUM 8.7* 8.6* 9.3 10.0  MG   --   --   --  2.2   Liver Function Tests: Recent Labs  Lab 03/23/17 2239  AST 59*  ALT 34  ALKPHOS 73  BILITOT 0.4  PROT 6.3*  ALBUMIN 3.9   No results for input(s): LIPASE, AMYLASE in the last 168 hours. No results for input(s): AMMONIA in the last 168 hours. CBC: Recent Labs  Lab 03/23/17 2239 03/24/17 0332 03/25/17 0401 03/26/17 0440  WBC 20.5* 21.4* 15.3* 11.2*  NEUTROABS 17.9* 20.0*  --  8.3*  HGB 14.6 14.2 13.8 15.7  HCT 44.1 42.7 41.6 45.3  MCV 95.9 95.5 95.6 93.6  PLT 210 171 158 182   Cardiac Enzymes: Recent Labs  Lab 03/24/17 0332  TROPONINI <0.03   BNP: Invalid input(s): POCBNP CBG: No results for input(s): GLUCAP in the last 168 hours. D-Dimer No results for input(s): DDIMER in the last 72 hours. Hgb A1c No results for input(s): HGBA1C in the last 72 hours. Lipid Profile No results for input(s): CHOL, HDL, LDLCALC, TRIG, CHOLHDL, LDLDIRECT in the last 72 hours. Thyroid function studies No results for input(s): TSH, T4TOTAL, T3FREE, THYROIDAB in the last 72 hours.  Invalid input(s): FREET3 Anemia work up No results for input(s): VITAMINB12, FOLATE, FERRITIN, TIBC, IRON, RETICCTPCT in the last 72 hours. Urinalysis    Component Value Date/Time   COLORURINE YELLOW 03/10/2017 1551   APPEARANCEUR CLEAR 03/10/2017 1551   LABSPEC 1.020 03/10/2017 1551   PHURINE 7.0 03/10/2017 1551   GLUCOSEU NEGATIVE 03/10/2017 1551   HGBUR NEGATIVE 03/10/2017 1551   BILIRUBINUR NEGATIVE 03/10/2017 1551   KETONESUR NEGATIVE 03/10/2017 1551   PROTEINUR NEGATIVE 03/10/2017 1551   NITRITE NEGATIVE 03/10/2017 1551   LEUKOCYTESUR NEGATIVE 03/10/2017 1551   Sepsis Labs Invalid input(s): PROCALCITONIN,  WBC,  LACTICIDVEN Microbiology Recent Results (from the past 240 hour(s))  MRSA PCR Screening     Status: None   Collection Time: 03/24/17  2:15 PM  Result Value Ref Range Status   MRSA by PCR NEGATIVE NEGATIVE Final    Comment:        The GeneXpert MRSA Assay  (FDA approved for NASAL specimens only), is one component of a comprehensive MRSA colonization surveillance program. It is not intended to diagnose MRSA infection nor to guide or monitor treatment for MRSA infections.      Time coordinating discharge: 35 minutes  SIGNED:   Glade Lloyd, MD  [facility] 03/26/2017, 9:55 AM Pager: [phone]  If 7PM-7AM, please contact night-coverage [email or web address] Password [email or web address]

## 2017-03-26 NOTE — Progress Notes (Signed)
CSW met patients aunt and grandmother at bedside. Patient was sound asleep during conversation with family. Family stated they are wanting to place patient into a rehab treatment facility because they are afraid that once he gets home he will change his mind and return to drinking. CSW gave family more resources on rehab facility in the area. CSW signing off    Rhea Pink, MSW,  [place] [phone]

## 2017-03-26 NOTE — Care Management Note (Signed)
Case Management Note Original Note Created Leone Haven, RN 03/24/2017, 3:54 PM   Patient Details  Name: Gideon Starcher MRN: [id number] Date of Birth: Jan 23, 1996  Subjective/Objective:   From home alone, pta indep,presents with  Acute pulmonary edema, acute hypoxic resp failure, seizure, suspected narcotic OD, psa, and mild renal insufficiency.                   Action/Plan: NCM will follow for dc needs.   Expected Discharge Date:  03/26/17               Expected Discharge Plan:  Home/Self Care  In-House Referral:  Clinical Social Work  Discharge planning Services  CM Consult  Post Acute Care Choice:  NA Choice offered to:  NA  DME Arranged:    DME Agency:     HH Arranged:    HH Agency:     Status of Service:  Completed, signed off  If discussed at [employer], dates discussed:    Additional Comments:  03/26/17- 1120- Taysen Bushart RN, CM- pt for d/c home today- no CM needs noted for transition to home, CSW has seen pt for resources  Darrold Span, RN 03/26/2017, 11:25 AM [phone]

## 2017-03-26 NOTE — Progress Notes (Signed)
[name] to be D/C'd Home per MD order. Discussed with the patient and all questions fully answered.    VVS, Skin clean, dry and intact without evidence of skin break down, no evidence of skin tears noted.  IV catheter discontinued intact. Site without signs and symptoms of complications. Dressing and pressure applied.  An After Visit Summary was printed and given to the patient.  Patient escorted via WC, and D/C home via private auto.  Kai Levins  03/26/2017 10:13 AM

## 2017-03-27 ENCOUNTER — Telehealth: Payer: Self-pay

## 2017-03-27 NOTE — Telephone Encounter (Signed)
Transition Care Management Follow-up Telephone Call   Date discharged? 03/26/2017   How have you been since you were released from the hospital? "I'm fine"   Do you understand why you were in the hospital? yes   Do you understand the discharge instructions? yes   Where were you discharged to? Home     Items Reviewed:  Medications reviewed: yes  Allergies reviewed: yes  Dietary changes reviewed: yes  Referrals reviewed: Pt states no referral to [facility].    Functional Questionnaire:   Activities of Daily Living (ADLs):   He states they are independent in the following: ambulation, bathing and hygiene, feeding, continence, grooming, toileting and dressing States they require assistance with the following: None   Any transportation issues/concerns?: no   Any patient concerns? no   Confirmed importance and date/time of follow-up visits scheduled yes  Provider Appointment booked with PCP 04/03/2017 @ 11am.   Confirmed with patient if condition begins to worsen call PCP or go to the ER.  Patient was given the office number and encouraged to call back with question or concerns.  : yes

## 2017-03-27 NOTE — Telephone Encounter (Signed)
Attempted to reach patient to complete TCM and schedule hospital f/u, VM not available.

## 2017-04-03 ENCOUNTER — Inpatient Hospital Stay: Payer: Self-pay | Admitting: Physician Assistant

## 2020-11-26 ENCOUNTER — Ambulatory Visit: Payer: Managed Care, Other (non HMO) | Admitting: Physician Assistant

## 2021-01-25 DIAGNOSIS — Z811 Family history of alcohol abuse and dependence: Secondary | ICD-10-CM | POA: Diagnosis not present

## 2021-01-25 DIAGNOSIS — F19231 Other psychoactive substance dependence with withdrawal delirium: Secondary | ICD-10-CM | POA: Diagnosis not present

## 2021-01-25 DIAGNOSIS — T50901A Poisoning by unspecified drugs, medicaments and biological substances, accidental (unintentional), initial encounter: Secondary | ICD-10-CM | POA: Diagnosis not present

## 2021-01-25 DIAGNOSIS — Z20822 Contact with and (suspected) exposure to covid-19: Secondary | ICD-10-CM | POA: Diagnosis not present

## 2021-01-25 DIAGNOSIS — F17210 Nicotine dependence, cigarettes, uncomplicated: Secondary | ICD-10-CM | POA: Diagnosis not present

## 2021-01-25 DIAGNOSIS — F1990 Other psychoactive substance use, unspecified, uncomplicated: Secondary | ICD-10-CM | POA: Diagnosis not present

## 2021-01-25 DIAGNOSIS — F1120 Opioid dependence, uncomplicated: Secondary | ICD-10-CM | POA: Diagnosis not present

## 2021-01-25 DIAGNOSIS — R0981 Nasal congestion: Secondary | ICD-10-CM | POA: Diagnosis not present

## 2021-01-25 DIAGNOSIS — F15259 Other stimulant dependence with stimulant-induced psychotic disorder, unspecified: Secondary | ICD-10-CM | POA: Diagnosis not present

## 2021-01-25 DIAGNOSIS — B1920 Unspecified viral hepatitis C without hepatic coma: Secondary | ICD-10-CM | POA: Diagnosis not present

## 2021-01-25 DIAGNOSIS — R410 Disorientation, unspecified: Secondary | ICD-10-CM | POA: Diagnosis not present

## 2021-01-25 DIAGNOSIS — R7989 Other specified abnormal findings of blood chemistry: Secondary | ICD-10-CM | POA: Diagnosis not present

## 2021-01-25 DIAGNOSIS — R251 Tremor, unspecified: Secondary | ICD-10-CM | POA: Diagnosis not present

## 2021-01-25 DIAGNOSIS — G9340 Encephalopathy, unspecified: Secondary | ICD-10-CM | POA: Diagnosis not present

## 2021-01-25 DIAGNOSIS — R4182 Altered mental status, unspecified: Secondary | ICD-10-CM | POA: Diagnosis not present

## 2021-01-25 DIAGNOSIS — F29 Unspecified psychosis not due to a substance or known physiological condition: Secondary | ICD-10-CM | POA: Diagnosis not present

## 2021-01-25 DIAGNOSIS — F1920 Other psychoactive substance dependence, uncomplicated: Secondary | ICD-10-CM | POA: Diagnosis not present

## 2021-01-25 DIAGNOSIS — F1123 Opioid dependence with withdrawal: Secondary | ICD-10-CM | POA: Diagnosis not present

## 2021-01-25 DIAGNOSIS — F918 Other conduct disorders: Secondary | ICD-10-CM | POA: Diagnosis not present

## 2021-01-25 DIAGNOSIS — R441 Visual hallucinations: Secondary | ICD-10-CM | POA: Diagnosis not present

## 2021-01-25 DIAGNOSIS — I10 Essential (primary) hypertension: Secondary | ICD-10-CM | POA: Diagnosis not present

## 2021-01-26 DIAGNOSIS — R4182 Altered mental status, unspecified: Secondary | ICD-10-CM | POA: Diagnosis not present

## 2021-01-26 DIAGNOSIS — F15259 Other stimulant dependence with stimulant-induced psychotic disorder, unspecified: Secondary | ICD-10-CM | POA: Diagnosis not present

## 2021-01-26 DIAGNOSIS — F1920 Other psychoactive substance dependence, uncomplicated: Secondary | ICD-10-CM | POA: Diagnosis not present

## 2021-01-26 DIAGNOSIS — F1120 Opioid dependence, uncomplicated: Secondary | ICD-10-CM | POA: Diagnosis not present

## 2021-01-26 DIAGNOSIS — F29 Unspecified psychosis not due to a substance or known physiological condition: Secondary | ICD-10-CM | POA: Diagnosis not present

## 2021-04-05 ENCOUNTER — Inpatient Hospital Stay (HOSPITAL_COMMUNITY)
Admit: 2021-04-05 | Discharge: 2021-04-05 | Disposition: A | Discharge status: Home / Self Care | Payer: BC Managed Care – PPO | Attending: Pulmonary Disease | Admitting: Pulmonary Disease

## 2021-04-05 ENCOUNTER — Inpatient Hospital Stay (HOSPITAL_COMMUNITY): Payer: BC Managed Care – PPO

## 2021-04-05 ENCOUNTER — Inpatient Hospital Stay (HOSPITAL_COMMUNITY)
Admission: EM | Admit: 2021-04-05 | Discharge: 2021-04-10 | DRG: 917 | Disposition: A | Discharge status: Home / Self Care | Payer: BC Managed Care – PPO | Attending: Family Medicine | Admitting: Family Medicine

## 2021-04-05 ENCOUNTER — Emergency Department (HOSPITAL_COMMUNITY): Payer: BC Managed Care – PPO

## 2021-04-05 ENCOUNTER — Other Ambulatory Visit: Payer: Self-pay

## 2021-04-05 ENCOUNTER — Encounter (HOSPITAL_COMMUNITY): Payer: Self-pay

## 2021-04-05 DIAGNOSIS — F1310 Sedative, hypnotic or anxiolytic abuse, uncomplicated: Secondary | ICD-10-CM | POA: Diagnosis present

## 2021-04-05 DIAGNOSIS — E871 Hypo-osmolality and hyponatremia: Secondary | ICD-10-CM | POA: Diagnosis present

## 2021-04-05 DIAGNOSIS — R7689 Other specified abnormal immunological findings in serum: Secondary | ICD-10-CM | POA: Diagnosis present

## 2021-04-05 DIAGNOSIS — W1839XA Other fall on same level, initial encounter: Secondary | ICD-10-CM | POA: Diagnosis present

## 2021-04-05 DIAGNOSIS — J9601 Acute respiratory failure with hypoxia: Secondary | ICD-10-CM | POA: Diagnosis present

## 2021-04-05 DIAGNOSIS — R159 Full incontinence of feces: Secondary | ICD-10-CM | POA: Diagnosis present

## 2021-04-05 DIAGNOSIS — F1210 Cannabis abuse, uncomplicated: Secondary | ICD-10-CM | POA: Diagnosis present

## 2021-04-05 DIAGNOSIS — T40711A Poisoning by cannabis, accidental (unintentional), initial encounter: Secondary | ICD-10-CM | POA: Diagnosis present

## 2021-04-05 DIAGNOSIS — I63542 Cerebral infarction due to unspecified occlusion or stenosis of left cerebellar artery: Secondary | ICD-10-CM | POA: Diagnosis present

## 2021-04-05 DIAGNOSIS — F32A Depression, unspecified: Secondary | ICD-10-CM | POA: Diagnosis present

## 2021-04-05 DIAGNOSIS — F419 Anxiety disorder, unspecified: Secondary | ICD-10-CM | POA: Diagnosis not present

## 2021-04-05 DIAGNOSIS — Z825 Family history of asthma and other chronic lower respiratory diseases: Secondary | ICD-10-CM

## 2021-04-05 DIAGNOSIS — H9193 Unspecified hearing loss, bilateral: Secondary | ICD-10-CM | POA: Diagnosis present

## 2021-04-05 DIAGNOSIS — I639 Cerebral infarction, unspecified: Secondary | ICD-10-CM | POA: Diagnosis present

## 2021-04-05 DIAGNOSIS — R0689 Other abnormalities of breathing: Secondary | ICD-10-CM

## 2021-04-05 DIAGNOSIS — M7989 Other specified soft tissue disorders: Secondary | ICD-10-CM

## 2021-04-05 DIAGNOSIS — I428 Other cardiomyopathies: Secondary | ICD-10-CM | POA: Diagnosis not present

## 2021-04-05 DIAGNOSIS — R600 Localized edema: Secondary | ICD-10-CM | POA: Diagnosis not present

## 2021-04-05 DIAGNOSIS — T796XXA Traumatic ischemia of muscle, initial encounter: Secondary | ICD-10-CM | POA: Diagnosis present

## 2021-04-05 DIAGNOSIS — R748 Abnormal levels of other serum enzymes: Secondary | ICD-10-CM | POA: Diagnosis present

## 2021-04-05 DIAGNOSIS — Z634 Disappearance and death of family member: Secondary | ICD-10-CM

## 2021-04-05 DIAGNOSIS — I5022 Chronic systolic (congestive) heart failure: Secondary | ICD-10-CM | POA: Diagnosis present

## 2021-04-05 DIAGNOSIS — F1410 Cocaine abuse, uncomplicated: Secondary | ICD-10-CM | POA: Diagnosis present

## 2021-04-05 DIAGNOSIS — R0600 Dyspnea, unspecified: Secondary | ICD-10-CM | POA: Diagnosis not present

## 2021-04-05 DIAGNOSIS — Z8619 Personal history of other infectious and parasitic diseases: Secondary | ICD-10-CM

## 2021-04-05 DIAGNOSIS — R42 Dizziness and giddiness: Secondary | ICD-10-CM | POA: Diagnosis not present

## 2021-04-05 DIAGNOSIS — E872 Acidosis, unspecified: Secondary | ICD-10-CM | POA: Diagnosis present

## 2021-04-05 DIAGNOSIS — I110 Hypertensive heart disease with heart failure: Secondary | ICD-10-CM | POA: Diagnosis present

## 2021-04-05 DIAGNOSIS — R9401 Abnormal electroencephalogram [EEG]: Secondary | ICD-10-CM | POA: Diagnosis present

## 2021-04-05 DIAGNOSIS — T405X1A Poisoning by cocaine, accidental (unintentional), initial encounter: Principal | ICD-10-CM | POA: Diagnosis present

## 2021-04-05 DIAGNOSIS — G928 Other toxic encephalopathy: Secondary | ICD-10-CM | POA: Diagnosis present

## 2021-04-05 DIAGNOSIS — F1910 Other psychoactive substance abuse, uncomplicated: Secondary | ICD-10-CM | POA: Diagnosis not present

## 2021-04-05 DIAGNOSIS — Z20822 Contact with and (suspected) exposure to covid-19: Secondary | ICD-10-CM | POA: Diagnosis not present

## 2021-04-05 DIAGNOSIS — E860 Dehydration: Secondary | ICD-10-CM | POA: Diagnosis present

## 2021-04-05 DIAGNOSIS — Z811 Family history of alcohol abuse and dependence: Secondary | ICD-10-CM

## 2021-04-05 DIAGNOSIS — Z833 Family history of diabetes mellitus: Secondary | ICD-10-CM

## 2021-04-05 DIAGNOSIS — T50901A Poisoning by unspecified drugs, medicaments and biological substances, accidental (unintentional), initial encounter: Secondary | ICD-10-CM

## 2021-04-05 DIAGNOSIS — Z8261 Family history of arthritis: Secondary | ICD-10-CM

## 2021-04-05 DIAGNOSIS — G8191 Hemiplegia, unspecified affecting right dominant side: Secondary | ICD-10-CM | POA: Diagnosis not present

## 2021-04-05 DIAGNOSIS — K219 Gastro-esophageal reflux disease without esophagitis: Secondary | ICD-10-CM

## 2021-04-05 DIAGNOSIS — J90 Pleural effusion, not elsewhere classified: Secondary | ICD-10-CM | POA: Diagnosis not present

## 2021-04-05 DIAGNOSIS — I469 Cardiac arrest, cause unspecified: Secondary | ICD-10-CM | POA: Diagnosis not present

## 2021-04-05 DIAGNOSIS — E875 Hyperkalemia: Secondary | ICD-10-CM | POA: Diagnosis not present

## 2021-04-05 DIAGNOSIS — Z8249 Family history of ischemic heart disease and other diseases of the circulatory system: Secondary | ICD-10-CM

## 2021-04-05 DIAGNOSIS — R768 Other specified abnormal immunological findings in serum: Secondary | ICD-10-CM | POA: Diagnosis present

## 2021-04-05 DIAGNOSIS — I6389 Other cerebral infarction: Secondary | ICD-10-CM | POA: Diagnosis not present

## 2021-04-05 DIAGNOSIS — N179 Acute kidney failure, unspecified: Secondary | ICD-10-CM | POA: Diagnosis not present

## 2021-04-05 DIAGNOSIS — R29700 NIHSS score 0: Secondary | ICD-10-CM | POA: Diagnosis not present

## 2021-04-05 DIAGNOSIS — J3489 Other specified disorders of nose and nasal sinuses: Secondary | ICD-10-CM | POA: Diagnosis not present

## 2021-04-05 DIAGNOSIS — T50901D Poisoning by unspecified drugs, medicaments and biological substances, accidental (unintentional), subsequent encounter: Secondary | ICD-10-CM | POA: Diagnosis not present

## 2021-04-05 DIAGNOSIS — H918X3 Other specified hearing loss, bilateral: Secondary | ICD-10-CM

## 2021-04-05 DIAGNOSIS — R000 Tachycardia, unspecified: Secondary | ICD-10-CM | POA: Diagnosis not present

## 2021-04-05 DIAGNOSIS — R32 Unspecified urinary incontinence: Secondary | ICD-10-CM | POA: Diagnosis present

## 2021-04-05 DIAGNOSIS — T424X1A Poisoning by benzodiazepines, accidental (unintentional), initial encounter: Secondary | ICD-10-CM | POA: Diagnosis not present

## 2021-04-05 DIAGNOSIS — F17210 Nicotine dependence, cigarettes, uncomplicated: Secondary | ICD-10-CM | POA: Diagnosis present

## 2021-04-05 DIAGNOSIS — R0602 Shortness of breath: Secondary | ICD-10-CM | POA: Diagnosis not present

## 2021-04-05 DIAGNOSIS — Z8349 Family history of other endocrine, nutritional and metabolic diseases: Secondary | ICD-10-CM

## 2021-04-05 DIAGNOSIS — R4182 Altered mental status, unspecified: Secondary | ICD-10-CM | POA: Diagnosis not present

## 2021-04-05 DIAGNOSIS — K5900 Constipation, unspecified: Secondary | ICD-10-CM | POA: Diagnosis not present

## 2021-04-05 HISTORY — DX: Cerebral infarction, unspecified: I63.9

## 2021-04-05 HISTORY — DX: Other specified abnormal immunological findings in serum: R76.8

## 2021-04-05 HISTORY — DX: Poisoning by unspecified drugs, medicaments and biological substances, accidental (unintentional), initial encounter: T50.901A

## 2021-04-05 HISTORY — DX: Unspecified hearing loss, bilateral: H91.93

## 2021-04-05 LAB — CBC WITH DIFFERENTIAL/PLATELET
Abs Immature Granulocytes: 0.44 10*3/uL — ABNORMAL HIGH (ref 0.00–0.07)
Basophils Absolute: 0.1 10*3/uL (ref 0.0–0.1)
Basophils Relative: 1 %
Eosinophils Absolute: 0.1 10*3/uL (ref 0.0–0.5)
Eosinophils Relative: 0 %
HCT: 48.6 % (ref 39.0–52.0)
Hemoglobin: 15.5 g/dL (ref 13.0–17.0)
Immature Granulocytes: 2 %
Lymphocytes Relative: 7 %
Lymphs Abs: 1.8 10*3/uL (ref 0.7–4.0)
MCH: 32 pg (ref 26.0–34.0)
MCHC: 31.9 g/dL (ref 30.0–36.0)
MCV: 100.4 fL — ABNORMAL HIGH (ref 80.0–100.0)
Monocytes Absolute: 1.4 10*3/uL — ABNORMAL HIGH (ref 0.1–1.0)
Monocytes Relative: 5 %
Neutro Abs: 21.5 10*3/uL — ABNORMAL HIGH (ref 1.7–7.7)
Neutrophils Relative %: 85 %
Platelets: 285 10*3/uL (ref 150–400)
RBC: 4.84 MIL/uL (ref 4.22–5.81)
RDW: 13.4 % (ref 11.5–15.5)
WBC: 25.3 10*3/uL — ABNORMAL HIGH (ref 4.0–10.5)
nRBC: 0 % (ref 0.0–0.2)

## 2021-04-05 LAB — PROTIME-INR
INR: 1.3 — ABNORMAL HIGH (ref 0.8–1.2)
Prothrombin Time: 16.5 seconds — ABNORMAL HIGH (ref 11.4–15.2)

## 2021-04-05 LAB — URINALYSIS, ROUTINE W REFLEX MICROSCOPIC
Bacteria, UA: NONE SEEN
Bilirubin Urine: NEGATIVE
Glucose, UA: 50 mg/dL — AB
Ketones, ur: 20 mg/dL — AB
Leukocytes,Ua: NEGATIVE
Nitrite: NEGATIVE
Protein, ur: 100 mg/dL — AB
Specific Gravity, Urine: 1.014 (ref 1.005–1.030)
pH: 6 (ref 5.0–8.0)

## 2021-04-05 LAB — LACTIC ACID, PLASMA
Lactic Acid, Venous: >9 mmol/L (ref 0.5–1.9)
Lactic Acid, Venous: 7.8 mmol/L (ref 0.5–1.9)
Lactic Acid, Venous: 2 mmol/L (ref 0.5–1.9)

## 2021-04-05 LAB — RAPID URINE DRUG SCREEN, HOSP PERFORMED
Amphetamines: NOT DETECTED
Barbiturates: NOT DETECTED
Benzodiazepines: POSITIVE — AB
Cocaine: POSITIVE — AB
Opiates: NOT DETECTED
Tetrahydrocannabinol: POSITIVE — AB

## 2021-04-05 LAB — COMPREHENSIVE METABOLIC PANEL
ALT: 272 U/L — ABNORMAL HIGH (ref 0–44)
AST: 443 U/L — ABNORMAL HIGH (ref 15–41)
Albumin: 4.6 g/dL (ref 3.5–5.0)
Alkaline Phosphatase: 130 U/L — ABNORMAL HIGH (ref 38–126)
Anion gap: 24 — ABNORMAL HIGH (ref 5–15)
BUN: 8 mg/dL (ref 6–20)
CO2: 17 mmol/L — ABNORMAL LOW (ref 22–32)
Calcium: 9 mg/dL (ref 8.9–10.3)
Chloride: 95 mmol/L — ABNORMAL LOW (ref 98–111)
Creatinine, Ser: 2.17 mg/dL — ABNORMAL HIGH (ref 0.61–1.24)
GFR, Estimated: 43 mL/min — ABNORMAL LOW (ref >60)
Glucose, Bld: 76 mg/dL (ref 70–99)
Potassium: 6.2 mmol/L — ABNORMAL HIGH (ref 3.5–5.1)
Sodium: 136 mmol/L (ref 135–145)
Total Bilirubin: 1.3 mg/dL — ABNORMAL HIGH (ref 0.3–1.2)
Total Protein: 7.7 g/dL (ref 6.5–8.1)

## 2021-04-05 LAB — CK: Total CK: 2361 U/L — ABNORMAL HIGH (ref 49–397)

## 2021-04-05 LAB — BASIC METABOLIC PANEL
Anion gap: 13 (ref 5–15)
BUN: 12 mg/dL (ref 6–20)
CO2: 21 mmol/L — ABNORMAL LOW (ref 22–32)
Calcium: 7.8 mg/dL — ABNORMAL LOW (ref 8.9–10.3)
Chloride: 100 mmol/L (ref 98–111)
Creatinine, Ser: 1.7 mg/dL — ABNORMAL HIGH (ref 0.61–1.24)
GFR, Estimated: 57 mL/min — ABNORMAL LOW (ref >60)
Glucose, Bld: 207 mg/dL — ABNORMAL HIGH (ref 70–99)
Potassium: 4.9 mmol/L (ref 3.5–5.1)
Sodium: 134 mmol/L — ABNORMAL LOW (ref 135–145)

## 2021-04-05 LAB — CBG MONITORING, ED
Glucose-Capillary: 64 mg/dL — ABNORMAL LOW (ref 70–99)
Glucose-Capillary: 163 mg/dL — ABNORMAL HIGH (ref 70–99)

## 2021-04-05 LAB — ETHANOL: Alcohol, Ethyl (B): <10 mg/dL (ref <10)

## 2021-04-05 LAB — RESP PANEL BY RT-PCR (FLU A&B, COVID) ARPGX2
Influenza A by PCR: NEGATIVE
Influenza B by PCR: NEGATIVE
SARS Coronavirus 2 by RT PCR: NEGATIVE

## 2021-04-05 LAB — MRSA NEXT GEN BY PCR, NASAL: MRSA by PCR Next Gen: DETECTED — AB

## 2021-04-05 LAB — GLUCOSE, CAPILLARY: Glucose-Capillary: 133 mg/dL — ABNORMAL HIGH (ref 70–99)

## 2021-04-05 LAB — HIV ANTIBODY (ROUTINE TESTING W REFLEX): HIV Screen 4th Generation wRfx: NONREACTIVE

## 2021-04-05 LAB — APTT: aPTT: 33 seconds (ref 24–36)

## 2021-04-05 MED ORDER — SODIUM CHLORIDE 0.9 % IV BOLUS
2000.0000 mL | Freq: Once | INTRAVENOUS | Status: AC
Start: 2021-04-05 — End: 2021-04-05
  Administered 2021-04-05: 2000 mL via INTRAVENOUS

## 2021-04-05 MED ORDER — ALBUTEROL SULFATE (2.5 MG/3ML) 0.083% IN NEBU: 2.5000 mg | INHALATION_SOLUTION | Freq: Four times a day (QID) | RESPIRATORY_TRACT | Status: DC | PRN

## 2021-04-05 MED ORDER — SODIUM CHLORIDE 0.9% FLUSH
3.0000 mL | Freq: Two times a day (BID) | INTRAVENOUS | Status: DC
Start: 2021-04-05 — End: 2021-04-10
  Administered 2021-04-05 – 2021-04-07 (×4): 3 mL via INTRAVENOUS
  Administered 2021-04-07: 10 mL via INTRAVENOUS
  Administered 2021-04-09: 3 mL via INTRAVENOUS

## 2021-04-05 MED ORDER — INSULIN ASPART 100 UNIT/ML IV SOLN
5.0000 [IU] | Freq: Once | INTRAVENOUS | Status: AC
  Administered 2021-04-05: 5 [IU] via INTRAVENOUS

## 2021-04-05 MED ORDER — MUPIROCIN 2 % EX OINT
1.0000 "application " | TOPICAL_OINTMENT | Freq: Two times a day (BID) | CUTANEOUS | Status: AC
  Administered 2021-04-05 – 2021-04-10 (×10): 1 via NASAL
  Filled 2021-04-05 (×2): qty 22

## 2021-04-05 MED ORDER — ONDANSETRON HCL 4 MG PO TABS
4.0000 mg | ORAL_TABLET | Freq: Four times a day (QID) | ORAL | Status: DC | PRN
  Administered 2021-04-10 (×2): 4 mg via ORAL
  Filled 2021-04-05 (×2): qty 1

## 2021-04-05 MED ORDER — SODIUM CHLORIDE 0.9 % IV BOLUS
1000.0000 mL | Freq: Once | INTRAVENOUS | Status: AC
  Administered 2021-04-05: 1000 mL via INTRAVENOUS

## 2021-04-05 MED ORDER — BISACODYL 10 MG RE SUPP: 10.0000 mg | Freq: Every day | RECTAL | Status: DC | PRN

## 2021-04-05 MED ORDER — PANTOPRAZOLE SODIUM 40 MG IV SOLR
40.0000 mg | Freq: Every day | INTRAVENOUS | Status: DC
  Administered 2021-04-05 – 2021-04-10 (×6): 40 mg via INTRAVENOUS
  Filled 2021-04-05 (×6): qty 10

## 2021-04-05 MED ORDER — ONDANSETRON HCL 4 MG/2ML IJ SOLN: 4.0000 mg | Freq: Four times a day (QID) | INTRAMUSCULAR | Status: DC | PRN

## 2021-04-05 MED ORDER — SODIUM CHLORIDE 0.9 % IV SOLN
2.0000 g | Freq: Two times a day (BID) | INTRAVENOUS | Status: DC
  Administered 2021-04-06 – 2021-04-08 (×5): 2 g via INTRAVENOUS
  Filled 2021-04-05 (×5): qty 2

## 2021-04-05 MED ORDER — LORAZEPAM 2 MG/ML IJ SOLN
INTRAMUSCULAR | Status: AC
  Filled 2021-04-05: qty 1

## 2021-04-05 MED ORDER — METRONIDAZOLE 500 MG/100ML IV SOLN
500.0000 mg | Freq: Once | INTRAVENOUS | Status: AC
  Administered 2021-04-05: 500 mg via INTRAVENOUS
  Filled 2021-04-05: qty 100

## 2021-04-05 MED ORDER — VANCOMYCIN HCL IN DEXTROSE 1-5 GM/200ML-% IV SOLN: 1000.0000 mg | Freq: Once | INTRAVENOUS | Status: DC

## 2021-04-05 MED ORDER — VANCOMYCIN HCL 750 MG/150ML IV SOLN
750.0000 mg | Freq: Two times a day (BID) | INTRAVENOUS | Status: DC
  Administered 2021-04-06: 750 mg via INTRAVENOUS
  Filled 2021-04-05 (×3): qty 150

## 2021-04-05 MED ORDER — DEXTROSE-NACL 5-0.45 % IV SOLN: INTRAVENOUS | Status: DC

## 2021-04-05 MED ORDER — SODIUM BICARBONATE 8.4 % IV SOLN: 50.0000 meq | Freq: Once | INTRAVENOUS | Status: AC

## 2021-04-05 MED ORDER — POLYETHYLENE GLYCOL 3350 17 G PO PACK: 17.0000 g | PACK | Freq: Every day | ORAL | Status: DC | PRN

## 2021-04-05 MED ORDER — CALCIUM GLUCONATE-NACL 1-0.675 GM/50ML-% IV SOLN
1.0000 g | Freq: Once | INTRAVENOUS | Status: AC
  Administered 2021-04-05: 1000 mg via INTRAVENOUS
  Filled 2021-04-05: qty 50

## 2021-04-05 MED ORDER — SODIUM ZIRCONIUM CYCLOSILICATE 10 G PO PACK
10.0000 g | PACK | Freq: Three times a day (TID) | ORAL | Status: AC
  Administered 2021-04-05 – 2021-04-06 (×2): 10 g via ORAL
  Filled 2021-04-05 (×2): qty 1

## 2021-04-05 MED ORDER — ACETAMINOPHEN 650 MG RE SUPP: 650.0000 mg | Freq: Four times a day (QID) | RECTAL | Status: DC | PRN

## 2021-04-05 MED ORDER — HEPARIN SODIUM (PORCINE) 5000 UNIT/ML IJ SOLN
5000.0000 [IU] | Freq: Three times a day (TID) | INTRAMUSCULAR | Status: DC
  Administered 2021-04-05 – 2021-04-06 (×3): 5000 [IU] via SUBCUTANEOUS
  Filled 2021-04-05 (×4): qty 1

## 2021-04-05 MED ORDER — SODIUM CHLORIDE 0.9 % IV SOLN: 250.0000 mL | INTRAVENOUS | Status: DC | PRN

## 2021-04-05 MED ORDER — CHLORHEXIDINE GLUCONATE CLOTH 2 % EX PADS
6.0000 | MEDICATED_PAD | Freq: Every day | CUTANEOUS | Status: DC
  Administered 2021-04-06 – 2021-04-10 (×5): 6 via TOPICAL

## 2021-04-05 MED ORDER — IPRATROPIUM-ALBUTEROL 0.5-2.5 (3) MG/3ML IN SOLN
3.0000 mL | Freq: Four times a day (QID) | RESPIRATORY_TRACT | Status: DC
  Administered 2021-04-05: 3 mL via RESPIRATORY_TRACT
  Filled 2021-04-05: qty 3

## 2021-04-05 MED ORDER — SODIUM BICARBONATE 8.4 % IV SOLN
INTRAVENOUS | Status: AC
  Administered 2021-04-05: 50 meq via INTRAVENOUS
  Filled 2021-04-05: qty 50

## 2021-04-05 MED ORDER — SODIUM CHLORIDE 0.9% FLUSH: 3.0000 mL | INTRAVENOUS | Status: DC | PRN

## 2021-04-05 MED ORDER — KCL IN DEXTROSE-NACL 40-5-0.45 MEQ/L-%-% IV SOLN: INTRAVENOUS | Status: DC

## 2021-04-05 MED ORDER — DEXTROSE 50 % IV SOLN
1.0000 | Freq: Once | INTRAVENOUS | Status: AC
  Filled 2021-04-05: qty 50

## 2021-04-05 MED ORDER — LORAZEPAM 2 MG/ML IJ SOLN: 2.0000 mg | Freq: Once | INTRAMUSCULAR | Status: AC

## 2021-04-05 MED ORDER — DM-GUAIFENESIN ER 30-600 MG PO TB12
1.0000 | ORAL_TABLET | Freq: Two times a day (BID) | ORAL | Status: DC
  Administered 2021-04-05 – 2021-04-10 (×10): 1 via ORAL
  Filled 2021-04-05 (×14): qty 1

## 2021-04-05 MED ORDER — PROCHLORPERAZINE 25 MG RE SUPP: 25.0000 mg | Freq: Once | RECTAL | Status: DC

## 2021-04-05 MED ORDER — ASPIRIN 325 MG PO TABS
325.0000 mg | ORAL_TABLET | Freq: Every day | ORAL | Status: DC
  Administered 2021-04-05 – 2021-04-06 (×2): 325 mg via ORAL
  Filled 2021-04-05 (×2): qty 1

## 2021-04-05 MED ORDER — SODIUM CHLORIDE 0.9% FLUSH
3.0000 mL | Freq: Two times a day (BID) | INTRAVENOUS | Status: DC
  Administered 2021-04-05 – 2021-04-07 (×4): 3 mL via INTRAVENOUS
  Administered 2021-04-08: 10 mL via INTRAVENOUS
  Administered 2021-04-09 – 2021-04-10 (×2): 3 mL via INTRAVENOUS

## 2021-04-05 MED ORDER — ACETAMINOPHEN 325 MG PO TABS
650.0000 mg | ORAL_TABLET | Freq: Four times a day (QID) | ORAL | Status: DC | PRN
  Filled 2021-04-05: qty 2

## 2021-04-05 MED ORDER — GADOBUTROL 1 MMOL/ML IV SOLN
8.0000 mL | Freq: Once | INTRAVENOUS | Status: AC | PRN
  Administered 2021-04-05: 8 mL via INTRAVENOUS

## 2021-04-05 MED ORDER — ADULT MULTIVITAMIN W/MINERALS CH
1.0000 | ORAL_TABLET | Freq: Every day | ORAL | Status: DC
  Administered 2021-04-05 – 2021-04-10 (×6): 1 via ORAL
  Filled 2021-04-05 (×6): qty 1

## 2021-04-05 MED ORDER — SODIUM CHLORIDE 0.9 % IV SOLN
2.0000 g | Freq: Once | INTRAVENOUS | Status: AC
  Administered 2021-04-05: 2 g via INTRAVENOUS
  Filled 2021-04-05: qty 2

## 2021-04-05 MED ORDER — VANCOMYCIN HCL 1750 MG/350ML IV SOLN
1750.0000 mg | Freq: Once | INTRAVENOUS | Status: AC
  Administered 2021-04-05: 1750 mg via INTRAVENOUS
  Filled 2021-04-05: qty 350

## 2021-04-05 MED ORDER — ALBUTEROL SULFATE (2.5 MG/3ML) 0.083% IN NEBU: 2.5000 mg | INHALATION_SOLUTION | RESPIRATORY_TRACT | Status: DC | PRN

## 2021-04-05 MED ORDER — DEXTROSE 50 % IV SOLN
INTRAVENOUS | Status: AC
  Administered 2021-04-05: 50 mL via INTRAVENOUS
  Filled 2021-04-05: qty 50

## 2021-04-05 NOTE — Consult Note (Signed)
NAME:  Corey Rivera, MRN:  [id number], DOB:  Dec 21, 1995, LOS: 0 ADMISSION DATE:  04/05/2021, CONSULTATION DATE:  04/05/2021  REFERRING MD:  [email or web address], EDP, CHIEF COMPLAINT: Overdose, metabolic abnormalities  History of Present Illness:  26 year old with a history of heroin abuse and previous overdose BIBEMS after being found unresponsive, not breathing at his girlfriend's house.  Bystander performed CPR, poured ice water on him and 4 mg Narcan given in intranasal.  EMS gave 2 mg Narcan IV with eye-opening response, placed on 15 L for transport with saturation 90%. In ED, was placed on room air , labs showed leukocytosis, lactate more than 9, potassium 6.2 with BUN/creatinine 8/2.2 and elevated LFTs.  UDS positive for cocaine, THC and benzodiazepines Treated for sepsis with IV cefepime and given 2 L of IV fluids.  He was following commands but could not hear, head CT was negative.  PCCM consulted He was admitted to [facility] in December for bizarre behavior, delusions stating that "world is ending", attributed to delirium from withdrawal, MRI was negative.  He subsequently enrolled in a drug rehab program and was discharged to his grandmother's house.  Further history is also obtained from grandmother His brother died of a for drug overdose 1 year ago, mother died in a car accident several years ago  Pertinent  Medical History  Hepatitis C Polysubstance abuse-opiates, fentanyl, amphetamine History of seizure due to fentanyl more than 10 years ago  Significant Hospital Events: Including procedures, antibiotic start and stop dates in addition to other pertinent events     Interim History / Subjective:  Sitting up in ED stretcher, no distress Incontinent of urine and feces x1  Objective   Blood pressure (!) 83/65, pulse (!) 141, temperature 100.2 F (37.9 C), resp. rate 17, height $RemoveBe'5\' 9"'fBUiymPgu$  (1.753 m), weight 83.6 kg, SpO2 98 %.        Intake/Output Summary (Last 24 hours) at 04/05/2021 1315 Last  data filed at 04/05/2021 1104 Gross per 24 hour  Intake 1000 ml  Output --  Net 1000 ml   Filed Weights   04/05/21 0959  Weight: 83.6 kg    Examination: General: Well-developed, well-nourished man, no distress HENT: Hard of hearing but is able to understand when spoken loudly and is able to read lips, no pallor or icterus, no JVD, pupils 3 mm bilaterally equally reactive to light, no nystagmus Lungs: Clear breath sounds bilateral, no accessory muscle use Cardiovascular: S1-S2 tacky Abdomen: Soft, nontender, no organomegaly Extremities: No deformity, no edema Neuro: Alert, oriented x3, grossly nonfocal, no neck stiffness   Resolved Hospital Problem list     Assessment & Plan:  Drug overdose -cocaine, THC and benzodiazepines.  He admits to using heroin, surprisingly UDS did not show opiates, he did have some response to Narcan however. Currently does not appear to be delirious but does have acute onset deafness, unclear reason, we will have to see how this evolves -With lactic acidosis, elevated CK and episode of incontinence, have to consider possibility of seizure and should obtain EEG x1 -Repeat lactate x1, being presumptively treated for sepsis with antibiotics  AKI with hyperkalemia -Likely related to cocaine use -IV fluids and follow serial be met -No significant EKG changes, so can hold off on treating hyperkalemia and wait for acidosis to resolve -CK mildly elevated consistent with mild rhabdo  Elevated LFTs, history of hepatitis C -Follow   Best Practice (right click and "Reselect all SmartList Selections" daily)   Diet/type: Regular consistency (see orders) DVT prophylaxis:  SCD GI prophylaxis: N/A Lines: N/A Foley:  N/A Code Status:  full code Last date of multidisciplinary goals of care discussion [NA]  Labs   CBC: Recent Labs  Lab 04/05/21 1112  WBC 25.3*  NEUTROABS 21.5*  HGB 15.5  HCT 48.6  MCV 100.4*  PLT 480    Basic Metabolic Panel: Recent  Labs  Lab 04/05/21 1112  NA 136  K 6.2*  CL 95*  CO2 17*  GLUCOSE 76  BUN 8  CREATININE 2.17*  CALCIUM 9.0   GFR: Estimated Creatinine Clearance: 52.9 mL/min (A) (by C-G formula based on SCr of 2.17 mg/dL (H)). Recent Labs  Lab 04/05/21 1112  WBC 25.3*  LATICACIDVEN >9.0*    Liver Function Tests: Recent Labs  Lab 04/05/21 1112  AST 443*  ALT 272*  ALKPHOS 130*  BILITOT 1.3*  PROT 7.7  ALBUMIN 4.6   No results for input(s): LIPASE, AMYLASE in the last 168 hours. No results for input(s): AMMONIA in the last 168 hours.  ABG No results found for: PHART, PCO2ART, PO2ART, HCO3, TCO2, ACIDBASEDEF, O2SAT   Coagulation Profile: Recent Labs  Lab 04/05/21 1112  INR 1.3*    Cardiac Enzymes: No results for input(s): CKTOTAL, CKMB, CKMBINDEX, TROPONINI in the last 168 hours.  HbA1C: No results found for: HGBA1C  CBG: Recent Labs  Lab 04/05/21 0957 04/05/21 1102  GLUCAP 64* 163*    Review of Systems:   Acute onset deafness, hard of hearing No visual problems No headache, nausea, vomiting Incontinence of bowel and bladder x1   Past Medical History:  He,  has a past medical history of Allergy, Depression, Drug addiction ([place]), GERD (gastroesophageal reflux disease), and History of chickenpox.   Surgical History:   Past Surgical History:  Procedure Laterality Date   NO PAST SURGERIES       Social History:   reports that he has been smoking cigarettes. He has never used smokeless tobacco. He reports current alcohol use. He reports current drug use. Drugs: Cocaine and Marijuana.   Family History:  His family history includes Alcohol abuse in his brother, father, maternal grandfather, and paternal grandfather; Arthritis in his maternal grandmother; Asthma in his brother and maternal grandmother; COPD in his maternal grandmother; Diabetes in his brother; Heart attack in his paternal grandfather; Hyperlipidemia in his paternal grandfather.   Allergies No  Known Allergies   Home Medications  Prior to Admission medications   Medication Sig Start Date End Date Taking? Authorizing Provider  busPIRone (BUSPAR) 5 MG tablet Take 1 tablet (5 mg total) by mouth 2 (two) times daily. Patient not taking: Reported on 04/05/2021 03/17/17   Brunetta Jeans, PA-C  escitalopram (LEXAPRO) 20 MG tablet Take 1 tablet (20 mg total) by mouth daily. Patient not taking: Reported on 04/05/2021 02/25/17   Brunetta Jeans, PA-C  pantoprazole (PROTONIX) 40 MG tablet Take 1 tablet (40 mg total) by mouth daily. Patient not taking: Reported on 04/05/2021 01/23/17   Brunetta Jeans, PA-C     Critical care time: [place] MD. [facility] Pager : [phone]  If no response to pager , please call [phone] until 7 pm After 7:00 pm call Elink  [phone]   04/05/2021

## 2021-04-05 NOTE — Progress Notes (Signed)
EEG completed, results pending. 

## 2021-04-05 NOTE — ED Notes (Signed)
CBG 64

## 2021-04-05 NOTE — Progress Notes (Signed)
Pharmacy Antibiotic Note  Corey Rivera is a 26 y.o. male admitted on 04/05/2021 with  unknown source of infection .  Pharmacy has been consulted for vancomycin and cefepime dosing.  Plan: Vancomycin 1750 mg IV x 1 dose. Vancomycin 750 mg IV every 12 hours. Cefepime 2000 mg IV every 12 hours. Monitor labs, c/s, and vanco level as indicated.  Height: 5\' 9"  (175.3 cm) Weight: 83.6 kg (184 lb 4.8 oz) IBW/kg (Calculated) : 70.7  Temp (24hrs), Avg:98.6 F (37 C), Min:97.5 F (36.4 C), Max:99.7 F (37.6 C)  Recent Labs  Lab 04/05/21 1112  WBC 25.3*  CREATININE 2.17*  LATICACIDVEN >9.0*    Estimated Creatinine Clearance: 52.9 mL/min (A) (by C-G formula based on SCr of 2.17 mg/dL (H)).    No Known Allergies  Antimicrobials this admission: Vanco 2/10 >> Cefepime 2/10 >> Flagyl 2/10  Microbiology results: 2/10 BCx: pending 2/10 UCx: pending   Thank you for allowing pharmacy to be a part of this patients care.  4/10, PharmD Clinical Pharmacist 04/05/2021 12:15 PM

## 2021-04-05 NOTE — Procedures (Signed)
[facility]  Routine EEG Report  Patient Name:   Corey Rivera, Corey Rivera Date of Birth:   07/05/1995 Identification Number:   MRN - [id number]  Date of Study:   04/05/2021 17:41:27 Duration: 23 minutes  Indication: Encephalopathy  Technical Summary: A routine 20 channel electroencephalogram using the international 10-20 system of electrode placement was performed.  Background: 5-6 Hz, Poorly formed  States: Drowsy and Asleep - Vertex Waves, K Complexes were seen during asleep  Abnormalities: - Generalized Slowing: Diffuse generalized slowing - Background Slowing: The background consists of 20-50 uV, 5-6 Hz diffuse activity with superimposed diffuse polymorphic delta activity that is non reactive to external stimulation.  - When most awake this reaches 7-8 Hz.   Activation Procedures: - Hyperventilation and Photic Stimulation were not performed  Classification: Abnormal   Clinical Correlation: - This EEG is abnormal due to mild generalized slowing. -  This is a technically limited study, throughout the recording he is asleep.  - Generalized slowing is a nonspecific finding but is consistent with a generalized disturbance of cerebral function and may be seen in toxic, metabolic, post anoxic, multifocal or diffuse structural abnormalities. - No electrographic seizures were recorded. - Clinical correlation is recommended.      Dr Sampson Goon   [facility] [phone]  Case [id number]

## 2021-04-05 NOTE — ED Provider Notes (Signed)
[facility] EMERGENCY DEPARTMENT Provider Note   CSN: [id number] Arrival date & time: 04/05/21  [id number]     History  Chief Complaint  Patient presents with   Drug Overdose    Corey Rivera is a 26 y.o. male.  Patient was found down by family.  He was having poor respirations.  He was given for Narcan by family and then paramedics arrived and given another 2 of Narcan.  Patient was still confused but breathing better.  Patient has a history of substance abuse  The history is provided by a relative. No language interpreter was used.  Drug Overdose This is a new problem. The current episode started 12 to 24 hours ago. The problem occurs constantly. Pertinent negatives include no chest pain. Nothing aggravates the symptoms. Nothing relieves the symptoms. He has tried nothing for the symptoms. The treatment provided no relief.      Home Medications Prior to Admission medications   Medication Sig Start Date End Date Taking? Authorizing Provider  busPIRone (BUSPAR) 5 MG tablet Take 1 tablet (5 mg total) by mouth 2 (two) times daily. Patient not taking: Reported on 04/05/2021 03/17/17   Brunetta Jeans, PA-C  escitalopram (LEXAPRO) 20 MG tablet Take 1 tablet (20 mg total) by mouth daily. Patient not taking: Reported on 04/05/2021 02/25/17   Brunetta Jeans, PA-C  pantoprazole (PROTONIX) 40 MG tablet Take 1 tablet (40 mg total) by mouth daily. Patient not taking: Reported on 04/05/2021 01/23/17   Brunetta Jeans, PA-C      Allergies    Patient has no known allergies.    Review of Systems   Review of Systems  Unable to perform ROS: Mental status change  Cardiovascular:  Negative for chest pain.   Physical Exam Updated Vital Signs BP 94/73    Pulse (!) 135    Temp 100 F (37.8 C)    Resp (!) 22    Ht 5\' 9"  (1.753 m)    Wt 83.6 kg    SpO2 99%    BMI 27.22 kg/m  Physical Exam Constitutional:      Appearance: He is well-developed.     Comments: Patient only responding to painful  stimuli but is awake  HENT:     Head: Normocephalic.     Nose: Nose normal.  Eyes:     General: No scleral icterus.    Conjunctiva/sclera: Conjunctivae normal.  Neck:     Thyroid: No thyromegaly.  Cardiovascular:     Rate and Rhythm: Normal rate and regular rhythm.     Heart sounds: No murmur heard.   No friction rub. No gallop.  Pulmonary:     Breath sounds: No stridor. No wheezing or rales.  Chest:     Chest wall: No tenderness.  Abdominal:     General: There is no distension.     Tenderness: There is no abdominal tenderness. There is no rebound.  Musculoskeletal:        General: Normal range of motion.     Cervical back: Neck supple.  Lymphadenopathy:     Cervical: No cervical adenopathy.  Skin:    Findings: No erythema or rash.  Neurological:     Motor: No abnormal muscle tone.     Coordination: Coordination normal.     Comments: Awake and not answering question    ED Results / Procedures / Treatments   Labs (all labs ordered are listed, but only abnormal results are displayed) Labs Reviewed  LACTIC ACID, PLASMA -  Abnormal; Notable for the following components:      Result Value   Lactic Acid, Venous >9.0 (*)    All other components within normal limits  COMPREHENSIVE METABOLIC PANEL - Abnormal; Notable for the following components:   Potassium 6.2 (*)    Chloride 95 (*)    CO2 17 (*)    Creatinine, Ser 2.17 (*)    AST 443 (*)    ALT 272 (*)    Alkaline Phosphatase 130 (*)    Total Bilirubin 1.3 (*)    GFR, Estimated 43 (*)    Anion gap 24 (*)    All other components within normal limits  CBC WITH DIFFERENTIAL/PLATELET - Abnormal; Notable for the following components:   WBC 25.3 (*)    MCV 100.4 (*)    Neutro Abs 21.5 (*)    Monocytes Absolute 1.4 (*)    Abs Immature Granulocytes 0.44 (*)    All other components within normal limits  PROTIME-INR - Abnormal; Notable for the following components:   Prothrombin Time 16.5 (*)    INR 1.3 (*)    All other  components within normal limits  URINALYSIS, ROUTINE W REFLEX MICROSCOPIC - Abnormal; Notable for the following components:   APPearance HAZY (*)    Glucose, UA 50 (*)    Hgb urine dipstick LARGE (*)    Ketones, ur 20 (*)    Protein, ur 100 (*)    All other components within normal limits  RAPID URINE DRUG SCREEN, HOSP PERFORMED - Abnormal; Notable for the following components:   Cocaine POSITIVE (*)    Benzodiazepines POSITIVE (*)    Tetrahydrocannabinol POSITIVE (*)    All other components within normal limits  CK - Abnormal; Notable for the following components:   Total CK 2,361 (*)    All other components within normal limits  CBG MONITORING, ED - Abnormal; Notable for the following components:   Glucose-Capillary 64 (*)    All other components within normal limits  CBG MONITORING, ED - Abnormal; Notable for the following components:   Glucose-Capillary 163 (*)    All other components within normal limits  CULTURE, BLOOD (ROUTINE X 2)  CULTURE, BLOOD (ROUTINE X 2)  URINE CULTURE  RESP PANEL BY RT-PCR (FLU A&B, COVID) ARPGX2  APTT  ETHANOL  LACTIC ACID, PLASMA  BASIC METABOLIC PANEL  HIV ANTIBODY (ROUTINE TESTING W REFLEX)    EKG EKG Interpretation  Date/Time:  Friday April 05 2021 09:59:52 EST Ventricular Rate:  136 PR Interval:  125 QRS Duration: 108 QT Interval:  301 QTC Calculation: 453 R Axis:   84 Text Interpretation: Sinus tachycardia Anteroseptal infarct, age indeterminate Confirmed by Milton Ferguson [id number]) on 04/05/2021 11:49:41 AM  Radiology CT Head Wo Contrast  Result Date: 04/05/2021 CLINICAL DATA:  26 year old male with history of persistent and recurrent dizziness. EXAM: CT HEAD WITHOUT CONTRAST TECHNIQUE: Contiguous axial images were obtained from the base of the skull through the vertex without intravenous contrast. RADIATION DOSE REDUCTION: This exam was performed according to the departmental dose-optimization program which includes automated  exposure control, adjustment of the mA and/or kV according to patient size and/or use of iterative reconstruction technique. COMPARISON:  Head CT 01/25/2021. FINDINGS: Brain: No evidence of acute infarction, hemorrhage, hydrocephalus, extra-axial collection or mass lesion/mass effect. Vascular: No hyperdense vessel or unexpected calcification. Skull: Normal. Negative for fracture or focal lesion. Sinuses/Orbits: No acute finding. Other: None. IMPRESSION: No acute intracranial abnormalities. The appearance of the brain is normal. Electronically Signed  By: Vinnie Langton M.D.   On: 04/05/2021 10:41   DG Chest Port 1 View  Result Date: 04/05/2021 CLINICAL DATA:  Cardiopulmonary arrest. Possible drug overdose. Questionable sepsis-evaluate for abnormality. EXAM: PORTABLE CHEST 1 VIEW COMPARISON:  Radiographs 03/25/2017 and 03/23/2017. FINDINGS: 1005 hours. The heart size and mediastinal contours are normal. The lungs are clear. There is no pleural effusion or pneumothorax. No acute osseous findings are identified. Telemetry leads overlie the chest. IMPRESSION: No evidence of active cardiopulmonary process. Electronically Signed   By: Richardean Sale M.D.   On: 04/05/2021 10:26    Procedures Procedures    Medications Ordered in ED Medications  vancomycin (VANCOREADY) IVPB 1750 mg/350 mL (has no administration in time range)  ceFEPIme (MAXIPIME) 2 g in sodium chloride 0.9 % 100 mL IVPB (has no administration in time range)  vancomycin (VANCOREADY) IVPB 750 mg/150 mL (has no administration in time range)  pantoprazole (PROTONIX) injection 40 mg (has no administration in time range)  sodium chloride flush (NS) 0.9 % injection 3 mL (has no administration in time range)  sodium chloride flush (NS) 0.9 % injection 3 mL (has no administration in time range)  sodium chloride flush (NS) 0.9 % injection 3 mL (has no administration in time range)  0.9 %  sodium chloride infusion (has no administration in time  range)  acetaminophen (TYLENOL) tablet 650 mg (has no administration in time range)    Or  acetaminophen (TYLENOL) suppository 650 mg (has no administration in time range)  polyethylene glycol (MIRALAX / GLYCOLAX) packet 17 g (has no administration in time range)  bisacodyl (DULCOLAX) suppository 10 mg (has no administration in time range)  ondansetron (ZOFRAN) tablet 4 mg (has no administration in time range)    Or  ondansetron (ZOFRAN) injection 4 mg (has no administration in time range)  heparin injection 5,000 Units (has no administration in time range)  dextrose 5 % and 0.45 % NaCl with KCl 40 mEq/L infusion (has no administration in time range)  multivitamin with minerals tablet 1 tablet (has no administration in time range)  sodium chloride 0.9 % bolus 1,000 mL (0 mLs Intravenous Stopped 04/05/21 1104)  sodium chloride 0.9 % bolus 1,000 mL (1,000 mLs Intravenous New Bag/Given 04/05/21 1215)  ceFEPIme (MAXIPIME) 2 g in sodium chloride 0.9 % 100 mL IVPB (2 g Intravenous New Bag/Given 04/05/21 1241)  metroNIDAZOLE (FLAGYL) IVPB 500 mg (500 mg Intravenous New Bag/Given 04/05/21 1218)  insulin aspart (novoLOG) injection 5 Units (5 Units Intravenous Given 04/05/21 1210)    And  dextrose 50 % solution 50 mL (50 mLs Intravenous Given 04/05/21 1211)  sodium bicarbonate injection 50 mEq (50 mEq Intravenous Given 04/05/21 1211)    ED Course/ Medical Decision Making/ A&P  CRITICAL CARE Performed by: Milton Ferguson Total critical care time: 45 minutes Critical care time was exclusive of separately billable procedures and treating other patients. Critical care was necessary to treat or prevent imminent or life-threatening deterioration. Critical care was time spent personally by me on the following activities: development of treatment plan with patient and/or surrogate as well as nursing, discussions with consultants, evaluation of patient's response to treatment, examination of patient, obtaining  history from patient or surrogate, ordering and performing treatments and interventions, ordering and review of laboratory studies, ordering and review of radiographic studies, pulse oximetry and re-evaluation of patient's condition.  Critical care was consulted and felt like the patient could be observed at [facility].  I also spoke with neurology who also  felt like the patient could stay here and initially thought an EEG was necessary but the patient awoke and responding verbally normally though EEG was not ordered.  It was determined the patient does have a hearing loss and would not answer questions because we had mass on   This patient presents to the ED for concern of altered mental status, this involves an extensive number of treatment options, and is a complaint that carries with it a high risk of complications and morbidity.  The differential diagnosis includes sepsis, substance abuse, stroke   Co morbidities that complicate the patient evaluation  Substance abuse   Additional history obtained:  Additional history obtained from grandmother External records from outside source obtained and reviewed including hospital records   Lab Tests:  I Ordered, and personally interpreted labs.  The pertinent results include: CBC shows 25,000 white count, lactic 9, potassium 6.2 and creatinine 2.7 with elevated liver studies   Imaging Studies ordered:  I ordered imaging studies including CT head unremarkable I independently visualized and interpreted imaging which showed unremarkable I agree with the radiologist interpretation   Cardiac Monitoring:  The patient was maintained on a cardiac monitor.  I personally viewed and interpreted the cardiac monitored which showed an underlying rhythm of: Sinus tach   Medicines ordered and prescription drug management:  I ordered medication including Narcan for overdose Reevaluation of the patient after these medicines showed that the  patient improved I have reviewed the patients home medicines and have made adjustments as needed   Test Considered:  CT chest   Critical Interventions:  Neurology consulted critical care consult   Consultations Obtained:  I requested consultation with the critical care and neurology,  and discussed lab and imaging findings as well as pertinent plan - they recommend: Admission to hospitalist   Problem List / ED Course:  Altered mental status   Reevaluation:  After the interventions noted above, I reevaluated the patient and found that they have :improved   Social Determinants of Health:  Substance abuse   Dispostion:  After consideration of the diagnostic results and the patients response to treatment, I feel that the patent would benefit from admission to the hospital with neurology consult.                           Medical Decision Making Amount and/or Complexity of Data Reviewed Labs: ordered. Radiology: ordered. ECG/medicine tests: ordered.  Risk OTC drugs. Prescription drug management. Decision regarding hospitalization.   Altered mental status from substance abuse        Final Clinical Impression(s) / ED Diagnoses Final diagnoses:  Polysubstance abuse [facility])    Rx / DC Orders ED Discharge Orders     None         Milton Ferguson, MD 04/08/21 1146

## 2021-04-05 NOTE — ED Triage Notes (Signed)
EMS called out to home for pt not breathing, prior to EMS arrival, family preformed CPR, given Narcan 4mg  IN and poured ice water on patient. Upon EMS arrival, pt with spontaneous respirations, Sinus tach, CBG 100, Narcan 2mg  IV given with slight eye opening response, initial O2 sat 90%, placed on 15L per NR, CO2 32

## 2021-04-05 NOTE — Plan of Care (Signed)

## 2021-04-05 NOTE — H&P (Signed)
Patient Demographics:    Corey Rivera, is a 26 y.o. male  MRN: [id number]   DOB - [birth year]  Admit Date - 04/05/2021  Outpatient Primary MD for the patient is Pcp, No   Assessment & Plan:   Assessment and Plan: No notes have been filed under this hospital service. 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 -CT head noted without acute findings -MRA head and MRA neck pending -Give aspirin,  -Hold off on Lipitor given elevated CKs -Allow permissive hypertension, avoid hypotension -Patient with history of cocaine use--??  Some component of vasospasms -Keep patient on telemetry -Get echo -TSH, A1c, lipid profile for further risk stratification -PT, OT and speech requested  2) acute metabolic encephalopathy/drug overdose-- -likely related to acute stroke and polysubstance abuse -UDS positive for cocaine, benzodiazepine and THC -Patient states he used fentanyl as well--UDS did not show opiates -The patient responded very well to Narcan  3) social/ethics--- patient was admitted that [facility] from 01/25/22 thru 01/30/22 for drug withdrawal -Subsequently went to inpatient rehab treatment for 4 weeks, he was discharged from drug rehab in mid January  -Additional history obtained from patient and grandmother Corey Rivera who patient stays with, as well as patient's aunt [name] -Patient is a full code  4)  hypertension/leukocytosis/lactic acidosis/hypothermia--query reactive due to persistent drug abuse, acute stroke, compounded by dehydration/volume depletion --Patient met/criteria with tachypnea, tachycardia and leukocytosis -Continue cefepime and vancomycin pending further culture data -Chest x-ray without acute infection -UA is not suggestive of UTI  5)Elevated CPK/AKI/hyperkalemia----acute kidney injury  -On admission creatinine was 2.17, CPK 2361 -Patient received hyperkalemia cocktail -EKG without acute changes -Continue to hydrate aggressively  renally adjust medications, avoid nephrotoxic agents / dehydration  / hypotension  6) history of hep C--- LFTs noted, patient will need outpatient follow-up  7) prior history of seizures in the setting of drug/opiate withdrawal--- no evidence of seizures at this time, EEG pending  Disposition/Need for in-Hospital Stay- patient unable to be discharged at this time due to acute stroke requiring further interventions/work-up SIRS physiology requiring aggressive IV fluids and IV antibiotics pending further culture data  Status is: Inpatient  Remains inpatient appropriate because:   Dispo: The patient is from: Home              Anticipated d/c is to: Home              Anticipated d/c date is: 3 days              Patient currently is not medically stable to d/c. Barriers: Not Clinically Stable-    With History  of - Reviewed by me  Past Medical History:  Diagnosis Date   Allergy    Depression    Drug addiction ([place])    GERD (gastroesophageal reflux disease)    History of chickenpox       Past Surgical History:  Procedure Laterality Date   NO PAST SURGERIES        Chief Complaint  Patient presents with   Drug Overdose      HPI:    Corey Rivera  is a 26 y.o. 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 presents to the ED after  being found unresponsive at home with respiratory distress required 4 mg of Narcan at the scene, EMS gave additional 2 mg for total of 6 milligrams of Narcan - Patient is now more awake, patient does admit to fentanyl use -UDS positive for cocaine, THC and benzos but negative for opiates -patient was admitted that [facility] from 01/25/22 thru 01/30/22 for drug withdrawal -Subsequently went to inpatient rehab treatment for 4 weeks, he was discharged from drug rehab in mid January  -Additional history obtained from patient and grandmother Corey Rivera who patient stays with, as well as patient's aunt [name] - Patient reports hearing loss bilaterally as well as numbness and tingling over the right upper and lower extremity- -No chest pains no palpitations,  -In the ED patient is found to have an AKI with a creatinine of 2.17, hyperkalemia, as well as elevated CPK -EKG without acute findings -CT head without acute acute findings -MRI brain with subcentimeter acute stroke in cerebellum -WBC was elevated in the ED lactic acidosis noted patient received IV fluids BP improved - EDP discussed case with PCCM attending please see full consult note from PCCM attending -Please see note from Dr. Rory Percy neurology No fever  Or chills  No emesis, no chest pain Has Hearing loss    Review of systems:    In addition to the HPI above,   A full Review of  Systems was done, all other systems reviewed are negative except as noted above in HPI , .    Social History:  Reviewed by me    Social History   Tobacco Use   Smoking status: Smoker, Current Status Unknown    Packs/day: 0.00    Types: Cigarettes   Smokeless tobacco: Never  Substance Use Topics   Alcohol use: Yes     Family History :  Reviewed by me    Family History  Problem Relation Age of Onset   Alcohol abuse Father    Alcohol abuse Brother    Asthma Brother    Diabetes Brother    Arthritis Maternal  Grandmother    Asthma Maternal Grandmother    COPD Maternal Grandmother    Alcohol abuse Maternal Grandfather    Alcohol abuse Paternal Grandfather    Heart attack Paternal Grandfather    Hyperlipidemia Paternal Grandfather      Home Medications:   Prior to Admission medications   Medication Sig Start Date End Date Taking? Authorizing Provider  busPIRone (BUSPAR) 5 MG tablet Take 1 tablet (5 mg total) by mouth 2 (two) times daily. Patient not taking: Reported on 04/05/2021 03/17/17   Brunetta Jeans, PA-C  escitalopram (LEXAPRO) 20 MG tablet Take 1 tablet (20 mg total) by mouth daily. Patient not taking: Reported on 04/05/2021 02/25/17   Brunetta Jeans, PA-C  pantoprazole (PROTONIX) 40 MG  tablet Take 1 tablet (40 mg total) by mouth daily. Patient not taking: Reported on 04/05/2021 01/23/17   Brunetta Jeans, PA-C     Allergies:    No Known Allergies   Physical Exam:   Vitals  Blood pressure (!) 94/56, pulse (!) 108, temperature 99.1 F (37.3 C), temperature source Axillary, resp. rate 16, height 5' 9.02" (1.753 m), weight 83.6 kg, SpO2 98 %.  Physical Examination: General appearance -somewhat sleepy,  Mental status -becoming more awake, falls asleep easily Eyes - sclera anicteric Ears-hearing loss, TMI Neck - supple, no JVD elevation , Chest - clear  to auscultation bilaterally, symmetrical air movement,  Heart - S1 and S2 normal, regular  Abdomen - soft, nontender, nondistended, no masses or organomegaly Neurological -   neck supple without rigidity, cranial nerves II through XII intact, DTR's normal and symmetric Extremities - no pedal edema noted, intact peripheral pulses  Skin - warm, dry     Data Review:    CBC Recent Labs  Lab 04/05/21 1112  WBC 25.3*  HGB 15.5  HCT 48.6  PLT 285  MCV 100.4*  MCH 32.0  MCHC 31.9  RDW 13.4  LYMPHSABS 1.8  MONOABS 1.4*  EOSABS 0.1  BASOSABS 0.1    ------------------------------------------------------------------------------------------------------------------  Chemistries  Recent Labs  Lab 04/05/21 1112 04/05/21 1423  NA 136 134*  K 6.2* 4.9  CL 95* 100  CO2 17* 21*  GLUCOSE 76 207*  BUN 8 12  CREATININE 2.17* 1.70*  CALCIUM 9.0 7.8*  AST 443*  --   ALT 272*  --   ALKPHOS 130*  --   BILITOT 1.3*  --    ------------------------------------------------------------------------------------------------------------------ estimated creatinine clearance is 67.6 mL/min (A) (by C-G formula based on SCr of 1.7 mg/dL (H)). ------------------------------------------------------------------------------------------------------------------ No results for input(s): TSH, T4TOTAL, T3FREE, THYROIDAB in the last 72 hours.  Invalid input(s): FREET3   Coagulation profile Recent Labs  Lab 04/05/21 1112  INR 1.3*   ------------------------------------------------------------------------------------------------------------------- No results for input(s): DDIMER in the last 72 hours. -------------------------------------------------------------------------------------------------------------------  Cardiac Enzymes No results for input(s): CKMB, TROPONINI, MYOGLOBIN in the last 168 hours.  Invalid input(s): CK ------------------------------------------------------------------------------------------------------------------ No results found for: BNP   ---------------------------------------------------------------------------------------------------------------  Urinalysis    Component Value Date/Time   COLORURINE YELLOW 04/05/2021 0959   APPEARANCEUR HAZY (A) 04/05/2021 0959   LABSPEC 1.014 04/05/2021 0959   PHURINE 6.0 04/05/2021 0959   GLUCOSEU 50 (A) 04/05/2021 0959   HGBUR LARGE (A) 04/05/2021 0959   BILIRUBINUR NEGATIVE 04/05/2021 0959   KETONESUR 20 (A) 04/05/2021 0959   PROTEINUR 100 (A) 04/05/2021 0959   NITRITE  NEGATIVE 04/05/2021 0959   LEUKOCYTESUR NEGATIVE 04/05/2021 0959    ----------------------------------------------------------------------------------------------------------------   Imaging Results:    CT Head Wo Contrast  Result Date: 04/05/2021 CLINICAL DATA:  26 year old male with history of persistent and recurrent dizziness. EXAM: CT HEAD WITHOUT CONTRAST TECHNIQUE: Contiguous axial images were obtained from the base of the skull through the vertex without intravenous contrast. RADIATION DOSE REDUCTION: This exam was performed according to the departmental dose-optimization program which includes automated exposure control, adjustment of the mA and/or kV according to patient size and/or use of iterative reconstruction technique. COMPARISON:  Head CT 01/25/2021. FINDINGS: Brain: No evidence of acute infarction, hemorrhage, hydrocephalus, extra-axial collection or mass lesion/mass effect. Vascular: No hyperdense vessel or unexpected calcification. Skull: Normal. Negative for fracture or focal lesion. Sinuses/Orbits: No acute finding. Other: None. IMPRESSION: No acute intracranial abnormalities. The appearance of the brain is normal. Electronically Signed  By: Vinnie Langton M.D.   On: 04/05/2021 10:41   MR BRAIN WO CONTRAST  Result Date: 04/05/2021 CLINICAL DATA:  Provided history: Mental status change, alcohol/drug use. Additional history provided: Patient found unresponsive today. EXAM: MRI HEAD WITHOUT CONTRAST TECHNIQUE: Multiplanar, multiecho pulse sequences of the brain and surrounding structures were obtained without intravenous contrast. COMPARISON:  Head CT 04/05/2021.  Brain MRI 01/26/2021. FINDINGS: Brain: Cerebral volume is normal. Subcentimeter acute infarct within the mid-to-inferior left cerebellar hemisphere (series 5, image 8). No cortical encephalomalacia is identified. No significant cerebral white matter disease. No evidence of an intracranial mass. No chronic intracranial  blood products. No extra-axial fluid collection. No midline shift. Mega cisterna magna. Vascular: Maintained flow voids within the proximal large arterial vessels. Skull and upper cervical spine: No focal suspicious marrow lesion. Sinuses/Orbits: Visualized orbits show no acute finding. Mild mucosal thickening within the bilateral ethmoid and sphenoid sinuses. Mild-to-moderate mucosal thickening within the bilateral maxillary sinuses. IMPRESSION: Subcentimeter acute infarct within the mid-to-inferior left cerebellar hemisphere. Otherwise unremarkable non-contrast MRI appearance of the brain. Paranasal sinus disease, as described. Electronically Signed   By: Kellie Simmering D.O.   On: 04/05/2021 16:11   DG Chest Port 1 View  Result Date: 04/05/2021 CLINICAL DATA:  Cardiopulmonary arrest. Possible drug overdose. Questionable sepsis-evaluate for abnormality. EXAM: PORTABLE CHEST 1 VIEW COMPARISON:  Radiographs 03/25/2017 and 03/23/2017. FINDINGS: 1005 hours. The heart size and mediastinal contours are normal. The lungs are clear. There is no pleural effusion or pneumothorax. No acute osseous findings are identified. Telemetry leads overlie the chest. IMPRESSION: No evidence of active cardiopulmonary process. Electronically Signed   By: Richardean Sale M.D.   On: 04/05/2021 10:26    Radiological Exams on Admission: CT Head Wo Contrast  Result Date: 04/05/2021 CLINICAL DATA:  26 year old male with history of persistent and recurrent dizziness. EXAM: CT HEAD WITHOUT CONTRAST TECHNIQUE: Contiguous axial images were obtained from the base of the skull through the vertex without intravenous contrast. RADIATION DOSE REDUCTION: This exam was performed according to the departmental dose-optimization program which includes automated exposure control, adjustment of the mA and/or kV according to patient size and/or use of iterative reconstruction technique. COMPARISON:  Head CT 01/25/2021. FINDINGS: Brain: No evidence of  acute infarction, hemorrhage, hydrocephalus, extra-axial collection or mass lesion/mass effect. Vascular: No hyperdense vessel or unexpected calcification. Skull: Normal. Negative for fracture or focal lesion. Sinuses/Orbits: No acute finding. Other: None. IMPRESSION: No acute intracranial abnormalities. The appearance of the brain is normal. Electronically Signed   By: Vinnie Langton M.D.   On: 04/05/2021 10:41   MR BRAIN WO CONTRAST  Result Date: 04/05/2021 CLINICAL DATA:  Provided history: Mental status change, alcohol/drug use. Additional history provided: Patient found unresponsive today. EXAM: MRI HEAD WITHOUT CONTRAST TECHNIQUE: Multiplanar, multiecho pulse sequences of the brain and surrounding structures were obtained without intravenous contrast. COMPARISON:  Head CT 04/05/2021.  Brain MRI 01/26/2021. FINDINGS: Brain: Cerebral volume is normal. Subcentimeter acute infarct within the mid-to-inferior left cerebellar hemisphere (series 5, image 8). No cortical encephalomalacia is identified. No significant cerebral white matter disease. No evidence of an intracranial mass. No chronic intracranial blood products. No extra-axial fluid collection. No midline shift. Mega cisterna magna. Vascular: Maintained flow voids within the proximal large arterial vessels. Skull and upper cervical spine: No focal suspicious marrow lesion. Sinuses/Orbits: Visualized orbits show no acute finding. Mild mucosal thickening within the bilateral ethmoid and sphenoid sinuses. Mild-to-moderate mucosal thickening within the bilateral maxillary sinuses. IMPRESSION: Subcentimeter acute infarct within the  mid-to-inferior left cerebellar hemisphere. Otherwise unremarkable non-contrast MRI appearance of the brain. Paranasal sinus disease, as described. Electronically Signed   By: Kellie Simmering D.O.   On: 04/05/2021 16:11   DG Chest Port 1 View  Result Date: 04/05/2021 CLINICAL DATA:  Cardiopulmonary arrest. Possible drug overdose.  Questionable sepsis-evaluate for abnormality. EXAM: PORTABLE CHEST 1 VIEW COMPARISON:  Radiographs 03/25/2017 and 03/23/2017. FINDINGS: 1005 hours. The heart size and mediastinal contours are normal. The lungs are clear. There is no pleural effusion or pneumothorax. No acute osseous findings are identified. Telemetry leads overlie the chest. IMPRESSION: No evidence of active cardiopulmonary process. Electronically Signed   By: Richardean Sale M.D.   On: 04/05/2021 10:26    DVT Prophylaxis -SCD/heparin AM Labs Ordered, also please review Full Orders  Family Communication: Admission, patients condition and plan of care including tests being ordered have been discussed with the patient and  who indicate understanding and agree with the plan   Code Status - Full Code  Likely DC to home   Condition   -stable  Roxan Hockey M.D on 04/05/2021 at 7:24 PM Go to [email or web address] -  for contact info  [facility] - Office  [phone]

## 2021-04-05 NOTE — ED Notes (Signed)
Patient transported to MRI 

## 2021-04-05 NOTE — Plan of Care (Signed)
ON CALL PHONE CONSULT  Call from Dr Estell Harpin @ [facility]  Discussion 25/M with past medical history of substance use, depression, brought in for evaluation of altered mental status.  Given Narcan with some improvement.  Follows commands but is not talking to the care providing team. Lactic acidosis, leukocytosis and signs of dehydration on labs.  Prior history with admission with narcotic overdose and concern for seizures.  No fever.  No neck stiffness per the ED provider. Question was if there is any benefit of EEG.  He would definitely benefit from an EEG since he has a prior admission with concern for seizures.  No seizures noted at this time.  EEG can be done at [facility] over the weekend.  If being admitted, would prefer him to be at [facility] so that the desired work-up could be completed. If the patient's mentation does not improve and the EEG is abnormal, please feel free to call the inpatient neurology consultative team at [facility].  -- Milon Dikes, MD Neurologist [facility] Pager: [phone]

## 2021-04-06 ENCOUNTER — Inpatient Hospital Stay (HOSPITAL_COMMUNITY): Payer: BC Managed Care – PPO

## 2021-04-06 DIAGNOSIS — I6389 Other cerebral infarction: Secondary | ICD-10-CM | POA: Diagnosis not present

## 2021-04-06 DIAGNOSIS — R768 Other specified abnormal immunological findings in serum: Secondary | ICD-10-CM

## 2021-04-06 LAB — CBC
HCT: 43.9 % (ref 39.0–52.0)
Hemoglobin: 15.1 g/dL (ref 13.0–17.0)
MCH: 33.4 pg (ref 26.0–34.0)
MCHC: 34.4 g/dL (ref 30.0–36.0)
MCV: 97.1 fL (ref 80.0–100.0)
Platelets: 178 10*3/uL (ref 150–400)
RBC: 4.52 MIL/uL (ref 4.22–5.81)
RDW: 14.1 % (ref 11.5–15.5)
WBC: 21.5 10*3/uL — ABNORMAL HIGH (ref 4.0–10.5)
nRBC: 0 % (ref 0.0–0.2)

## 2021-04-06 LAB — COMPREHENSIVE METABOLIC PANEL
ALT: 479 U/L — ABNORMAL HIGH (ref 0–44)
AST: 1424 U/L — ABNORMAL HIGH (ref 15–41)
Albumin: 2.9 g/dL — ABNORMAL LOW (ref 3.5–5.0)
Alkaline Phosphatase: 66 U/L (ref 38–126)
Anion gap: 7 (ref 5–15)
BUN: 19 mg/dL (ref 6–20)
CO2: 23 mmol/L (ref 22–32)
Calcium: 7.8 mg/dL — ABNORMAL LOW (ref 8.9–10.3)
Chloride: 103 mmol/L (ref 98–111)
Creatinine, Ser: 1.16 mg/dL (ref 0.61–1.24)
GFR, Estimated: >60 mL/min (ref >60)
Glucose, Bld: 132 mg/dL — ABNORMAL HIGH (ref 70–99)
Potassium: 4.5 mmol/L (ref 3.5–5.1)
Sodium: 133 mmol/L — ABNORMAL LOW (ref 135–145)
Total Bilirubin: 0.7 mg/dL (ref 0.3–1.2)
Total Protein: 4.9 g/dL — ABNORMAL LOW (ref 6.5–8.1)

## 2021-04-06 LAB — TSH: TSH: 0.354 u[IU]/mL (ref 0.350–4.500)

## 2021-04-06 LAB — ECHOCARDIOGRAM COMPLETE
Area-P 1/2: 2.16 cm2
Calc EF: 35.6 %
Height: 69.016 in
S' Lateral: 4.5 cm
Single Plane A2C EF: 37.1 %
Single Plane A4C EF: 31.3 %
Weight: 3005.31 oz

## 2021-04-06 LAB — GLUCOSE, CAPILLARY
Glucose-Capillary: 110 mg/dL — ABNORMAL HIGH (ref 70–99)
Glucose-Capillary: 126 mg/dL — ABNORMAL HIGH (ref 70–99)
Glucose-Capillary: 143 mg/dL — ABNORMAL HIGH (ref 70–99)

## 2021-04-06 LAB — LIPID PANEL
Cholesterol: 70 mg/dL (ref 0–200)
HDL: 36 mg/dL — ABNORMAL LOW (ref >40)
LDL Cholesterol: 30 mg/dL (ref 0–99)
Total CHOL/HDL Ratio: 1.9 RATIO
Triglycerides: 18 mg/dL (ref <150)
VLDL: 4 mg/dL (ref 0–40)

## 2021-04-06 LAB — CK: Total CK: 3180 U/L — ABNORMAL HIGH (ref 49–397)

## 2021-04-06 LAB — HEPARIN LEVEL (UNFRACTIONATED): Heparin Unfractionated: 0.2 IU/mL — ABNORMAL LOW (ref 0.30–0.70)

## 2021-04-06 LAB — LACTIC ACID, PLASMA: Lactic Acid, Venous: 1.3 mmol/L (ref 0.5–1.9)

## 2021-04-06 LAB — HEMOGLOBIN A1C
Hgb A1c MFr Bld: 4.6 % — ABNORMAL LOW (ref 4.8–5.6)
Mean Plasma Glucose: 85.32 mg/dL

## 2021-04-06 MED ORDER — HEPARIN (PORCINE) 25000 UT/250ML-% IV SOLN: 12.0000 [IU]/kg/h | INTRAVENOUS | Status: DC

## 2021-04-06 MED ORDER — HEPARIN (PORCINE) 25000 UT/250ML-% IV SOLN
1850.0000 [IU]/h | INTRAVENOUS | Status: DC
  Administered 2021-04-06: 1350 [IU]/h via INTRAVENOUS
  Administered 2021-04-07: 1850 [IU]/h via INTRAVENOUS
  Filled 2021-04-06 (×2): qty 250

## 2021-04-06 MED ORDER — MELATONIN 3 MG PO TABS
6.0000 mg | ORAL_TABLET | Freq: Once | ORAL | Status: AC
  Administered 2021-04-06: 6 mg via ORAL
  Filled 2021-04-06: qty 2

## 2021-04-06 NOTE — Progress Notes (Addendum)
ANTICOAGULATION CONSULT NOTE - Initial Consult  Pharmacy Consult for heparin gtt  Indication: DVT  No Known Allergies  Patient Measurements: Height: 5' 9.02" (175.3 cm) Weight: 85.2 kg (187 lb 13.3 oz) IBW/kg (Calculated) : 70.74 Heparin Dosing Weight: HEPARIN DW (KG): 83.6   Vital Signs: Temp: 97.9 F (36.6 C) (02/11 1125) Temp Source: Oral (02/11 1125) BP: 94/46 (02/11 0600) Pulse Rate: 92 (02/11 1125)  Labs: Recent Labs    04/05/21 1112 04/05/21 1423 04/06/21 0548  HGB 15.5  --  15.1  HCT 48.6  --  43.9  PLT 285  --  178  APTT 33  --   --   LABPROT 16.5*  --   --   INR 1.3*  --   --   CREATININE 2.17* 1.70* 1.16  CKTOTAL 2,361*  --  3,180*    Estimated Creatinine Clearance: 107.2 mL/min (by C-G formula based on SCr of 1.16 mg/dL).   Medical History: Past Medical History:  Diagnosis Date   Allergy    Depression    Drug addiction ([place])    GERD (gastroesophageal reflux disease)    History of chickenpox     Medications:  Medications Prior to Admission  Medication Sig Dispense Refill Last Dose   busPIRone (BUSPAR) 5 MG tablet Take 1 tablet (5 mg total) by mouth 2 (two) times daily. (Patient not taking: Reported on 04/05/2021) 60 tablet 1 Not Taking   escitalopram (LEXAPRO) 20 MG tablet Take 1 tablet (20 mg total) by mouth daily. (Patient not taking: Reported on 04/05/2021) 30 tablet 1 Not Taking   pantoprazole (PROTONIX) 40 MG tablet Take 1 tablet (40 mg total) by mouth daily. (Patient not taking: Reported on 04/05/2021) 30 tablet 3 Not Taking   Scheduled:   Chlorhexidine Gluconate Cloth  6 each Topical Q0600   dextromethorphan-guaiFENesin  1 tablet Oral BID   multivitamin with minerals  1 tablet Oral Daily   mupirocin ointment  1 application Nasal BID   pantoprazole (PROTONIX) IV  40 mg Intravenous Daily   prochlorperazine  25 mg Rectal Once   sodium chloride flush  3 mL Intravenous Q12H   sodium chloride flush  3 mL Intravenous Q12H   sodium zirconium  cyclosilicate  10 g Oral TID   Infusions:   sodium chloride     ceFEPime (MAXIPIME) IV 2 g (04/06/21 0028)   dextrose 5 % and 0.45% NaCl 150 mL/hr at 04/06/21 0025   heparin     PRN: sodium chloride, acetaminophen **OR** acetaminophen, albuterol, bisacodyl, ondansetron **OR** ondansetron (ZOFRAN) IV, polyethylene glycol, sodium chloride flush Anti-infectives (From admission, onward)    Start     Dose/Rate Route Frequency Ordered Stop   04/06/21 0200  vancomycin (VANCOREADY) IVPB 750 mg/150 mL  Status:  Discontinued        750 mg 150 mL/hr over 60 Minutes Intravenous Every 12 hours 04/05/21 1218 04/06/21 1308   04/06/21 0000  ceFEPIme (MAXIPIME) 2 g in sodium chloride 0.9 % 100 mL IVPB        2 g 200 mL/hr over 30 Minutes Intravenous Every 12 hours 04/05/21 1213     04/05/21 1215  vancomycin (VANCOREADY) IVPB 1750 mg/350 mL        1,750 mg 175 mL/hr over 120 Minutes Intravenous  Once 04/05/21 1204 04/05/21 1646   04/05/21 1200  ceFEPIme (MAXIPIME) 2 g in sodium chloride 0.9 % 100 mL IVPB        2 g 200 mL/hr over 30 Minutes Intravenous  Once 04/05/21 1155 04/05/21 1343   04/05/21 1200  metroNIDAZOLE (FLAGYL) IVPB 500 mg        500 mg 100 mL/hr over 60 Minutes Intravenous  Once 04/05/21 1155 04/05/21 1343   04/05/21 1200  vancomycin (VANCOCIN) IVPB 1000 mg/200 mL premix  Status:  Discontinued        1,000 mg 200 mL/hr over 60 Minutes Intravenous  Once 04/05/21 1155 04/05/21 1204       Assessment: Corey Rivera a 26 y.o. male requires anticoagulation with a heparin iv infusion for the indication of  DVT. Heparin gtt will be started following pharmacy protocol per pharmacy consult. Patient is not on previous oral anticoagulant that will require aPTT/HL correlation before transitioning to only HL monitoring.   Goal of Therapy:  Heparin level 0.3-0.7 units/ml Monitor platelets by anticoagulation protocol: Yes   Plan:  Give 0 per MD units bolus x 1 Start heparin infusion at 1350  units/hr Check anti-Xa level in 6 hours and daily while on heparin Continue to monitor H&H and platelets   Donna Christen Jarred Purtee 04/06/2021,1:10 PM

## 2021-04-06 NOTE — Evaluation (Signed)
Clinical/Bedside Swallow Evaluation Patient Details  Name: Corey Rivera MRN: [id number] Date of Birth: [birth year]  Today's Date: 04/06/2021 Time: SLP Start Time (ACUTE ONLY): 1200 SLP Stop Time (ACUTE ONLY): 1226 SLP Time Calculation (min) (ACUTE ONLY): 26 min  Past Medical History:  Past Medical History:  Diagnosis Date   Allergy    Depression    Drug addiction (HCC)    GERD (gastroesophageal reflux disease)    History of chickenpox    Past Surgical History:  Past Surgical History:  Procedure Laterality Date   NO PAST SURGERIES     HPI:  Corey Rivera  is a 26 y.o. 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 presents to the ED after being found unresponsive at home with respiratory distress required 4 mg of Narcan at the scene, EMS gave additional 2 mg for total of 6 milligrams of Narcan  -  Patient is now more awake, patient does admit to fentanyl use  -UDS positive for cocaine, THC and benzos but negative for opiates  -patient was admitted that [facility] from 01/25/22 thru 01/30/22 for drug withdrawal  -Subsequently went to inpatient rehab treatment for 4 weeks, he was discharged from drug rehab in mid January   -Additional history obtained from patient and grandmother [name] who patient stays with, as well as patient's aunt [name]  -  Patient reports hearing loss bilaterally as well as numbness and tingling over the right upper and lower extremity-  -No chest pains no palpitations,   -In the ED patient is found to have an AKI with a creatinine of 2.17, hyperkalemia, as well as elevated CPK  -EKG without acute findings  -CT head without acute acute findings  -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 BSE requested.    Assessment / Plan / Recommendation  Clinical  Impression  Clinical swallow evaluation completed at bedside with Pt's aunt present. Pt reports improved hearing today and was able to follow all of my commands. Oral motor examination is WNL with the exception of white, patchy coating on his tongue and self reports of "burning". Oral care performed and coating removed, however tongue is red. Pt consumed ice chips, water via cup/straw, puree, and regular textures without incident. Pt was able to tell me today's date and who was playing in the [place] tomorrow. Speech was intermittently "slurred" and Pt's jaw shaking occasionally, however suspect this may be due to withdrawals versus dysarthria from stroke. Recommend regular textures and thin liquids, po medications whole with water. Pt's right arm is swollen and MD and RN notified. No further SLP services indicated at this time. SLP Visit Diagnosis: Dysphagia, unspecified (R13.10)    Aspiration Risk  No limitations    Diet Recommendation Regular;Thin liquid   Liquid Administration via: Cup;Straw Medication Administration: Whole meds with liquid Supervision: Patient able to self feed Postural Changes: Seated upright at 90 degrees;Remain upright for at least 30 minutes after po intake    Other  Recommendations Oral Care Recommendations: Oral care BID (RN to assess for thrush)    Recommendations for follow up therapy are one component of a multi-disciplinary discharge planning process, led by the attending physician.  Recommendations may be updated based on patient status, additional functional criteria and insurance authorization.  Follow up Recommendations No SLP follow up  Assistance Recommended at Discharge    Functional Status Assessment Patient has not had a recent decline in their functional status  Frequency and Duration            Prognosis        Swallow Study   General Date of Onset: 04/06/21 HPI: Corey Rivera  is a 26 y.o. 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  withdrawal presents to the ED after being found unresponsive at home with respiratory distress required 4 mg of Narcan at the scene, EMS gave additional 2 mg for total of 6 milligrams of Narcan  -  Patient is now more awake, patient does admit to fentanyl use  -UDS positive for cocaine, THC and benzos but negative for opiates  -patient was admitted that [facility] from 01/25/22 thru 01/30/22 for drug withdrawal  -Subsequently went to inpatient rehab treatment for 4 weeks, he was discharged from drug rehab in mid January   -Additional history obtained from patient and grandmother [name] who patient stays with, as well as patient's aunt [name]  -  Patient reports hearing loss bilaterally as well as numbness and tingling over the right upper and lower extremity-  -No chest pains no palpitations,   -In the ED patient is found to have an AKI with a creatinine of 2.17, hyperkalemia, as well as elevated CPK  -EKG without acute findings  -CT head without acute acute findings  -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 BSE requested. Type of Study: Bedside Swallow Evaluation Diet Prior to this Study:  (full liquids) Temperature Spikes Noted: No Respiratory Status: Room air History of Recent Intubation: No Behavior/Cognition: Alert;Cooperative;Pleasant mood Oral Cavity Assessment: Other (comment) (white coating and Pt reports burning) Oral Care Completed by SLP: Yes Oral Cavity - Dentition: Adequate natural dentition Vision: Functional for self-feeding Self-Feeding Abilities: Able to feed self Patient Positioning: Upright in bed Baseline Vocal Quality: Normal Volitional Cough: Strong Volitional Swallow: Able to elicit    Oral/Motor/Sensory Function Overall Oral Motor/Sensory Function: Within  functional limits   Ice Chips Ice chips: Within functional limits Presentation: Spoon   Thin Liquid Thin Liquid: Within functional limits Presentation: Cup;Self Fed;Straw    Nectar Thick Nectar Thick Liquid: Not tested   Honey Thick Honey Thick Liquid: Not tested   Puree Puree: Within functional limits Presentation: Self Fed;Spoon   Solid     Solid: Within functional limits Presentation: Self Fed     Thank you,  Havery Moros, CCC-SLP [phone]  Jamaree Hosier 04/06/2021,12:40 PM

## 2021-04-06 NOTE — BH Assessment (Addendum)
Clinician spoke with RN Tammy.  She said that patient was too sleepy to participate in assessment at this time.  Pt will be in ICU over the weekend due to being put on heprin.

## 2021-04-06 NOTE — Plan of Care (Signed)

## 2021-04-06 NOTE — Evaluation (Signed)
Physical Therapy Evaluation Patient Details Name: Corey Rivera MRN: [id number] DOB: 1995/12/17 Today's Date: 04/06/2021  History of Present Illness  Corey Rivera  is a 26 y.o. 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 presents to the ED after being found unresponsive at home with respiratory distress required 4 mg of Narcan at the scene, EMS gave additional 2 mg for total of 6 milligrams of Narcan  -  Patient is now more awake, patient does admit to fentanyl use  -UDS positive for cocaine, THC and benzos but negative for opiates  -patient was admitted that [facility] from 01/25/22 thru 01/30/22 for drug withdrawal  -Subsequently went to inpatient rehab treatment for 4 weeks, he was discharged from drug rehab in mid January   -Additional history obtained from patient and grandmother [name] who patient stays with, as well as patient's aunt [name]  -  Patient reports hearing loss bilaterally as well as numbness and tingling over the right upper and lower extremity-  -No chest pains no palpitations,   -In the ED patient is found to have an AKI with a creatinine of 2.17, hyperkalemia, as well as elevated CPK  -EKG without acute findings  -CT head without acute acute findings  -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  Patient unsteady on feet with frequent leaning on nearby objects for support demonstrating limited right ankle dorsiflexion with poor carryover for right heel to toe stepping, mostly slap foot, able to fully  dorsiflex right foot when seated, but limited when walking possibly due to c/o numbness, paresthesia RLE.  Patient tolerated sitting up in chair after therapy with his grandmother present in room - RN notified.  Patient will benefit from continued skilled physical therapy in hospital and recommended venue below to increase strength, balance, endurance for safe ADLs and gait.       Recommendations for follow up therapy are one component of a multi-disciplinary discharge planning process, led by the attending physician.  Recommendations may be updated based on patient status, additional functional criteria and insurance authorization.  Follow Up Recommendations Acute inpatient rehab (3hours/day)    Assistance Recommended at Discharge Set up Supervision/Assistance  Patient can return home with the following  A little help with walking and/or transfers;A little help with bathing/dressing/bathroom;Help with stairs or ramp for entrance;Assistance with cooking/housework;Assist for transportation    Equipment Recommendations Cane  Recommendations for Other Services       Functional Status Assessment Patient has had a recent decline in their functional status and demonstrates the ability to make significant improvements in function in a reasonable and predictable amount of time.     Precautions / Restrictions Precautions Precautions: Fall Restrictions Weight Bearing Restrictions: No      Mobility  Bed Mobility Overal bed mobility: Modified Independent                  Transfers Overall transfer level: Needs assistance Equipment used: None, 1 person hand held assist Transfers: Sit to/from Stand, Bed to chair/wheelchair/BSC Sit to Stand: Min guard   Step pivot transfers: Min guard  General transfer comment: unsteady labored movement with mild difficulty advancing RLE    Ambulation/Gait Ambulation/Gait assistance: Min guard, Min assist Gait Distance (Feet): 65  Feet Assistive device: 1 person hand held assist Gait Pattern/deviations: Decreased step length - right, Decreased stance time - right, Decreased dorsiflexion - right, Antalgic, Steppage Gait velocity: decreased     General Gait Details: slow labored cadence with decreased dorsiflexion/proprioception RLE, unsteady with frequent leaning on nearby objects for support  Stairs            Wheelchair Mobility    Modified Rankin (Stroke Patients Only)       Balance Overall balance assessment: Needs assistance Sitting-balance support: Feet supported, No upper extremity supported Sitting balance-Leahy Scale: Good Sitting balance - Comments: seated at EOB   Standing balance support: During functional activity, No upper extremity supported Standing balance-Leahy Scale: Poor Standing balance comment: fair static, fair/poor dynamic                             Pertinent Vitals/Pain Pain Assessment Pain Assessment: No/denies pain    Home Living Family/patient expects to be discharged to:: Private residence Living Arrangements: Alone Available Help at Discharge: Family;Available PRN/intermittently (plan to stay with his aunt) Type of Home: House Home Access: Level entry     Alternate Level Stairs-Number of Steps: used for storage, does not have to go upstairs Home Layout: Two level;Able to live on main level with bedroom/bathroom;Full bath on main level Home Equipment: Grab bars - tub/shower      Prior Function Prior Level of Function : Independent/Modified Independent             Mobility Comments: Tourist information centre manager, drives ADLs Comments: Independent     Hand Dominance   Dominant Hand: Right    Extremity/Trunk Assessment   Upper Extremity Assessment Upper Extremity Assessment: Defer to OT evaluation    Lower Extremity Assessment Lower Extremity Assessment: Generalized weakness;RLE deficits/detail RLE Deficits / Details: grossly -4/5 except ankle  dorsiflexion 3+/5 RLE Sensation: decreased proprioception RLE Coordination: decreased gross motor    Cervical / Trunk Assessment Cervical / Trunk Assessment: Normal  Communication   Communication: HOH  Cognition Arousal/Alertness: Awake/alert Behavior During Therapy: WFL for tasks assessed/performed Overall Cognitive Status: Within Functional Limits for tasks assessed                                          General Comments      Exercises     Assessment/Plan    PT Assessment Patient needs continued PT services  PT Problem List Decreased strength;Decreased activity tolerance;Decreased balance;Decreased mobility       PT Treatment Interventions DME instruction;Gait training;Stair training;Functional mobility training;Therapeutic activities;Therapeutic exercise;Patient/family education;Balance training    PT Goals (Current goals can be found in the Care Plan section)  Acute Rehab PT Goals Patient Stated Goal: return home with family to assist PT Goal Formulation: With patient/family Time For Goal Achievement: 04/20/21 Potential to Achieve Goals: Good    Frequency Min 5X/week     Co-evaluation               AM-PAC PT "6 Clicks" Mobility  Outcome Measure Help needed turning from your back to your side while in a flat bed without using bedrails?: None Help needed moving from lying on your back to sitting on the side of  a flat bed without using bedrails?: None Help needed moving to and from a bed to a chair (including a wheelchair)?: A Little Help needed standing up from a chair using your arms (e.g., wheelchair or bedside chair)?: A Little Help needed to walk in hospital room?: A Lot Help needed climbing 3-5 steps with a railing? : A Lot 6 Click Score: 18    End of Session   Activity Tolerance: Patient tolerated treatment well;Patient limited by fatigue Patient left: in chair;with call bell/phone within reach;with family/visitor present Nurse  Communication: Mobility status PT Visit Diagnosis: Unsteadiness on feet (R26.81);Other abnormalities of gait and mobility (R26.89);Muscle weakness (generalized) (M62.81)    Time: 2025-4270 PT Time Calculation (min) (ACUTE ONLY): 32 min   Charges:   PT Evaluation $PT Eval Moderate Complexity: 1 Mod PT Treatments $Therapeutic Activity: 23-37 mins        10:59 AM, 04/06/21 [name], MPT Physical Therapist with [facility] 336 [phone] office [phone] mobile phone

## 2021-04-06 NOTE — Progress Notes (Signed)
Echocardiogram 2D Echocardiogram has been performed.  Warren Lacy Kenneth Lax RDCS 04/06/2021, 8:16 AM

## 2021-04-06 NOTE — Plan of Care (Signed)
°  Problem: Acute Rehab PT Goals(only PT should resolve) Goal: Pt Will Go Supine/Side To Sit Outcome: Progressing Flowsheets (Taken 04/06/2021 1101) Pt will go Supine/Side to Sit:  Independently  with modified independence Goal: Patient Will Transfer Sit To/From Stand Outcome: Progressing Flowsheets (Taken 04/06/2021 1101) Patient will transfer sit to/from stand: with supervision Goal: Pt Will Transfer Bed To Chair/Chair To Bed Outcome: Progressing Flowsheets (Taken 04/06/2021 1101) Pt will Transfer Bed to Chair/Chair to Bed: with supervision Goal: Pt Will Ambulate Outcome: Progressing Flowsheets (Taken 04/06/2021 1101) Pt will Ambulate:  100 feet  with supervision  with least restrictive assistive device   11:01 AM, 04/06/21 [name], MPT Physical Therapist with [facility] 336 [phone] office [phone] mobile phone

## 2021-04-06 NOTE — Progress Notes (Signed)
°-  Mentation appears back to baseline -Patient is cooperative and polite  -Patient is medically cleared for possible transfer to inpatient rehab/CIR  -Continue PT, OT eval,  speech evaluation appreciated  Shon Hale, MD

## 2021-04-06 NOTE — Progress Notes (Addendum)
ANTICOAGULATION CONSULT NOTE -  Consult  Pharmacy Consult for heparin gtt  Indication: DVT  No Known Allergies  Patient Measurements: Height: 5' 9.02" (175.3 cm) Weight: 85.2 kg (187 lb 13.3 oz) IBW/kg (Calculated) : 70.74 Heparin Dosing Weight: HEPARIN DW (KG): 83.6   Vital Signs: Temp: 97.8 F (36.6 C) (02/11 1959) Temp Source: Oral (02/11 1959) BP: 106/66 (02/11 1500) Pulse Rate: 89 (02/11 1500)  Labs: Recent Labs    04/05/21 1112 04/05/21 1423 04/06/21 0548 04/06/21 1928  HGB 15.5  --  15.1  --   HCT 48.6  --  43.9  --   PLT 285  --  178  --   APTT 33  --   --   --   LABPROT 16.5*  --   --   --   INR 1.3*  --   --   --   HEPARINUNFRC  --   --   --  0.20*  CREATININE 2.17* 1.70* 1.16  --   CKTOTAL 2,361*  --  3,180*  --      Estimated Creatinine Clearance: 107.2 mL/min (by C-G formula based on SCr of 1.16 mg/dL).   Medical History: Past Medical History:  Diagnosis Date   Allergy    Depression    Drug addiction (HCC)    GERD (gastroesophageal reflux disease)    History of chickenpox     Medications:  Medications Prior to Admission  Medication Sig Dispense Refill Last Dose   busPIRone (BUSPAR) 5 MG tablet Take 1 tablet (5 mg total) by mouth 2 (two) times daily. (Patient not taking: Reported on 04/05/2021) 60 tablet 1 Not Taking   escitalopram (LEXAPRO) 20 MG tablet Take 1 tablet (20 mg total) by mouth daily. (Patient not taking: Reported on 04/05/2021) 30 tablet 1 Not Taking   pantoprazole (PROTONIX) 40 MG tablet Take 1 tablet (40 mg total) by mouth daily. (Patient not taking: Reported on 04/05/2021) 30 tablet 3 Not Taking   Scheduled:   Chlorhexidine Gluconate Cloth  6 each Topical Q0600   dextromethorphan-guaiFENesin  1 tablet Oral BID   melatonin  6 mg Oral Once   multivitamin with minerals  1 tablet Oral Daily   mupirocin ointment  1 application Nasal BID   pantoprazole (PROTONIX) IV  40 mg Intravenous Daily   prochlorperazine  25 mg Rectal Once    sodium chloride flush  3 mL Intravenous Q12H   sodium chloride flush  3 mL Intravenous Q12H   Infusions:   sodium chloride     ceFEPime (MAXIPIME) IV 2 g (04/06/21 1400)   dextrose 5 % and 0.45% NaCl 150 mL/hr at 04/06/21 1641   heparin 1,350 Units/hr (04/06/21 1448)   PRN: sodium chloride, acetaminophen **OR** acetaminophen, albuterol, bisacodyl, ondansetron **OR** ondansetron (ZOFRAN) IV, polyethylene glycol, sodium chloride flush Anti-infectives (From admission, onward)    Start     Dose/Rate Route Frequency Ordered Stop   04/06/21 0200  vancomycin (VANCOREADY) IVPB 750 mg/150 mL  Status:  Discontinued        750 mg 150 mL/hr over 60 Minutes Intravenous Every 12 hours 04/05/21 1218 04/06/21 1308   04/06/21 0000  ceFEPIme (MAXIPIME) 2 g in sodium chloride 0.9 % 100 mL IVPB        2 g 200 mL/hr over 30 Minutes Intravenous Every 12 hours 04/05/21 1213     04/05/21 1215  vancomycin (VANCOREADY) IVPB 1750 mg/350 mL        1,750 mg 175 mL/hr over 120 Minutes Intravenous  Once 04/05/21 1204 04/05/21 1646   04/05/21 1200  ceFEPIme (MAXIPIME) 2 g in sodium chloride 0.9 % 100 mL IVPB        2 g 200 mL/hr over 30 Minutes Intravenous  Once 04/05/21 1155 04/05/21 1343   04/05/21 1200  metroNIDAZOLE (FLAGYL) IVPB 500 mg        500 mg 100 mL/hr over 60 Minutes Intravenous  Once 04/05/21 1155 04/05/21 1343   04/05/21 1200  vancomycin (VANCOCIN) IVPB 1000 mg/200 mL premix  Status:  Discontinued        1,000 mg 200 mL/hr over 60 Minutes Intravenous  Once 04/05/21 1155 04/05/21 1204       Assessment: Oaklyn Mans a 26 y.o. male requires anticoagulation with a heparin iv infusion for the indication of  DVT. Heparin gtt will be started following pharmacy protocol per pharmacy consult. Patient is not on previous oral anticoagulant that will require aPTT/HL correlation before transitioning to only HL monitoring.   HL 0.20, subtherapeutic   Goal of Therapy:  Heparin level 0.3-0.7  units/ml Monitor platelets by anticoagulation protocol: Yes   Plan:  Give 0 per MD units bolus x 1 Increase heparin infusion to 1650 units/hr Check anti-Xa level in 6 hours and daily while on heparin Continue to monitor H&H and platelets   Gerre Pebbles Fred Franzen 04/06/2021,8:40 PM

## 2021-04-06 NOTE — Progress Notes (Signed)
PROGRESS NOTE     Corey Rivera, is a 26 y.o. male, DOB - January 12, 1996, HGD:[id number]  Admit date - 04/05/2021   Admitting Physician Anjulie Dipierro Denton Brick, MD  Outpatient Primary MD for the patient is Pcp, No  LOS - 1  Chief Complaint  Patient presents with   Drug Overdose        Brief Narrative:  - 26 y.o. 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 -CT head noted without acute findings -MRA head and MRA neck w/o LVO or hemodynamically significant stenosis -Hold off on Lipitor given elevated CKs -Allow permissive hypertension, avoid hypotension -Patient with history of cocaine use--??  Some component of vasospasms contributing to his stroke -Keep patient on telemetry and watch for arrhythmias -TSH-WNL -LDL steady, HDL 36 total cholesterol 70 triglycerides 18 -A1c 4.6 -PT, OT and speech evaluation appreciated and recommends inpatient rehab/CIR -Treated with aspirin, okay to stop aspirin while initiating IV heparin for possible right leg DVT -Patient's hearing is improving -Echo pending -Right-sided subtle hemiparesis and sensory loss with difficulty with fine motor function especially in the right upper extremity noted   2) acute metabolic encephalopathy/drug overdose-- -likely related to acute stroke and polysubstance abuse -UDS positive for cocaine, benzodiazepine and  THC -Patient states he used fentanyl as well--UDS did not show opiates -The patient responded very well to Narcan -Mentation appears back to baseline -Patient is cooperative and polite   3) right leg swelling----possible DVT -With on-call neurologist Dr.Arora-states okay to use IV heparin without bolus -IV heparin pending venous Doppler study -No IV heparin bolus given due to acute stroke  4) social/ethics--- patient was admitted that [facility] from 01/25/22 thru 01/30/22 for drug withdrawal -Subsequently went to inpatient rehab treatment for 4 weeks, he was discharged from drug rehab in mid January  -Additional history obtained from patient and grandmother Corey Rivera who patient stays with, as well as patient's aunt Corey Rivera -Patient is a full code --Mentation appears back to baseline -Patient is cooperative and polite   5)) hypertension/leukocytosis/lactic acidosis/hypothermia--query reactive due to persistent drug abuse, acute stroke, compounded by dehydration/volume depletion --Patient met/criteria for SIRS with tachypnea, tachycardia and leukocytosis -Continue cefepime and vancomycin pending further culture data -Chest x-ray without acute infection -UA is not suggestive of UTI -WBC is down to 21.5 from 25.3   6)Elevated CPK/AKI/hyperkalemia/-hyponatremia---acute kidney injury  -On admission creatinine was 2.17, CPK 2361 -Patient received hyperkalemia cocktail -EKG without acute changes -Potassium normalized -Creatinine normalized -Lactic acid is down to 1.3 from greater than 9 -Continue to hydrate aggressively CPK 2,361 >> 3,180 - renally adjust medications, avoid nephrotoxic agents / dehydration  / hypotension   7) history of hep C--- LFTs noted, patient will need outpatient follow-up   8) prior history of seizures in the setting of drug/opiate withdrawal--- no evidence of seizures at this time, EEG without epileptiform finding   Disposition/Need  for in-Hospital Stay- patient unable to be discharged at this time  due to acute stroke requiring further interventions/work-up SIRS physiology requiring aggressive IV fluids and IV antibiotics pending further culture data -Possible DVT requiring IV heparin   Status is: Inpatient   Remains inpatient appropriate because:    Dispo: The patient is from: Home              Anticipated d/c is to: Home              Anticipated d/c date is: 3 days              Patient currently is medically stable to d/c. Barriers: -Inpatient rehab bed availability and insurance approval    Code Status :  -  Code Status: Full Code   Family Communication:    grandmother Corey Rivera who patient stays with, as well as patient's aunt Corey Rivera  DVT Prophylaxis  :   -IV heparin for now  Lab Results  Component Value Date   PLT 178 04/06/2021    Inpatient Medications  Scheduled Meds:  Chlorhexidine Gluconate Cloth  6 each Topical Q0600   dextromethorphan-guaiFENesin  1 tablet Oral BID   multivitamin with minerals  1 tablet Oral Daily   mupirocin ointment  1 application Nasal BID   pantoprazole (PROTONIX) IV  40 mg Intravenous Daily   prochlorperazine  25 mg Rectal Once   sodium chloride flush  3 mL Intravenous Q12H   sodium chloride flush  3 mL Intravenous Q12H   sodium zirconium cyclosilicate  10 g Oral TID   Continuous Infusions:  sodium chloride     ceFEPime (MAXIPIME) IV 2 g (04/06/21 0028)   dextrose 5 % and 0.45% NaCl 150 mL/hr at 04/06/21 0025   heparin     PRN Meds:.sodium chloride, acetaminophen **OR** acetaminophen, albuterol, bisacodyl, ondansetron **OR** ondansetron (ZOFRAN) IV, polyethylene glycol, sodium chloride flush   Anti-infectives (From admission, onward)    Start     Dose/Rate Route Frequency Ordered Stop   04/06/21 0200  vancomycin (VANCOREADY) IVPB 750 mg/150 mL  Status:  Discontinued        750 mg 150 mL/hr over 60 Minutes Intravenous Every 12 hours 04/05/21 1218 04/06/21  1308   04/06/21 0000  ceFEPIme (MAXIPIME) 2 g in sodium chloride 0.9 % 100 mL IVPB        2 g 200 mL/hr over 30 Minutes Intravenous Every 12 hours 04/05/21 1213     04/05/21 1215  vancomycin (VANCOREADY) IVPB 1750 mg/350 mL        1,750 mg 175 mL/hr over 120 Minutes Intravenous  Once 04/05/21 1204 04/05/21 1646   04/05/21 1200  ceFEPIme (MAXIPIME) 2 g in sodium chloride 0.9 % 100 mL IVPB        2 g 200 mL/hr over 30 Minutes Intravenous  Once 04/05/21 1155 04/05/21 1343   04/05/21 1200  metroNIDAZOLE (FLAGYL) IVPB 500 mg        500 mg 100 mL/hr over 60 Minutes Intravenous  Once 04/05/21 1155 04/05/21 1343   04/05/21 1200  vancomycin (VANCOCIN) IVPB 1000 mg/200 mL premix  Status:  Discontinued        1,000 mg 200 mL/hr over 60 Minutes Intravenous  Once 04/05/21 1155 04/05/21 1204         Subjective: Nicky Pugh today has no fevers, no emesis,  No chest pain,   -Right leg swelling and pain -Hearing improving -Eating and drinking okay patient's aunt Corey Rivera is at bedside   Objective: Vitals:   04/06/21 0500 04/06/21 0600 04/06/21  0746 04/06/21 1125  BP: (!) 102/50 (!) 94/46    Pulse: 98 95 92 92  Resp: _0 Temp: 97.6 F (36.4 C)  97.9 F (36.6 C) 97.9 F (36.6 C)  TempSrc: Oral  Oral Oral  SpO2: 95% 90% 97% 99%  Weight: 85.2 kg     Height:        Intake/Output Summary (Last 24 hours) at 04/06/2021 1311 Last data filed at 04/06/2021 0600 Gross per 24 hour  Intake 4975.87 ml  Output 1350 ml  Net 3625.87 ml   Filed Weights   04/05/21 0959 04/05/21 1600 04/06/21 0500  Weight: 83.6 kg 83.6 kg 85.2 kg    Physical Exam  Gen:- Awake Alert, in no apparent distress  HEENT:- [employer].AT, No sclera icterus Ears--- hearing improving Neck-Supple Neck,No JVD,.  Lungs-  CTAB , fair symmetrical air movement CV- S1, S2 normal, regular  Abd-  +ve B.Sounds, Abd Soft, No tenderness,    Extremity/Skin:-Right calf swelling and tenderness, pedal pulses present   Psych-affect is appropriate, oriented x3, -Mentation appears back to baseline, -Patient is cooperative and polite Neuro--Right-sided subtle hemiparesis and sensory loss with difficulty with fine motor function especially in the right upper extremity noted  Data Reviewed: I have personally reviewed following labs and imaging studies  CBC: Recent Labs  Lab 04/05/21 1112 04/06/21 0548  WBC 25.3* 21.5*  NEUTROABS 21.5*  --   HGB 15.5 15.1  HCT 48.6 43.9  MCV 100.4* 97.1  PLT 285 017   Basic Metabolic Panel: Recent Labs  Lab 04/05/21 1112 04/05/21 1423 04/06/21 0548  NA 136 134* 133*  K 6.2* 4.9 4.5  CL 95* 100 103  CO2 17* 21* 23  GLUCOSE 76 207* 132*  BUN _1 CREATININE 2.17* 1.70* 1.16  CALCIUM 9.0 7.8* 7.8*   GFR: Estimated Creatinine Clearance: 107.2 mL/min (by C-G formula based on SCr of 1.16 mg/dL). Liver Function Tests: Recent Labs  Lab 04/05/21 1112 04/06/21 0548  AST 443* 1,424*  ALT 272* 479*  ALKPHOS 130* 66  BILITOT 1.3* 0.7  PROT 7.7 4.9*  ALBUMIN 4.6 2.9*   Cardiac Enzymes: Recent Labs  Lab 04/05/21 1112 04/06/21 0548  CKTOTAL 2,361* 3,180*   BNP (last 3 results) No results for input(s): PROBNP in the last 8760 hours. HbA1C: Recent Labs    04/05/21 1025  HGBA1C 4.6*   Sepsis Labs: _2 (procalcitonin:4,lacticidven:4) ) Recent Results (from the past 240 hour(s))  Blood Culture (routine x 2)     Status: None (Preliminary result)   Collection Time: 04/05/21 11:12 AM   Specimen: BLOOD RIGHT HAND  Result Value Ref Range Status   Specimen Description BLOOD RIGHT HAND BOTTLES DRAWN AEROBIC ONLY  Final   Special Requests Blood Culture adequate volume  Final   Culture   Final    NO GROWTH < 24 HOURS Performed at [facility], [address], [place], [place] [address]    Report Status PENDING  Incomplete  Blood Culture (routine x 2)     Status: None (Preliminary result)   Collection Time: 04/05/21 11:12 AM   Specimen: BLOOD  LEFT HAND  Result Value Ref Range Status   Specimen Description BLOOD LEFT HAND BOTTLES DRAWN AEROBIC ONLY  Final   Special Requests Blood Culture adequate volume  Final   Culture   Final    NO GROWTH < 24 HOURS Performed at [facility], [address], [place], [place] [address]    Report Status PENDING  Incomplete  Resp Panel by RT-PCR (Flu A&B, Covid) Nasopharyngeal Swab     Status: None   Collection Time: 04/05/21  1:44 PM   Specimen: Nasopharyngeal Swab; Nasopharyngeal(NP) swabs in vial transport medium  Result Value Ref Range Status   SARS Coronavirus 2 by RT PCR NEGATIVE NEGATIVE Final    Comment: (NOTE) SARS-CoV-2 target nucleic acids are NOT DETECTED.  The SARS-CoV-2 RNA is generally detectable in upper respiratory specimens during the acute phase of infection. The lowest concentration of SARS-CoV-2 viral copies this assay can detect is 138 copies/mL. A negative result does not preclude SARS-Cov-2 infection and should not be used as the sole basis for treatment or other patient management decisions. A negative result may occur with  improper specimen collection/handling, submission of specimen other than nasopharyngeal swab, presence of viral mutation(s) within the areas targeted by this assay, and inadequate number of viral copies(<138 copies/mL). A negative result must be combined with clinical observations, patient history, and epidemiological information. The expected result is Negative.  Fact Sheet for Patients:  EntrepreneurPulse.com.au  Fact Sheet for Healthcare Providers:  IncredibleEmployment.be  This test is no t yet approved or cleared by the [place] FDA and  has been authorized for detection and/or diagnosis of SARS-CoV-2 by FDA under an Emergency Use Authorization (EUA). This EUA will remain  in effect (meaning this test can be used) for the duration of the COVID-19 declaration under Section 564(b)(1) of the Act,  21 U.S.C.section 360bbb-3(b)(1), unless the authorization is terminated  or revoked sooner.       Influenza A by PCR NEGATIVE NEGATIVE Final   Influenza B by PCR NEGATIVE NEGATIVE Final    Comment: (NOTE) The Xpert Xpress SARS-CoV-2/FLU/RSV plus assay is intended as an aid in the diagnosis of influenza from Nasopharyngeal swab specimens and should not be used as a sole basis for treatment. Nasal washings and aspirates are unacceptable for Xpert Xpress SARS-CoV-2/FLU/RSV testing.  Fact Sheet for Patients: EntrepreneurPulse.com.au  Fact Sheet for Healthcare Providers: IncredibleEmployment.be  This test is not yet approved or cleared by the [place] FDA and has been authorized for detection and/or diagnosis of SARS-CoV-2 by FDA under an Emergency Use Authorization (EUA). This EUA will remain in effect (meaning this test can be used) for the duration of the COVID-19 declaration under Section 564(b)(1) of the Act, 21 U.S.C. section 360bbb-3(b)(1), unless the authorization is terminated or revoked.  Performed at [facility], [address], [place], [place] [address]   MRSA Next Gen by PCR, Nasal     Status: Abnormal   Collection Time: 04/05/21  4:34 PM   Specimen: Nasal Mucosa; Nasal Swab  Result Value Ref Range Status   MRSA by PCR Next Gen DETECTED (A) NOT DETECTED Final    Comment: RESULT CALLED TO, READ BACK BY AND VERIFIED WITH: MANYARD,R AT 2130 ON 2.10.23 BY RUCINSKI,B (NOTE) The GeneXpert MRSA Assay (FDA approved for NASAL specimens only), is one component of a comprehensive MRSA colonization surveillance program. It is not intended to diagnose MRSA infection nor to guide or monitor treatment for MRSA infections. Test performance is not FDA approved in patients less than 68 years old. Performed at [facility], [address] [place], [place] [address]       Radiology Studies: CT Head Wo Contrast  Result Date:  04/05/2021 CLINICAL DATA:  26 year old male with history of persistent and recurrent dizziness. EXAM: CT HEAD WITHOUT CONTRAST TECHNIQUE: Contiguous axial images were obtained from the base of the skull through  the vertex without intravenous contrast. RADIATION DOSE REDUCTION: This exam was performed according to the departmental dose-optimization program which includes automated exposure control, adjustment of the mA and/or kV according to patient size and/or use of iterative reconstruction technique. COMPARISON:  Head CT 01/25/2021. FINDINGS: Brain: No evidence of acute infarction, hemorrhage, hydrocephalus, extra-axial collection or mass lesion/mass effect. Vascular: No hyperdense vessel or unexpected calcification. Skull: Normal. Negative for fracture or focal lesion. Sinuses/Orbits: No acute finding. Other: None. IMPRESSION: No acute intracranial abnormalities. The appearance of the brain is normal. Electronically Signed   By: Vinnie Langton M.D.   On: 04/05/2021 10:41   MR ANGIO HEAD WO CONTRAST  Result Date: 04/05/2021 CLINICAL DATA:  Initial evaluation for acute stroke. EXAM: MRA NECK WITHOUT AND WITH CONTRAST MRA HEAD WITHOUT CONTRAST TECHNIQUE: Multiplanar and multiecho pulse sequences of the neck were obtained without and with intravenous contrast. Angiographic images of the neck were obtained using MRA technique without and with intravenous contrast; Angiographic images of the Circle of Willis were obtained using MRA technique without intravenous contrast. CONTRAST:  34m GADAVIST GADOBUTROL 1 MMOL/ML IV SOLN COMPARISON:  Prior brain MRI from earlier the same day. FINDINGS: MRA NECK FINDINGS AORTIC ARCH: Visualized aortic arch normal caliber with normal branch pattern. No stenosis about the origin the great vessels. RIGHT CAROTID SYSTEM: Right common and internal carotid arteries patent without stenosis or evidence for dissection. LEFT CAROTID SYSTEM: Left common and internal carotid arteries widely  patent without stenosis or evidence for dissection. VERTEBRAL ARTERIES: Both vertebral arteries arise from the subclavian arteries. No proximal subclavian artery stenosis. Right vertebral artery slightly dominant. Vertebral arteries patent without stenosis or evidence for dissection. MRA HEAD FINDINGS ANTERIOR CIRCULATION: Both internal carotid arteries widely patent to the termini without stenosis or other abnormality. A1 segments widely patent. Normal anterior communicating artery complex. Anterior cerebral arteries patent without stenosis. No M1 stenosis or occlusion. Normal MCA bifurcations. Distal MCA branches perfused and symmetric. POSTERIOR CIRCULATION: Both vertebral arteries widely patent to the vertebrobasilar junction without stenosis. Right vertebral artery slightly dominant. Neither PICA origin well visualized. Basilar widely patent. Superior cerebral arteries patent. Both PCA supplied via the basilar as well as prominent bilateral posterior communicating arteries. Both PCAs well perfused or distal aspects without stenosis. No intracranial aneurysm. IMPRESSION: Normal MRA of the head and neck. No large vessel occlusion, hemodynamically significant stenosis, or other acute vascular abnormality. Electronically Signed   By: BJeannine BogaM.D.   On: 04/05/2021 19:56   MR ANGIO NECK W WO CONTRAST  Result Date: 04/05/2021 CLINICAL DATA:  Initial evaluation for acute stroke. EXAM: MRA NECK WITHOUT AND WITH CONTRAST MRA HEAD WITHOUT CONTRAST TECHNIQUE: Multiplanar and multiecho pulse sequences of the neck were obtained without and with intravenous contrast. Angiographic images of the neck were obtained using MRA technique without and with intravenous contrast; Angiographic images of the Circle of Willis were obtained using MRA technique without intravenous contrast. CONTRAST:  843mGADAVIST GADOBUTROL 1 MMOL/ML IV SOLN COMPARISON:  Prior brain MRI from earlier the same day. FINDINGS: MRA NECK  FINDINGS AORTIC ARCH: Visualized aortic arch normal caliber with normal branch pattern. No stenosis about the origin the great vessels. RIGHT CAROTID SYSTEM: Right common and internal carotid arteries patent without stenosis or evidence for dissection. LEFT CAROTID SYSTEM: Left common and internal carotid arteries widely patent without stenosis or evidence for dissection. VERTEBRAL ARTERIES: Both vertebral arteries arise from the subclavian arteries. No proximal subclavian artery stenosis. Right vertebral artery slightly dominant. Vertebral arteries  patent without stenosis or evidence for dissection. MRA HEAD FINDINGS ANTERIOR CIRCULATION: Both internal carotid arteries widely patent to the termini without stenosis or other abnormality. A1 segments widely patent. Normal anterior communicating artery complex. Anterior cerebral arteries patent without stenosis. No M1 stenosis or occlusion. Normal MCA bifurcations. Distal MCA branches perfused and symmetric. POSTERIOR CIRCULATION: Both vertebral arteries widely patent to the vertebrobasilar junction without stenosis. Right vertebral artery slightly dominant. Neither PICA origin well visualized. Basilar widely patent. Superior cerebral arteries patent. Both PCA supplied via the basilar as well as prominent bilateral posterior communicating arteries. Both PCAs well perfused or distal aspects without stenosis. No intracranial aneurysm. IMPRESSION: Normal MRA of the head and neck. No large vessel occlusion, hemodynamically significant stenosis, or other acute vascular abnormality. Electronically Signed   By: Jeannine Boga M.D.   On: 04/05/2021 19:56   MR BRAIN WO CONTRAST  Result Date: 04/05/2021 CLINICAL DATA:  Provided history: Mental status change, alcohol/drug use. Additional history provided: Patient found unresponsive today. EXAM: MRI HEAD WITHOUT CONTRAST TECHNIQUE: Multiplanar, multiecho pulse sequences of the brain and surrounding structures were  obtained without intravenous contrast. COMPARISON:  Head CT 04/05/2021.  Brain MRI 01/26/2021. FINDINGS: Brain: Cerebral volume is normal. Subcentimeter acute infarct within the mid-to-inferior left cerebellar hemisphere (series 5, image 8). No cortical encephalomalacia is identified. No significant cerebral white matter disease. No evidence of an intracranial mass. No chronic intracranial blood products. No extra-axial fluid collection. No midline shift. Mega cisterna magna. Vascular: Maintained flow voids within the proximal large arterial vessels. Skull and upper cervical spine: No focal suspicious marrow lesion. Sinuses/Orbits: Visualized orbits show no acute finding. Mild mucosal thickening within the bilateral ethmoid and sphenoid sinuses. Mild-to-moderate mucosal thickening within the bilateral maxillary sinuses. IMPRESSION: Subcentimeter acute infarct within the mid-to-inferior left cerebellar hemisphere. Otherwise unremarkable non-contrast MRI appearance of the brain. Paranasal sinus disease, as described. Electronically Signed   By: Kellie Simmering D.O.   On: 04/05/2021 16:11   DG Chest Port 1 View  Result Date: 04/05/2021 CLINICAL DATA:  Cardiopulmonary arrest. Possible drug overdose. Questionable sepsis-evaluate for abnormality. EXAM: PORTABLE CHEST 1 VIEW COMPARISON:  Radiographs 03/25/2017 and 03/23/2017. FINDINGS: 1005 hours. The heart size and mediastinal contours are normal. The lungs are clear. There is no pleural effusion or pneumothorax. No acute osseous findings are identified. Telemetry leads overlie the chest. IMPRESSION: No evidence of active cardiopulmonary process. Electronically Signed   By: Richardean Sale M.D.   On: 04/05/2021 10:26   EEG adult  Result Date: 04/05/2021 Catha Gosselin, DO     04/05/2021  8:38 PM [facility] Tele[facility] Routine EEG Report Patient Name:   Severus, Brodzinski Date of Birth:   1995/06/09 Identification Number:   MRN -  [id number] Date of Study:   04/05/2021 17:41:27 Duration: 23 minutes Indication: Encephalopathy Technical Summary: A routine 20 channel electroencephalogram using the international 10-20 system of electrode placement was performed. Background: 5-6 Hz, Poorly formed States: Drowsy and Asleep - Vertex Waves, K Complexes were seen during asleep Abnormalities: - Generalized Slowing: Diffuse generalized slowing - Background Slowing: The background consists of 20-50 uV, 5-6 Hz diffuse activity with superimposed diffuse polymorphic delta activity that is non reactive to external stimulation. - When most awake this reaches 7-8 Hz. Activation Procedures: - Hyperventilation and Photic Stimulation were not performed Classification: Abnormal Clinical Correlation: - This EEG is abnormal due to mild generalized slowing. -  This is a technically limited study, throughout the recording he is asleep. - Generalized  slowing is a nonspecific finding but is consistent with a generalized disturbance of cerebral function and may be seen in toxic, metabolic, post anoxic, multifocal or diffuse structural abnormalities. - No electrographic seizures were recorded. - Clinical correlation is recommended. Dr Annabelle Harman [facility] [phone] Case [id number]    Scheduled Meds:  Chlorhexidine Gluconate Cloth  6 each Topical Q0600   dextromethorphan-guaiFENesin  1 tablet Oral BID   multivitamin with minerals  1 tablet Oral Daily   mupirocin ointment  1 application Nasal BID   pantoprazole (PROTONIX) IV  40 mg Intravenous Daily   prochlorperazine  25 mg Rectal Once   sodium chloride flush  3 mL Intravenous Q12H   sodium chloride flush  3 mL Intravenous Q12H   sodium zirconium cyclosilicate  10 g Oral TID   Continuous Infusions:  sodium chloride     ceFEPime (MAXIPIME) IV 2 g (04/06/21 0028)   dextrose 5 % and 0.45% NaCl 150 mL/hr at 04/06/21 0025   heparin       LOS: 1 day    Roxan Hockey M.D on 04/06/2021 at 1:11  PM  Go to [email or web address] - for contact info  [facility] - Office  [phone]  If 7PM-7AM, please contact night-coverage [email or web address] Password [email or web address] 04/06/2021, 1:11 PM

## 2021-04-07 ENCOUNTER — Inpatient Hospital Stay (HOSPITAL_COMMUNITY): Payer: BC Managed Care – PPO

## 2021-04-07 LAB — CBC
HCT: 41.1 % (ref 39.0–52.0)
Hemoglobin: 13.4 g/dL (ref 13.0–17.0)
MCH: 32.7 pg (ref 26.0–34.0)
MCHC: 32.6 g/dL (ref 30.0–36.0)
MCV: 100.2 fL — ABNORMAL HIGH (ref 80.0–100.0)
Platelets: 128 10*3/uL — ABNORMAL LOW (ref 150–400)
RBC: 4.1 MIL/uL — ABNORMAL LOW (ref 4.22–5.81)
RDW: 14.3 % (ref 11.5–15.5)
WBC: 10.1 10*3/uL (ref 4.0–10.5)
nRBC: 0 % (ref 0.0–0.2)

## 2021-04-07 LAB — URINE CULTURE: Culture: NO GROWTH

## 2021-04-07 LAB — RENAL FUNCTION PANEL
Albumin: 3 g/dL — ABNORMAL LOW (ref 3.5–5.0)
Anion gap: 6 (ref 5–15)
BUN: 12 mg/dL (ref 6–20)
CO2: 25 mmol/L (ref 22–32)
Calcium: 8 mg/dL — ABNORMAL LOW (ref 8.9–10.3)
Chloride: 104 mmol/L (ref 98–111)
Creatinine, Ser: 0.96 mg/dL (ref 0.61–1.24)
GFR, Estimated: >60 mL/min (ref >60)
Glucose, Bld: 108 mg/dL — ABNORMAL HIGH (ref 70–99)
Phosphorus: 1.5 mg/dL — ABNORMAL LOW (ref 2.5–4.6)
Potassium: 4.3 mmol/L (ref 3.5–5.1)
Sodium: 135 mmol/L (ref 135–145)

## 2021-04-07 LAB — GLUCOSE, CAPILLARY: Glucose-Capillary: 104 mg/dL — ABNORMAL HIGH (ref 70–99)

## 2021-04-07 LAB — HEPARIN LEVEL (UNFRACTIONATED): Heparin Unfractionated: 0.27 IU/mL — ABNORMAL LOW (ref 0.30–0.70)

## 2021-04-07 MED ORDER — ENOXAPARIN SODIUM 40 MG/0.4ML IJ SOSY
40.0000 mg | PREFILLED_SYRINGE | INTRAMUSCULAR | Status: DC
  Administered 2021-04-07 – 2021-04-09 (×3): 40 mg via SUBCUTANEOUS
  Filled 2021-04-07 (×3): qty 0.4

## 2021-04-07 MED ORDER — NYSTATIN 100000 UNIT/ML MT SUSP
5.0000 mL | Freq: Four times a day (QID) | OROMUCOSAL | Status: DC
  Administered 2021-04-07 – 2021-04-10 (×12): 500000 [IU] via ORAL
  Filled 2021-04-07 (×13): qty 5

## 2021-04-07 MED ORDER — MELATONIN 3 MG PO TABS
6.0000 mg | ORAL_TABLET | Freq: Once | ORAL | Status: AC
  Administered 2021-04-07: 6 mg via ORAL
  Filled 2021-04-07: qty 2

## 2021-04-07 MED ORDER — ASPIRIN EC 81 MG PO TBEC
81.0000 mg | DELAYED_RELEASE_TABLET | Freq: Every day | ORAL | Status: DC
  Administered 2021-04-07 – 2021-04-10 (×4): 81 mg via ORAL
  Filled 2021-04-07 (×4): qty 1

## 2021-04-07 MED ORDER — MAGIC MOUTHWASH W/LIDOCAINE
5.0000 mL | Freq: Four times a day (QID) | ORAL | Status: AC
  Administered 2021-04-07 – 2021-04-08 (×8): 5 mL via ORAL
  Filled 2021-04-07 (×8): qty 5

## 2021-04-07 NOTE — Progress Notes (Signed)
CSW received notification that patient has been recommended for inpatient psychiatric treatment.  CSW spoke with Tommy Medal at [facility] who states there are beds available and she will review patient's clinicals.  Edwin Dada, MSW, LCSW Transitions of Care   Clinical Social Worker II [phone]

## 2021-04-07 NOTE — BH Assessment (Signed)
Clinician messaged Kern Alberta, RN: "Hey. It's Trey with TTS. Is the pt able to engage in the assessment, if so the pt will need to be placed in a private room. Also is the pt under IVC?"   Clinician awaiting response.    Redmond Pulling, MS, [facility], [facility] Triage Specialist [phone]

## 2021-04-07 NOTE — Progress Notes (Addendum)
°PROGRESS NOTE ° ° ° ° °Corey Rivera, is a 26 y.o. male, DOB - 01/06/1996, MRN:[id number] ° °Admit date - 04/05/2021   Admitting Physician Courage Emokpae, MD ° °Outpatient Primary MD for the patient is Pcp, No ° °LOS - 2 ° °Chief Complaint  °Patient presents with  ° Drug Overdose  °    ° ° °Brief Narrative:  °- 26 y.o. 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 °-CT head noted without acute findings °-MRA head and MRA neck w/o LVO or hemodynamically significant stenosis °-Hold off on Lipitor given elevated CKs °-Allow permissive hypertension, avoid hypotension °-Patient with history of cocaine use--??  Some component of vasospasms contributing to his stroke °-Keep patient on telemetry and watch for arrhythmias °-TSH-WNL °-LDL -30, HDL 36 total cholesterol 70 triglycerides 18 °-A1c 4.6 °-PT, OT and speech evaluation appreciated and recommends inpatient rehab/CIR--- patient's neurodeficits appears to be improving °-c/n  aspirin,  °-Patient's hearing is improving °-Echo with EF of 30 to 35% and global hypokinesis °-Improving right-sided subtle hemiparesis and sensory loss with difficulty with fine motor function especially in the right upper extremity noted °  °2) acute metabolic encephalopathy/drug overdose-- °-likely related to acute stroke and polysubstance abuse °-UDS positive for cocaine, benzodiazepine and  THC °-Patient states he used fentanyl as well--UDS did not show opiates °-The patient responded very well to Narcan °-Mentation appears back to baseline °-Patient is cooperative and polite °  °3) right leg swelling----right lower extremity venous Dopplers without acute DVT, there is evidence of soft tissue swelling °-Okay to stop IV heparin ° °4) social/ethics--- patient was admitted that [facility] from 01/25/22 thru 01/30/22 for drug withdrawal °-Subsequently went to inpatient rehab treatment for 4 weeks, he was discharged from drug rehab in mid January  °-Additional history obtained from patient and grandmother Caroline who patient stays with, as well as patient's aunt Terri Vandyke °-Patient is a full code °--Mentation appears back to baseline °-Patient is cooperative and polite °  °5)) hypertension/leukocytosis/lactic acidosis/hypothermia--query reactive due to persistent drug abuse, acute stroke, compounded by dehydration/volume depletion °--Patient met/criteria for SIRS with tachypnea, tachycardia and leukocytosis °-Continue cefepime and vancomycin pending further culture data °-Chest x-ray without acute infection °-UA is not suggestive of UTI °-WBC is down to 10.1 from 25.3 °  °6)Elevated CPK/AKI/hyperkalemia/-hyponatremia---acute kidney injury  °-On admission creatinine was 2.17, CPK 2361 °-Patient received hyperkalemia cocktail °-EKG without acute changes °-Potassium normalized °-Creatinine normalized °-Lactic acid is down to 1.3 from greater than 9 °-Continue to hydrate aggressively °CPK 2,361 >> 3,180 °- renally adjust medications, avoid nephrotoxic agents / dehydration  / hypotension °  °7) history of hep C--- LFTs noted, patient will need outpatient follow-up °  °8)Prior history of seizures in the setting of drug/opiate withdrawal--- no evidence of seizures at this time, EEG without epileptiform finding ° °9)HFrEF---Echo with EF of 30 to 35% and global hypokinesis °-???  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 °-  Patient appears to have compensated systolic CHF °-Avoid BB due to on-going cocaine use- °-Hold off on Losartan to avoid hypotension in a post CVA patient ° °10)Psych-[facility] evaluation appreciated °psychiatric team recommending inpatient psychiatric evaluation/treatment °-Patient remains medically stable and cleared for transfer to psychiatric service °  °Disposition/Need for in-Hospital Stay- patient unable to be discharged at this time due to awaiting transfer to inpatient psychiatric program ° °Status is: Inpatient °  °Remains inpatient appropriate because:  °  °Dispo: The patient is from: Home °             Anticipated d/c is to: Home °             Anticipated d/c date is: 1 days °             Patient currently is medically stable to d/c. °Barriers: -Inpatient Psych/[facility] bed availability and insurance approval °  ° Code Status :  -  Code Status: Full Code  ° °Family Communication:    grandmother Caroline who patient stays with, as well as patient's aunt Terri Vandyke ° °DVT Prophylaxis  :   -IV heparin for now ° °Lab Results  °Component Value Date  ° PLT 128 (L) 04/07/2021  ° ° °Inpatient Medications ° °Scheduled Meds: ° aspirin EC  81 mg Oral Daily  ° Chlorhexidine Gluconate Cloth  6 each Topical Q0600  ° dextromethorphan-guaiFENesin  1 tablet Oral BID  ° enoxaparin (LOVENOX) injection  40 mg Subcutaneous Q24H  ° magic mouthwash w/lidocaine  5 mL Oral QID  ° multivitamin with minerals  1 tablet Oral Daily  ° mupirocin ointment  1 application Nasal BID  ° nystatin  5 mL Oral QID  ° pantoprazole (PROTONIX) IV  40 mg Intravenous Daily  ° prochlorperazine  25 mg Rectal Once  ° sodium chloride flush  3 mL Intravenous Q12H  ° sodium chloride flush  3 mL Intravenous Q12H  ° °Continuous Infusions: ° sodium chloride    ° ceFEPime (MAXIPIME) IV 2 g (04/06/21 2356)  ° dextrose 5 % and 0.45% NaCl 75 mL/hr at 04/06/21 2124  ° °PRN Meds:.sodium chloride, acetaminophen **OR**  acetaminophen, albuterol, bisacodyl, ondansetron **OR** ondansetron (ZOFRAN) IV, polyethylene glycol, sodium chloride flush ° ° °Anti-infectives (From admission, onward)  ° ° Start     Dose/Rate Route Frequency Ordered Stop  ° 04/06/21 0200  vancomycin (VANCOREADY) IVPB 750 mg/150 mL  Status:  Discontinued       ° 750 mg °150 mL/hr over 60 Minutes Intravenous Every 12 hours 04/05/21 1218 04/06/21 1308  ° 04/06/21 0000  ceFEPIme (MAXIPIME) 2 g in sodium chloride 0.9 % 100 mL IVPB       ° 2 g °200 mL/hr over 30 Minutes Intravenous Every 12 hours 04/05/21 1213    ° 04/05/21 1215  vancomycin (VANCOREADY) IVPB 1750 mg/350 mL       ° 1,750 mg °175 mL/hr over 120 Minutes Intravenous  Once 04/05/21 1204 04/05/21 1646  ° 04/05/21 1200  ceFEPIme (MAXIPIME) 2 g in sodium chloride 0.9 % 100 mL IVPB       ° 2 g °200 mL/hr over 30 Minutes Intravenous  Once 04/05/21 1155 04/05/21 1343  ° 04/05/21 1200  metroNIDAZOLE (FLAGYL) IVPB 500 mg       ° 500 mg °100 mL/hr over 60 Minutes Intravenous  Once 04/05/21 1155 04/05/21 1343  ° 04/05/21 1200  vancomycin (VANCOCIN) IVPB 1000 mg/200 mL premix  Status:  Discontinued       °   1,000 mg 200 mL/hr over 60 Minutes Intravenous  Once 04/05/21 1155 04/05/21 1204         Subjective: Corey Rivera today has no fevers, no emesis,  No chest pain,   -Right leg swelling and pain-improving -Hearing improving -Eating and drinking okay -Very cooperative, pleasant and polite   Objective: Vitals:   04/07/21 0600 04/07/21 0700 04/07/21 0723 04/07/21 1116  BP: 134/85 120/79    Pulse: 87 84 87 93  Resp: 12 (!) 9 (!) 8 13  Temp:   98 F (36.7 C) 98 F (36.7 C)  TempSrc:   Oral Oral  SpO2: 97% 98% 98% 100%  Weight:      Height:        Intake/Output Summary (Last 24 hours) at 04/07/2021 1342 Last data filed at 04/07/2021 0800 Gross per 24 hour  Intake 3698.7 ml  Output 3100 ml  Net 598.7 ml   Filed Weights   04/05/21 1600 04/06/21 0500 04/07/21 0530  Weight: 83.6 kg 85.2 kg  83.8 kg    Physical Exam  Gen:- Awake Alert, in no apparent distress  HEENT:- [place].AT, No sclera icterus Ears--- hearing improving Neck-Supple Neck,No JVD,.  Lungs-  CTAB , fair symmetrical air movement CV- S1, S2 normal, regular  Abd-  +ve B.Sounds, Abd Soft, No tenderness,    Extremity/Skin:-Improving right calf swelling and tenderness, pedal pulses present  Psych-affect is appropriate, oriented x3, -Mentation appears back to baseline, -Patient is cooperative and polite Neuro--improving right-sided subtle hemiparesis and sensory loss with difficulty with fine motor function especially in the right upper extremity noted  Data Reviewed: I have personally reviewed following labs and imaging studies  CBC: Recent Labs  Lab 04/05/21 1112 04/06/21 0548 04/07/21 0301  WBC 25.3* 21.5* 10.1  NEUTROABS 21.5*  --   --   HGB 15.5 15.1 13.4  HCT 48.6 43.9 41.1  MCV 100.4* 97.1 100.2*  PLT 285 178 161*   Basic Metabolic Panel: Recent Labs  Lab 04/05/21 1112 04/05/21 1423 04/06/21 0548 04/07/21 0301  NA 136 134* 133* 135  K 6.2* 4.9 4.5 4.3  CL 95* 100 103 104  CO2 17* 21* 23 25  GLUCOSE 76 207* 132* 108*  BUN _0 CREATININE 2.17* 1.70* 1.16 0.96  CALCIUM 9.0 7.8* 7.8* 8.0*  PHOS  --   --   --  1.5*   GFR: Estimated Creatinine Clearance: 119.7 mL/min (by C-G formula based on SCr of 0.96 mg/dL). Liver Function Tests: Recent Labs  Lab 04/05/21 1112 04/06/21 0548 04/07/21 0301  AST 443* 1,424*  --   ALT 272* 479*  --   ALKPHOS 130* 66  --   BILITOT 1.3* 0.7  --   PROT 7.7 4.9*  --   ALBUMIN 4.6 2.9* 3.0*   Cardiac Enzymes: Recent Labs  Lab 04/05/21 1112 04/06/21 0548  CKTOTAL 2,361* 3,180*   BNP (last 3 results) No results for input(s): PROBNP in the last 8760 hours. HbA1C: Recent Labs    04/05/21 1025  HGBA1C 4.6*   Sepsis Labs: _1 (procalcitonin:4,lacticidven:4) ) Recent Results (from the past 240 hour(s))  Urine Culture     Status: None    Collection Time: 04/05/21  9:59 AM   Specimen: Urine, Catheterized  Result Value Ref Range Status   Specimen Description   Final    URINE, CATHETERIZED Performed at [facility], [address] [place], [place] [address]    Special Requests   Final    NONE Performed at  [address] [place], [place] [address]    Culture   Final    NO GROWTH Performed at [facility], [place] [address], [place], [place] [address]    Report Status 04/07/2021 FINAL  Final  Blood Culture (routine x 2)     Status: None (Preliminary result)   Collection Time: 04/05/21 11:12 AM   Specimen: BLOOD RIGHT HAND  Result Value Ref Range Status   Specimen Description BLOOD RIGHT HAND BOTTLES DRAWN AEROBIC ONLY  Final   Special Requests Blood Culture adequate volume  Final   Culture   Final    NO GROWTH 2 DAYS Performed at [facility], [address], [place], [place] [address]    Report Status PENDING  Incomplete  Blood Culture (routine x 2)     Status: None (Preliminary result)   Collection Time: 04/05/21 11:12 AM   Specimen: BLOOD LEFT HAND  Result Value Ref Range Status   Specimen Description BLOOD LEFT HAND BOTTLES DRAWN AEROBIC ONLY  Final   Special Requests Blood Culture adequate volume  Final   Culture   Final    NO GROWTH 2 DAYS Performed at [facility], [address] [place] [address] [place], [place] [address]    Report Status PENDING  Incomplete  Resp Panel by RT-PCR (Flu A&B, Covid) Nasopharyngeal Swab     Status: None   Collection Time: 04/05/21  1:44 PM   Specimen: Nasopharyngeal Swab; Nasopharyngeal(NP) swabs in vial transport medium  Result Value Ref Range Status   SARS Coronavirus 2 by RT PCR NEGATIVE NEGATIVE Final    Comment: (NOTE) SARS-CoV-2 target nucleic acids are NOT DETECTED.  The SARS-CoV-2 RNA is generally detectable in upper respiratory specimens during the acute phase of infection. The lowest concentration of SARS-CoV-2 viral copies this assay can detect is 138  copies/mL. A negative result does not preclude SARS-Cov-2 infection and should not be used as the sole basis for treatment or other patient management decisions. A negative result may occur with  improper specimen collection/handling, submission of specimen other than nasopharyngeal swab, presence of viral mutation(s) within the areas targeted by this assay, and inadequate number of viral copies(<138 copies/mL). A negative result must be combined with clinical observations, patient history, and epidemiological information. The expected result is Negative.  Fact Sheet for Patients:  EntrepreneurPulse.com.au  Fact Sheet for Healthcare Providers:  IncredibleEmployment.be  This test is no t yet approved or cleared by the [place] FDA and  has been authorized for detection and/or diagnosis of SARS-CoV-2 by FDA under an Emergency Use Authorization (EUA). This EUA will remain  in effect (meaning this test can be used) for the duration of the COVID-19 declaration under Section 564(b)(1) of the Act, 21 U.S.C.section 360bbb-3(b)(1), unless the authorization is terminated  or revoked sooner.       Influenza A by PCR NEGATIVE NEGATIVE Final   Influenza B by PCR NEGATIVE NEGATIVE Final    Comment: (NOTE) The Xpert Xpress SARS-CoV-2/FLU/RSV plus assay is intended as an aid in the diagnosis of influenza from Nasopharyngeal swab specimens and should not be used as a sole basis for treatment. Nasal washings and aspirates are unacceptable for Xpert Xpress SARS-CoV-2/FLU/RSV testing.  Fact Sheet for Patients: EntrepreneurPulse.com.au  Fact Sheet for Healthcare Providers: IncredibleEmployment.be  This test is not yet approved or cleared by the [place] FDA and has been authorized for detection and/or diagnosis of SARS-CoV-2 by FDA under an Emergency Use Authorization (EUA). This EUA will remain in effect (  meaning  this test can be used) for the duration of the °COVID-19 declaration under Section 564(b)(1) of the Act, 21 U.S.C. °section 360bbb-3(b)(1), unless the authorization is terminated or °revoked. ° °Performed at [facility], [address] [place], [place] [address] °  °MRSA Next Gen by PCR, Nasal     Status: Abnormal  ° Collection Time: 04/05/21  4:34 PM  ° Specimen: Nasal Mucosa; Nasal Swab  °Result Value Ref Range Status  ° MRSA by PCR Next Gen DETECTED (A) NOT DETECTED Final  °  Comment: RESULT CALLED TO, READ BACK BY AND VERIFIED WITH: °MANYARD,R AT 2130 ON 2.10.23 BY RUCINSKI,B °(NOTE) °The GeneXpert MRSA Assay (FDA approved for NASAL specimens only), °is one component of a comprehensive MRSA colonization surveillance °program. It is not intended to diagnose MRSA infection nor to guide °or monitor treatment for MRSA infections. °Test performance is not FDA approved in patients less than 2 years °old. °Performed at [facility], [address] [place], [place] [address] °  °  ° ° °Radiology Studies: °MR ANGIO HEAD WO CONTRAST ° °Result Date: 04/05/2021 °CLINICAL DATA:  Initial evaluation for acute stroke. EXAM: MRA NECK WITHOUT AND WITH CONTRAST MRA HEAD WITHOUT CONTRAST TECHNIQUE: Multiplanar and multiecho pulse sequences of the neck were obtained without and with intravenous contrast. Angiographic images of the neck were obtained using MRA technique without and with intravenous contrast; Angiographic images of the Circle of Willis were obtained using MRA technique without intravenous contrast. CONTRAST:  8mL GADAVIST GADOBUTROL 1 MMOL/ML IV SOLN COMPARISON:  Prior brain MRI from earlier the same day. FINDINGS: MRA NECK FINDINGS AORTIC ARCH: Visualized aortic arch normal caliber with normal branch pattern. No stenosis about the origin the great vessels. RIGHT CAROTID SYSTEM: Right common and internal carotid arteries patent without stenosis or evidence for dissection. LEFT CAROTID SYSTEM: Left common and internal  carotid arteries widely patent without stenosis or evidence for dissection. VERTEBRAL ARTERIES: Both vertebral arteries arise from the subclavian arteries. No proximal subclavian artery stenosis. Right vertebral artery slightly dominant. Vertebral arteries patent without stenosis or evidence for dissection. MRA HEAD FINDINGS ANTERIOR CIRCULATION: Both internal carotid arteries widely patent to the termini without stenosis or other abnormality. A1 segments widely patent. Normal anterior communicating artery complex. Anterior cerebral arteries patent without stenosis. No M1 stenosis or occlusion. Normal MCA bifurcations. Distal MCA branches perfused and symmetric. POSTERIOR CIRCULATION: Both vertebral arteries widely patent to the vertebrobasilar junction without stenosis. Right vertebral artery slightly dominant. Neither PICA origin well visualized. Basilar widely patent. Superior cerebral arteries patent. Both PCA supplied via the basilar as well as prominent bilateral posterior communicating arteries. Both PCAs well perfused or distal aspects without stenosis. No intracranial aneurysm. IMPRESSION: Normal MRA of the head and neck. No large vessel occlusion, hemodynamically significant stenosis, or other acute vascular abnormality. Electronically Signed   By: Benjamin  McClintock M.D.   On: 04/05/2021 19:56  ° °MR ANGIO NECK W WO CONTRAST ° °Result Date: 04/05/2021 °CLINICAL DATA:  Initial evaluation for acute stroke. EXAM: MRA NECK WITHOUT AND WITH CONTRAST MRA HEAD WITHOUT CONTRAST TECHNIQUE: Multiplanar and multiecho pulse sequences of the neck were obtained without and with intravenous contrast. Angiographic images of the neck were obtained using MRA technique without and with intravenous contrast; Angiographic images of the Circle of Willis were obtained using MRA technique without intravenous contrast. CONTRAST:  8mL GADAVIST GADOBUTROL 1 MMOL/ML IV SOLN COMPARISON:  Prior brain MRI from earlier the same day.  FINDINGS: MRA NECK FINDINGS AORTIC ARCH:   Visualized aortic arch normal caliber with normal branch pattern. No stenosis about the origin the great vessels. RIGHT CAROTID SYSTEM: Right common and internal carotid arteries patent without stenosis or evidence for dissection. LEFT CAROTID SYSTEM: Left common and internal carotid arteries widely patent without stenosis or evidence for dissection. VERTEBRAL ARTERIES: Both vertebral arteries arise from the subclavian arteries. No proximal subclavian artery stenosis. Right vertebral artery slightly dominant. Vertebral arteries patent without stenosis or evidence for dissection. MRA HEAD FINDINGS ANTERIOR CIRCULATION: Both internal carotid arteries widely patent to the termini without stenosis or other abnormality. A1 segments widely patent. Normal anterior communicating artery complex. Anterior cerebral arteries patent without stenosis. No M1 stenosis or occlusion. Normal MCA bifurcations. Distal MCA branches perfused and symmetric. POSTERIOR CIRCULATION: Both vertebral arteries widely patent to the vertebrobasilar junction without stenosis. Right vertebral artery slightly dominant. Neither PICA origin well visualized. Basilar widely patent. Superior cerebral arteries patent. Both PCA supplied via the basilar as well as prominent bilateral posterior communicating arteries. Both PCAs well perfused or distal aspects without stenosis. No intracranial aneurysm. IMPRESSION: Normal MRA of the head and neck. No large vessel occlusion, hemodynamically significant stenosis, or other acute vascular abnormality. Electronically Signed   By: Benjamin  McClintock M.D.   On: 04/05/2021 19:56  ° °MR BRAIN WO CONTRAST ° °Result Date: 04/05/2021 °CLINICAL DATA:  Provided history: Mental status change, alcohol/drug use. Additional history provided: Patient found unresponsive today. EXAM: MRI HEAD WITHOUT CONTRAST TECHNIQUE: Multiplanar, multiecho pulse sequences of the brain and surrounding  structures were obtained without intravenous contrast. COMPARISON:  Head CT 04/05/2021.  Brain MRI 01/26/2021. FINDINGS: Brain: Cerebral volume is normal. Subcentimeter acute infarct within the mid-to-inferior left cerebellar hemisphere (series 5, image 8). No cortical encephalomalacia is identified. No significant cerebral white matter disease. No evidence of an intracranial mass. No chronic intracranial blood products. No extra-axial fluid collection. No midline shift. Mega cisterna magna. Vascular: Maintained flow voids within the proximal large arterial vessels. Skull and upper cervical spine: No focal suspicious marrow lesion. Sinuses/Orbits: Visualized orbits show no acute finding. Mild mucosal thickening within the bilateral ethmoid and sphenoid sinuses. Mild-to-moderate mucosal thickening within the bilateral maxillary sinuses. IMPRESSION: Subcentimeter acute infarct within the mid-to-inferior left cerebellar hemisphere. Otherwise unremarkable non-contrast MRI appearance of the brain. Paranasal sinus disease, as described. Electronically Signed   By: Kyle  Golden D.O.   On: 04/05/2021 16:11  ° °US Venous Img Lower Bilateral (DVT) ° °Result Date: 04/07/2021 °CLINICAL DATA:  Lower extremity edema, right greater than EXAM: RIGHT LOWER EXTREMITY VENOUS DOPPLER ULTRASOUND TECHNIQUE: Gray-scale sonography with compression, as well as color and duplex ultrasound, were performed to evaluate the deep venous system(s) from the level of the common femoral vein through the popliteal and proximal calf veins. COMPARISON:  None. FINDINGS: VENOUS Normal compressibility of the common femoral, superficial femoral, and popliteal veins, as well as the visualized calf veins. Visualized portions of profunda femoral vein and great saphenous vein unremarkable. No filling defects to suggest DVT on grayscale or color Doppler imaging. Doppler waveforms show normal direction of venous flow, normal respiratory plasticity and response to  augmentation. Limited views of the contralateral common femoral vein are unremarkable. OTHER Soft tissue edema. Limitations: none IMPRESSION: 1. Negative examination for deep venous thrombosis in the right lower extremity. 2. Soft tissue edema. Electronically Signed   By: Alex D Bibbey M.D.   On: 04/07/2021 11:01  ° °DG Chest Port 1 View ° °Result Date: 04/06/2021 °CLINICAL DATA:  Shortness of breath EXAM: PORTABLE CHEST   1 VIEW COMPARISON:  04/05/2021 FINDINGS: Heart size is normal. Artifact overlies the chest. Allowing for that, the lungs appear clear. No infiltrate, collapse or effusion. No pulmonary edema. IMPRESSION: No active disease. Electronically Signed   By: Mark  Shogry M.D.   On: 04/06/2021 16:48  ° °EEG adult ° °Result Date: 04/05/2021 °Bradley, Nina G, DO     04/05/2021  8:38 PM [facility] Routine EEG Report Patient Name:   Golob, Apostolos Date of Birth:   04/18/1995 Identification Number:   MRN - [id number] Date of Study:   04/05/2021 17:41:27 Duration: 23 minutes Indication: Encephalopathy Technical Summary: A routine 20 channel electroencephalogram using the international 10-20 system of electrode placement was performed. Background: 5-6 Hz, Poorly formed States: Drowsy and Asleep - Vertex Waves, K Complexes were seen during asleep Abnormalities: - Generalized Slowing: Diffuse generalized slowing - Background Slowing: The background consists of 20-50 uV, 5-6 Hz diffuse activity with superimposed diffuse polymorphic delta activity that is non reactive to external stimulation. - When most awake this reaches 7-8 Hz. Activation Procedures: - Hyperventilation and Photic Stimulation were not performed Classification: Abnormal Clinical Correlation: - This EEG is abnormal due to mild generalized slowing. -  This is a technically limited study, throughout the recording he is asleep. - Generalized slowing is a nonspecific finding but is consistent with a  generalized disturbance of cerebral function and may be seen in toxic, metabolic, post anoxic, multifocal or diffuse structural abnormalities. - No electrographic seizures were recorded. - Clinical correlation is recommended. Dr Nina Bradley [facility] [phone] Case [id number] ° °ECHOCARDIOGRAM COMPLETE ° °Result Date: 04/06/2021 °   ECHOCARDIOGRAM REPORT   Patient Name:   Arrington Colclasure Date of Exam: 04/06/2021 Medical Rec #:  [id number]    Height:       69.0 in Accession #:    [id number]   Weight:       187.8 lb Date of Birth:  04/29/1995    BSA:          2.012 m² Patient Age:    25 years     BP:           100/69 mmHg Patient Gender: M            HR:           89 bpm. Exam Location:  [facility] Procedure: 2D Echo, Color Doppler and Cardiac Doppler Indications:     Stroke i63.9  History:         Patient has prior history of Echocardiogram examinations, most                  recent 03/25/2017. Polysubstance Abuse with OD.  Sonographer:     Emily Senior RDCS Referring Phys:  [id number] Natividad Halls Diagnosing Phys: Heather Pemberton MD IMPRESSIONS  1. Left ventricular ejection fraction, by estimation, is 30 to 35%. The left ventricle has moderately decreased function. The left ventricle demonstrates global hypokinesis. Indeterminate diastolic filling due to E-A fusion.  2. Right ventricular systolic function is mildly reduced. The right ventricular size is mildly enlarged. Tricuspid regurgitation signal is inadequate for assessing PA pressure.  3. The mitral valve is normal in structure. Trivial mitral valve regurgitation.  4. The aortic valve is tricuspid. Aortic valve regurgitation is not visualized. No aortic stenosis is present.  5. The inferior vena cava is dilated in size with <50% respiratory variability, suggesting right atrial pressure of 15 mmHg. Comparison(s): Compared to prior echo report in 2019, the EF   has dropped from 60-65% to 30-35%. FINDINGS  Left Ventricle: Left ventricular ejection fraction, by  estimation, is 30 to 35%. The left ventricle has moderately decreased function. The left ventricle demonstrates global hypokinesis. The left ventricular internal cavity size was normal in size. There is no left ventricular hypertrophy. Indeterminate diastolic filling due to E-A fusion. Right Ventricle: The right ventricular size is mildly enlarged. No increase in right ventricular wall thickness. Right ventricular systolic function is mildly reduced. Tricuspid regurgitation signal is inadequate for assessing PA pressure. Left Atrium: Left atrial size was normal in size. Right Atrium: Right atrial size was normal in size. Pericardium: There is no evidence of pericardial effusion. Mitral Valve: The mitral valve is normal in structure. Trivial mitral valve regurgitation. Tricuspid Valve: The tricuspid valve is normal in structure. Tricuspid valve regurgitation is trivial. Aortic Valve: The aortic valve is tricuspid. Aortic valve regurgitation is not visualized. No aortic stenosis is present. Pulmonic Valve: The pulmonic valve was normal in structure. Pulmonic valve regurgitation is trivial. Aorta: The aortic root is normal in size and structure. Venous: The inferior vena cava is dilated in size with less than 50% respiratory variability, suggesting right atrial pressure of 15 mmHg. IAS/Shunts: The atrial septum is grossly normal.  LEFT VENTRICLE PLAX 2D LVIDd:         5.50 cm      Diastology LVIDs:         4.50 cm      LV e' medial:    9.36 cm/s LV PW:         1.00 cm      LV E/e' medial:  4.9 LV IVS:        0.80 cm      LV e' lateral:   9.90 cm/s LVOT diam:     2.30 cm      LV E/e' lateral: 4.7 LV SV:         52 LV SV Index:   26 LVOT Area:     4.15 cm  LV Volumes (MOD) LV vol d, MOD A2C: 129.0 ml LV vol d, MOD A4C: 134.0 ml LV vol s, MOD A2C: 81.1 ml LV vol s, MOD A4C: 92.0 ml LV SV MOD A2C:     47.9 ml LV SV MOD A4C:     134.0 ml LV SV MOD BP:      50.6 ml RIGHT VENTRICLE RV S prime:     9.90 cm/s TAPSE (M-mode):  1.7 cm LEFT ATRIUM             Index        RIGHT ATRIUM           Index LA diam:        2.40 cm 1.19 cm/m   RA Area:     17.80 cm LA Vol (A2C):   28.4 ml 14.12 ml/m  RA Volume:   55.90 ml  27.79 ml/m LA Vol (A4C):   30.5 ml 15.16 ml/m LA Biplane Vol: 31.8 ml 15.81 ml/m  AORTIC VALVE LVOT Vmax:   75.70 cm/s LVOT Vmean:  57.900 cm/s LVOT VTI:    0.124 m  AORTA Ao Root diam: 3.30 cm Ao Asc diam:  2.70 cm MITRAL VALVE MV Area (PHT): 2.16 cm    SHUNTS MV Decel Time: 352 msec    Systemic VTI:  0.12 m MV E velocity: 46.30 cm/s  Systemic Diam: 2.30 cm MV A velocity: 39.80 cm/s MV E/A ratio:  1.16 Gwyndolyn Kaufman MD Electronically  signed by Gwyndolyn Kaufman MD Signature Date/Time: 04/06/2021/6:12:54 PM    Final (Updated)      Scheduled Meds:  aspirin EC  81 mg Oral Daily   Chlorhexidine Gluconate Cloth  6 each Topical Q0600   dextromethorphan-guaiFENesin  1 tablet Oral BID   enoxaparin (LOVENOX) injection  40 mg Subcutaneous Q24H   magic mouthwash w/lidocaine  5 mL Oral QID   multivitamin with minerals  1 tablet Oral Daily   mupirocin ointment  1 application Nasal BID   nystatin  5 mL Oral QID   pantoprazole (PROTONIX) IV  40 mg Intravenous Daily   prochlorperazine  25 mg Rectal Once   sodium chloride flush  3 mL Intravenous Q12H   sodium chloride flush  3 mL Intravenous Q12H   Continuous Infusions:  sodium chloride     ceFEPime (MAXIPIME) IV 2 g (04/06/21 2356)   dextrose 5 % and 0.45% NaCl 75 mL/hr at 04/06/21 2124     LOS: 2 days    Roxan Hockey M.D on 04/07/2021 at 1:42 PM  Go to [email or web address] - for contact info  [facility] - Office  [phone]  If 7PM-7AM, please contact night-coverage [email or web address] Password [facility] 04/07/2021, 1:42 PM

## 2021-04-07 NOTE — BH Assessment (Signed)
Comprehensive Clinical Assessment (CCA) Note  04/07/2021 Corey Rivera [id number]  Roselyn Bering, NP recommends pt to be observed and reassessed by psychiatry. Disposition discussed with Kern Alberta, RN.   Flowsheet Row ED to Hosp-Admission (Current) from 04/05/2021 in [facility] INTENSIVE CARE UNIT  C-SSRS RISK CATEGORY No Risk      The patient demonstrates the following risk factors for suicide: Chronic risk factors for suicide include: substance use disorder. Acute risk factors for suicide include: N/A. Protective factors for this patient include: positive social support. Considering these factors, the overall suicide risk at this point appears to be no risk. Patient is appropriate for outpatient follow up.  Corey Rivera is a 26 year old male who presents voluntary and unaccompanied to [facility]. 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 Per pt, on Thursday (04/04/2021) night he took like $20.00 of Fentanyl, he came to while in the hospital on Friday but does not remember much after that. Pt reports, "I really don't know what happened." Pt reports, he knows his friend is upset with him, finding him unconscious while dealing with the death of her sister. Pt reports, his brother died from an overdose on Fentanyl last year (04/04/2020) he found him. Pt reports, when he found his brother he has been deceased for a few hours. Pt reports, his brother was recently released from rehab before his overdose. Per pt, he has acquaintances that has overdosed on Fentanyl. Pt reports, he's accidentally overdosed 6-7 times on Fentanyl in the past four years. Pt reports, he was not trying to kill himself. Pt also denies, HI, AVH, self-injurious behaviors, access to weapons, depression/anxiety symptoms.   Pt reports, taking a couple hits of Crack  Cocaine from a friends on Thursday morning (04/04/2021.) Pt reports, taking two (blue/Xanax) bars on Thursday night (04/04/2021.) Pt reports, he smoking Marijuana about a week ago. Pt reports, he learned from staff Fentanyl was not in his system, he's not sure what he took. Pt's UDS is positive for Benzodiazepine, Cocaine and Marijuana. Pt reports, he was discharged from [facility] on 03/11/2021 after 30 days. Pt reports, he wants to get clean but does not like the rehab process. Pt reports, he was at [facility] previously. Pt denies previous inpatient psychiatric admissions.   Pt presents alert with normal speech. Pt mood, affect was pleasant. Pt's insight was lacking. Pt's judgement was poor. Pt reports, he can contract for safety if discharged.   Diagnosis: Polysubstance abuse (HCC).  *Pt reports, having supports however declined for clinician to call to obtain additional information of what occurred. Pt reports, he does not think they know.*  Chief Complaint:  Chief Complaint  Patient presents with   Drug Overdose   Visit Diagnosis:     CCA Screening, Triage and Referral (STR)  Patient Reported Information How did you hear about [place]? Other (Comment) (EMS.)  What Is the Reason for Your Visit/Call Today? Per EDP note: "Patient was found down by family.  He was having poor respirations.  He was given for Narcan by family and then paramedics arrived and given another 2 of Narcan.  Patient was still confused but breathing better."  How Long Has This Been Causing You Problems? > than 6 months  What Do You Feel Would Help You the Most Today? Alcohol or Drug Use Treatment  Have You Recently Had Any Thoughts About Hurting Yourself? No  Are You Planning to Commit Suicide/Harm Yourself At This time? No   Have you Recently Had Thoughts About Hurting Someone Karolee Ohs? No  Are You Planning to Harm Someone at This Time? No  Explanation: No data recorded  Have You Used Any Alcohol or Drugs in the  Past 24 Hours? Yes  How Long Ago Did You Use Drugs or Alcohol? No data recorded What Did You Use and How Much? Fentanyl, Crack Cocaine, and Xanax.   Do You Currently Have a Therapist/Psychiatrist? No  Name of Therapist/Psychiatrist: No data recorded  Have You Been Recently Discharged From Any Office Practice or Programs? No data recorded Explanation of Discharge From Practice/Program: No data recorded    CCA Screening Triage Referral Assessment Type of Contact: Tele-Assessment  Telemedicine Service Delivery:   Is this Initial or Reassessment? Initial Assessment  Date Telepsych consult ordered in [facility]:  04/06/21  Time Telepsych consult ordered in [facility]:  1336  Location of Assessment: [facility]  Provider Location: [facility] Assessment Services   Collateral Involvement: Pt reports, having supports however declined for clinician to call to obain additional information of what occurred. Pt reports, he does not think they know.   Does Patient Have a Automotive engineer Guardian? No data recorded Name and Contact of Legal Guardian: No data recorded If Minor and Not Living with Parent(s), Who has Custody? No data recorded Is CPS involved or ever been involved? Never  Is APS involved or ever been involved? Never   Patient Determined To Be At Risk for Harm To Self or Others Based on Review of Patient Reported Information or Presenting Complaint? Yes, for Self-Harm  Method: No data recorded Availability of Means: No data recorded Intent: No data recorded Notification Required: No data recorded Additional Information for Danger to Others Potential: No data recorded Additional Comments for Danger to Others Potential: No data recorded Are There Guns or Other Weapons in Your Home? No data recorded Types of Guns/Weapons: No data recorded Are These Weapons Safely Secured?                            No data recorded Who Could Verify You Are Able To Have These Secured: No data recorded Do  You Have any Outstanding Charges, Pending Court Dates, Parole/Probation? No data recorded Contacted To Inform of Risk of Harm To Self or Others: Other: Comment (Pt declined for clinician to contact any supports.)    Does Patient Present under Involuntary Commitment? No  IVC Papers Initial File Date: No data recorded  [place] of Residence: [place]   Patient Currently Receiving the Following Services: Not Receiving Services   Determination of Need: Urgent (48 hours)   Options For Referral: Facility-Based Crisis; Inpatient Hospitalization; Partial Hospitalization; [facility] Urgent Care; Outpatient Therapy; Medication Management; Chemical Dependency Intensive Outpatient Therapy (CDIOP)     CCA Biopsychosocial Patient Reported Schizophrenia/Schizoaffective Diagnosis in Past: No data recorded  Strengths: No data recorded  Mental Health Symptoms Depression:   -- (Pt reports, the amount of sleep depends.)   Duration of Depressive symptoms:    Mania:   None   Anxiety:    N/A   Psychosis:   None   Duration of Psychotic symptoms:    Trauma:   None   Obsessions:   None   Compulsions:   None   Inattention:   Forgetful   Hyperactivity/Impulsivity:   Feeling of  restlessness; Fidgets with hands/feet   Oppositional/Defiant Behaviors:   None   Emotional Irregularity:   None   Other Mood/Personality Symptoms:  No data recorded   Mental Status Exam Appearance and self-care  Stature:   Average   Weight:   Average weight   Clothing:   -- (Pt in hospital gown.)   Grooming:   Normal   Cosmetic use:   None   Posture/gait:   Normal   Motor activity:   Not Remarkable   Sensorium  Attention:   Normal   Concentration:   Normal   Orientation:   X5   Recall/memory:   Normal   Affect and Mood  Affect:  No data recorded  Mood:  No data recorded  Relating  Eye contact:   Normal   Facial expression:   Responsive   Attitude toward examiner:    Cooperative   Thought and Language  Speech flow:  Normal   Thought content:   Appropriate to Mood and Circumstances   Preoccupation:   None   Hallucinations:   None   Organization:  No data recorded  [employer] of Knowledge:  No data recorded  Intelligence:   Average   Abstraction:  No data recorded  Judgement:   Poor   Reality Testing:  No data recorded  Insight:   Lacking   Decision Making:   Impulsive   Social Functioning  Social Maturity:   Impulsive   Social Judgement:   Chemical engineer"; Heedless   Stress  Stressors:   Other (Comment) (Pt denies, stressors.)   Coping Ability:   Overwhelmed   Skill Deficits:   Responsibility; Decision making; Self-control   Supports:   Family     Religion: Religion/Spirituality Are You A Religious Person?:  (Pt reports, he's spiritual.)  Leisure/Recreation: Leisure / Recreation Do You Have Hobbies?: No  Exercise/Diet: Exercise/Diet Do You Exercise?: No Do You Follow a Special Diet?: No   CCA Employment/Education Employment/Work Situation: Employment / Work Situation Employment Situation: Unemployed Has Patient ever Been in Equities trader?: No  Education: Education Is Patient Currently Attending School?: No Last Grade Completed: 12 Did You Product manager?: No   CCA Family/Childhood History Family and Relationship History: Family history Marital status: Single Does patient have children?: No  Childhood History:  Childhood History Did patient suffer any verbal/emotional/physical/sexual abuse as a child?: No Did patient suffer from severe childhood neglect?: No Has patient ever been sexually abused/assaulted/raped as an adolescent or adult?: No Was the patient ever a victim of a crime or a disaster?: No Witnessed domestic violence?: No Has patient been affected by domestic violence as an adult?:  (NA)  Child/Adolescent Assessment:     CCA Substance Use Alcohol/Drug  Use: Alcohol / Drug Use Pain Medications: See MAR Prescriptions: See MAR Over the Counter: See MAR History of alcohol / drug use?: Yes Substance #1 Name of Substance 1: Crack Cocaine. 1 - Age of First Use: UTA 1 - Amount (size/oz): Pt reports, taking a couple hits from a friends. 1 - Frequency: Ongoing. 1 - Duration: Ongoing. 1 - Last Use / Amount: Per pt, Thursday morning (04/04/2021). 1 - Method of Aquiring: Pt reports, he'll use it if it's around but he does not buy it. 1- Route of Use: Inhalation. Substance #2 Name of Substance 2: Fentanyl. 2 - Age of First Use: 6 years. 2 - Amount (size/oz): Pt reports, "it wasn't a lot." 2 - Frequency: Pt reports, here and there. 2 - Duration: Ongoing.  2 - Last Use / Amount: Thursday morning (04/04/2021). 2 - Method of Aquiring: Purchase. 2 - Route of Substance Use: UTA Substance #3 Name of Substance 3: Xanax. 3 - Age of First Use: UTA 3 - Amount (size/oz): Pt reports, taking two (blue) bars. 3 - Frequency: Ongoing. 3 - Duration: Ongoing. 3 - Last Use / Amount: Per pt, Thursday night (04/04/2021). 3 - Method of Aquiring: UTA 3 - Route of Substance Use: Oral.     ASAM's:  Six Dimensions of Multidimensional Assessment  Dimension 1:  Acute Intoxication and/or Withdrawal Potential:      Dimension 2:  Biomedical Conditions and Complications:      Dimension 3:  Emotional, Behavioral, or Cognitive Conditions and Complications:     Dimension 4:  Readiness to Change:     Dimension 5:  Relapse, Continued use, or Continued Problem Potential:     Dimension 6:  Recovery/Living Environment:     ASAM Severity Score:    ASAM Recommended Level of Treatment:     Substance use Disorder (SUD)    Recommendations for Services/Supports/Treatments: Recommendations for Services/Supports/Treatments Recommendations For Services/Supports/Treatments: Other (Comment) (Pt to be observed and reassessed by psychiatry.)  Discharge Disposition:    DSM5  Diagnoses: Patient Active Problem List   Diagnosis Date Noted   OD (overdose of drug) 04/05/2021   Hepatitis C antibody test positive 04/05/2021   Acute CVA (cerebral vascular accident) (HCC) 04/05/2021   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 04/05/2021   Acute pulmonary edema (HCC) 03/24/2017   Seizure (HCC) 03/24/2017   Acute respiratory failure with hypoxia (HCC) 03/24/2017   Polysubstance abuse (HCC)    Anxiety and depression 01/24/2017   Gastroesophageal reflux disease without esophagitis 01/24/2017     Referrals to Alternative Service(s): Referred to Alternative Service(s):   Place:   Date:   Time:    Referred to Alternative Service(s):   Place:   Date:   Time:    Referred to Alternative Service(s):   Place:   Date:   Time:    Referred to Alternative Service(s):   Place:   Date:   Time:     Redmond Pulling, [facility] Comprehensive Clinical Assessment (CCA) Screening, Triage and Referral Note  04/07/2021 Corey Rivera  Chief Complaint:  Chief Complaint  Patient presents with   Drug Overdose   Visit Diagnosis:   Patient Reported Information How did you hear about [place]? Other (Comment) (EMS.)  What Is the Reason for Your Visit/Call Today? Per EDP note: "Patient was found down by family.  He was having poor respirations.  He was given for Narcan by family and then paramedics arrived and given another 2 of Narcan.  Patient was still confused but breathing better."  How Long Has This Been Causing You Problems? > than 6 months  What Do You Feel Would Help You the Most Today? Alcohol or Drug Use Treatment   Have You Recently Had Any Thoughts About Hurting Yourself? No  Are You Planning to Commit Suicide/Harm Yourself At This time? No   Have you Recently Had Thoughts About Hurting Someone Karolee Ohs? No  Are You Planning to Harm Someone at This Time? No  Explanation: No data recorded  Have You Used Any Alcohol or Drugs in the Past 24 Hours? Yes  How  Long Ago Did You Use Drugs or Alcohol? No data recorded What Did You Use and How Much? Fentanyl, Crack Cocaine, and Xanax.   Do You Currently Have a Therapist/Psychiatrist? No  Name of Therapist/Psychiatrist: No data recorded  Have You Been Recently Discharged From Any Office Practice or Programs? No data recorded Explanation of Discharge From Practice/Program: No data recorded   CCA Screening Triage Referral Assessment Type of Contact: Tele-Assessment  Telemedicine Service Delivery:   Is this Initial or Reassessment? Initial Assessment  Date Telepsych consult ordered in [facility]:  04/06/21  Time Telepsych consult ordered in [facility]:  1336  Location of Assessment: [facility] ED  Provider Location: [facility] Assessment Services   Collateral Involvement: Pt reports, having supports however declined for clinician to call to obain additional information of what occurred. Pt reports, he does not think they know.   Does Patient Have a Automotive engineer Guardian? No data recorded Name and Contact of Legal Guardian: No data recorded If Minor and Not Living with Parent(s), Who has Custody? No data recorded Is CPS involved or ever been involved? Never  Is APS involved or ever been involved? Never   Patient Determined To Be At Risk for Harm To Self or Others Based on Review of Patient Reported Information or Presenting Complaint? Yes, for Self-Harm  Method: No data recorded Availability of Means: No data recorded Intent: No data recorded Notification Required: No data recorded Additional Information for Danger to Others Potential: No data recorded Additional Comments for Danger to Others Potential: No data recorded Are There Guns or Other Weapons in Your Home? No data recorded Types of Guns/Weapons: No data recorded Are These Weapons Safely Secured?                            No data recorded Who Could Verify You Are Able To Have These Secured: No data recorded Do You Have any Outstanding  Charges, Pending Court Dates, Parole/Probation? No data recorded Contacted To Inform of Risk of Harm To Self or Others: Other: Comment (Pt declined for clinician to contact any supports.)   Does Patient Present under Involuntary Commitment? No  IVC Papers Initial File Date: No data recorded  [place] of Residence: [place]   Patient Currently Receiving the Following Services: Not Receiving Services   Determination of Need: Urgent (48 hours)   Options For Referral: Facility-Based Crisis; Inpatient Hospitalization; Partial Hospitalization; [facility] Urgent Care; Outpatient Therapy; Medication Management; Chemical Dependency Intensive Outpatient Therapy (CDIOP)   Discharge Disposition:     Redmond Pulling, [facility]         Redmond Pulling, MS, [facility], [facility] Triage Specialist [phone]

## 2021-04-07 NOTE — Progress Notes (Signed)
ANTICOAGULATION CONSULT NOTE  Pharmacy Consult for heparin gtt  Indication: rule out DVT  No Known Allergies  Patient Measurements: Height: 5' 9.02" (175.3 cm) Weight: 85.2 kg (187 lb 13.3 oz) IBW/kg (Calculated) : 70.74 Heparin Dosing Weight: HEPARIN DW (KG): 83.6   Vital Signs: Temp: 98 F (36.7 C) (02/12 0000) Temp Source: Oral (02/12 0000) BP: 115/79 (02/12 0200) Pulse Rate: 79 (02/12 0200)  Labs: Recent Labs    04/05/21 1112 04/05/21 1423 04/06/21 0548 04/06/21 1928 04/07/21 0301  HGB 15.5  --  15.1  --  13.4  HCT 48.6  --  43.9  --  41.1  PLT 285  --  178  --  128*  APTT 33  --   --   --   --   LABPROT 16.5*  --   --   --   --   INR 1.3*  --   --   --   --   HEPARINUNFRC  --   --   --  0.20* 0.27*  CREATININE 2.17* 1.70* 1.16  --  0.96  CKTOTAL 2,361*  --  3,180*  --   --      Estimated Creatinine Clearance: 129.5 mL/min (by C-G formula based on SCr of 0.96 mg/dL).   Medical History: Past Medical History:  Diagnosis Date   Allergy    Depression    Drug addiction (HCC)    GERD (gastroesophageal reflux disease)    History of chickenpox     Medications:  Medications Prior to Admission  Medication Sig Dispense Refill Last Dose   busPIRone (BUSPAR) 5 MG tablet Take 1 tablet (5 mg total) by mouth 2 (two) times daily. (Patient not taking: Reported on 04/05/2021) 60 tablet 1 Not Taking   escitalopram (LEXAPRO) 20 MG tablet Take 1 tablet (20 mg total) by mouth daily. (Patient not taking: Reported on 04/05/2021) 30 tablet 1 Not Taking   pantoprazole (PROTONIX) 40 MG tablet Take 1 tablet (40 mg total) by mouth daily. (Patient not taking: Reported on 04/05/2021) 30 tablet 3 Not Taking   Scheduled:   Chlorhexidine Gluconate Cloth  6 each Topical Q0600   dextromethorphan-guaiFENesin  1 tablet Oral BID   multivitamin with minerals  1 tablet Oral Daily   mupirocin ointment  1 application Nasal BID   pantoprazole (PROTONIX) IV  40 mg Intravenous Daily    prochlorperazine  25 mg Rectal Once   sodium chloride flush  3 mL Intravenous Q12H   sodium chloride flush  3 mL Intravenous Q12H   Infusions:   sodium chloride     ceFEPime (MAXIPIME) IV 2 g (04/06/21 2356)   dextrose 5 % and 0.45% NaCl 75 mL/hr at 04/06/21 2124   heparin 1,650 Units/hr (04/06/21 2126)   PRN: sodium chloride, acetaminophen **OR** acetaminophen, albuterol, bisacodyl, ondansetron **OR** ondansetron (ZOFRAN) IV, polyethylene glycol, sodium chloride flush Anti-infectives (From admission, onward)    Start     Dose/Rate Route Frequency Ordered Stop   04/06/21 0200  vancomycin (VANCOREADY) IVPB 750 mg/150 mL  Status:  Discontinued        750 mg 150 mL/hr over 60 Minutes Intravenous Every 12 hours 04/05/21 1218 04/06/21 1308   04/06/21 0000  ceFEPIme (MAXIPIME) 2 g in sodium chloride 0.9 % 100 mL IVPB        2 g 200 mL/hr over 30 Minutes Intravenous Every 12 hours 04/05/21 1213     04/05/21 1215  vancomycin (VANCOREADY) IVPB 1750 mg/350 mL  1,750 mg 175 mL/hr over 120 Minutes Intravenous  Once 04/05/21 1204 04/05/21 1646   04/05/21 1200  ceFEPIme (MAXIPIME) 2 g in sodium chloride 0.9 % 100 mL IVPB        2 g 200 mL/hr over 30 Minutes Intravenous  Once 04/05/21 1155 04/05/21 1343   04/05/21 1200  metroNIDAZOLE (FLAGYL) IVPB 500 mg        500 mg 100 mL/hr over 60 Minutes Intravenous  Once 04/05/21 1155 04/05/21 1343   04/05/21 1200  vancomycin (VANCOCIN) IVPB 1000 mg/200 mL premix  Status:  Discontinued        1,000 mg 200 mL/hr over 60 Minutes Intravenous  Once 04/05/21 1155 04/05/21 1204       Assessment: Corey Rivera a 26 y.o. male requires anticoagulation with a heparin iv infusion for the indication of  DVT. Heparin gtt will be started following pharmacy protocol per pharmacy consult. Patient is not on previous oral anticoagulant that will require aPTT/HL correlation before transitioning to only HL monitoring.    2/12 AM update:  Heparin level low but  trending up  Goal of Therapy:  Heparin level 0.3-0.5 units/mL in setting of stroke Monitor platelets by anticoagulation protocol: Yes   Plan:  No boluses  Increase heparin infusion to 1850 units/hr Check anti-Xa level in 6-8 hours and daily while on heparin Continue to monitor H&H and platelets  Abran Duke, PharmD, BCPS Clinical Pharmacist Phone: [phone]

## 2021-04-07 NOTE — Progress Notes (Signed)
Physical Therapy Treatment Patient Details Name: Corey Rivera MRN: [id number] DOB: 08-Feb-1996 Today's Date: 04/07/2021   History of Present Illness Corey Rivera  is a 26 y.o. 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 presents to the ED after being found unresponsive at home with respiratory distress required 4 mg of Narcan at the scene, EMS gave additional 2 mg for total of 6 milligrams of Narcan  -  Patient is now more awake, patient does admit to fentanyl use  -UDS positive for cocaine, THC and benzos but negative for opiates  -patient was admitted that [facility] from 01/25/22 thru 01/30/22 for drug withdrawal  -Subsequently went to inpatient rehab treatment for 4 weeks, he was discharged from drug rehab in mid January   -Additional history obtained from patient and grandmother Corey Rivera who patient stays with, as well as patient's aunt Corey Rivera  -  Patient reports hearing loss bilaterally as well as numbness and tingling over the right upper and lower extremity-  -No chest pains no palpitations,   -In the ED patient is found to have an AKI with a creatinine of 2.17, hyperkalemia, as well as elevated CPK  -EKG without acute findings  -CT head without acute acute findings  -MRI brain with subcentimeter acute stroke in cerebellum  -WBC was elevated in the ED lactic acidosis noted patient received IV fluids BP improved    PT Comments    Patient presents with swelling of RLE with c/o pain with pressure to right calf and end range ankle dorsiflexion of right foot ([place] negative for DVT).  Patient agreeable and motivated for therapy.  Patient demonstrates fair/good return for completing BLE ROM/strengthening exercises while seated at bedside with occasional verbal cueing, labored ataxic like gait with over compensating of right hip flexion to advance foot due to decreased right ankle  dorsiflexion resulting in poor carryover for attempting heel to toe stepping.  Patient tolerated sitting up in chair after therapy to eat lunch with his aunt present in room.  Patient will benefit from continued skilled physical therapy in hospital and recommended venue below to increase strength, balance, endurance for safe ADLs and gait.    Recommendations for follow up therapy are one component of a multi-disciplinary discharge planning process, led by the attending physician.  Recommendations may be updated based on patient status, additional functional criteria and insurance authorization.  Follow Up Recommendations  Acute inpatient rehab (3hours/day)     Assistance Recommended at Discharge    Patient can return home with the following A little help with walking and/or transfers;A little help with bathing/dressing/bathroom;Help with stairs or ramp for entrance;Assistance with cooking/housework;Assist for transportation   Equipment Recommendations  Cane    Recommendations for Other Services       Precautions / Restrictions Precautions Precautions: Fall Restrictions Weight Bearing Restrictions: No     Mobility  Bed Mobility Overal bed mobility: Modified Independent                  Transfers Overall transfer level: Needs assistance Equipment used: None, 1 person hand held assist Transfers: Sit to/from Stand, Bed to chair/wheelchair/BSC Sit to Stand: Min guard   Step pivot transfers: Min guard       General transfer comment: unsteady labored movement with mild difficulty advancing RLE due to weakness, incoordination and swelling    Ambulation/Gait Ambulation/Gait assistance: Min guard, Min assist Gait Distance (Feet): 65 Feet Assistive  device: 1 person hand held assist Gait Pattern/deviations: Decreased step length - right, Decreased stance time - right, Decreased dorsiflexion - right, Antalgic, Steppage, Ataxic Gait velocity: decreased     General Gait  Details: labored ataxic like gait with over compensating of right hip flexion to advance foot due to decreased right ankle dorsiflexion resulting in poor carryover for attempting heel to toe stepping   Stairs             Wheelchair Mobility    Modified Rankin (Stroke Patients Only)       Balance Overall balance assessment: Needs assistance Sitting-balance support: Feet supported, No upper extremity supported Sitting balance-Leahy Scale: Good Sitting balance - Comments: seated at EOB   Standing balance support: During functional activity, No upper extremity supported Standing balance-Leahy Scale: Poor Standing balance comment: fair static, fair/poor dynamic                            Cognition Arousal/Alertness: Awake/alert Behavior During Therapy: WFL for tasks assessed/performed Overall Cognitive Status: Within Functional Limits for tasks assessed                                          Exercises General Exercises - Lower Extremity Long Arc Quad: Seated, AROM, Strengthening, Both, 10 reps Hip Flexion/Marching: Seated, AROM, Strengthening, 10 reps, Both Toe Raises: Seated, AROM, Strengthening, 10 reps, Both Heel Raises: Seated, AROM, Strengthening, Both, 10 reps    General Comments        Pertinent Vitals/Pain Pain Assessment Pain Assessment: Faces Faces Pain Scale: Hurts a little bit Pain Location: RLE due to swelling Pain Descriptors / Indicators: Sore, Tightness Pain Intervention(s): Limited activity within patient's tolerance, Monitored during session, Repositioned    Home Living   Living Arrangements: Other relatives Available Help at Discharge: Family;Available 24 hours/day Type of Home: House Home Access: Level entry       Home Layout: One level        Prior Function            PT Goals (current goals can now be found in the care plan section) Acute Rehab PT Goals Patient Stated Goal: return home after  completing rehab PT Goal Formulation: With patient/family Time For Goal Achievement: 04/20/21 Potential to Achieve Goals: Good Progress towards PT goals: Progressing toward goals    Frequency    Min 5X/week      PT Plan Current plan remains appropriate    Co-evaluation              AM-PAC PT "6 Clicks" Mobility   Outcome Measure  Help needed turning from your back to your side while in a flat bed without using bedrails?: None Help needed moving from lying on your back to sitting on the side of a flat bed without using bedrails?: None Help needed moving to and from a bed to a chair (including a wheelchair)?: A Little Help needed standing up from a chair using your arms (e.g., wheelchair or bedside chair)?: A Little Help needed to walk in hospital room?: A Lot Help needed climbing 3-5 steps with a railing? : A Lot 6 Click Score: 18    End of Session   Activity Tolerance: Patient tolerated treatment well Patient left: in chair;with call bell/phone within reach;with family/visitor present Nurse Communication: Mobility status PT Visit Diagnosis: Unsteadiness on  feet (R26.81);Other abnormalities of gait and mobility (R26.89);Muscle weakness (generalized) (M62.81)     Time: [id number] PT Time Calculation (min) (ACUTE ONLY): 30 min  Charges:  $Gait Training: 8-22 mins $Therapeutic Exercise: 8-22 mins                     12:08 PM, 04/07/21 Lonell Grandchild, MPT Physical Therapist with [facility] [phone] office [phone] mobile phone

## 2021-04-07 NOTE — Progress Notes (Signed)
Inpatient Rehab Admissions:  Inpatient Rehab Consult received.  I spoke with patient on the telephone for rehabilitation assessment and to discuss goals and expectations of an inpatient rehab admission.  Pt acknowledged understanding of CIR goals and expectations. Pt interested in pursuing CIR. Pt gave permission to contact grandmother, Eber Jones. Spoke with Eber Jones on the telephone. She is supportive of pt pursuing CIR. She confirmed family will be able to provide 24/7 supervision/assistance for pt after discharge. Discussed with both pt and grandmother that pt may progress to a functional level where the intensity of CIR will not be warranted. They still would like to pursue CIR at this time. Will continue to follow.  Signed: Wolfgang Phoenix, MS, CCC-SLP Admissions Coordinator [phone]

## 2021-04-08 ENCOUNTER — Encounter (HOSPITAL_COMMUNITY): Payer: Self-pay | Admitting: Family Medicine

## 2021-04-08 DIAGNOSIS — F419 Anxiety disorder, unspecified: Secondary | ICD-10-CM | POA: Diagnosis not present

## 2021-04-08 DIAGNOSIS — F32A Depression, unspecified: Secondary | ICD-10-CM | POA: Diagnosis not present

## 2021-04-08 DIAGNOSIS — F1910 Other psychoactive substance abuse, uncomplicated: Secondary | ICD-10-CM | POA: Diagnosis not present

## 2021-04-08 LAB — CBC
HCT: 38.7 % — ABNORMAL LOW (ref 39.0–52.0)
Hemoglobin: 12.7 g/dL — ABNORMAL LOW (ref 13.0–17.0)
MCH: 31 pg (ref 26.0–34.0)
MCHC: 32.8 g/dL (ref 30.0–36.0)
MCV: 94.4 fL (ref 80.0–100.0)
Platelets: 149 10*3/uL — ABNORMAL LOW (ref 150–400)
RBC: 4.1 MIL/uL — ABNORMAL LOW (ref 4.22–5.81)
RDW: 13.5 % (ref 11.5–15.5)
WBC: 7.2 10*3/uL (ref 4.0–10.5)
nRBC: 0 % (ref 0.0–0.2)

## 2021-04-08 LAB — CREATININE, SERUM
Creatinine, Ser: 0.83 mg/dL (ref 0.61–1.24)
GFR, Estimated: >60 mL/min (ref >60)

## 2021-04-08 LAB — GLUCOSE, CAPILLARY
Glucose-Capillary: 101 mg/dL — ABNORMAL HIGH (ref 70–99)
Glucose-Capillary: 97 mg/dL (ref 70–99)
Glucose-Capillary: 114 mg/dL — ABNORMAL HIGH (ref 70–99)

## 2021-04-08 LAB — CK: Total CK: 17226 U/L — ABNORMAL HIGH (ref 49–397)

## 2021-04-08 MED ORDER — STERILE WATER FOR INJECTION IV SOLN
INTRAVENOUS | Status: DC
  Filled 2021-04-08 (×9): qty 1000

## 2021-04-08 MED ORDER — SODIUM CHLORIDE 0.9 % IV BOLUS
1000.0000 mL | Freq: Once | INTRAVENOUS | Status: AC
  Administered 2021-04-08: 1000 mL via INTRAVENOUS

## 2021-04-08 NOTE — Progress Notes (Signed)
Pt is Psych cleared 04/08/2021  Per Laveda Abbe, NP and Alan Mulder, NP pt is now psych cleared and TOC will assist with discharges needs.    Maryjean Ka, MSW, [facility] 04/08/2021 4:07 PM

## 2021-04-08 NOTE — Consult Note (Signed)
[facility] Telemedicine Consult   Requesting Provider: [place] Participants: Patient, bedside nurse Location of the provider: [place], [place] Location of the patient: [facility]  This consult was provided via telemedicine with 2-way video and audio communication. The patient/family was informed that care would be provided in this way and agreed to receive care in this manner.    Chief Complaint: Hearing loss  HPI: 26 year old male with a history of polysubstance abuse, remote history of seizures due to drug withdrawal, who was found unresponsive at home with respiratory distress.  He admits to fentanyl use, UDS was positive for cocaine.  He states that the only takes oral drugs, does not take anything injectable and has not for years.  On arrival, he had some numbness on the right side as well as hearing loss which has since resolved.    LKW: 2/10, though unclear time tpa given?: No, unclear time of onset IR Thrombectomy? No, no LVO  Exam: Vitals:   04/08/21 0800 04/08/21 1100  BP: 131/64   Pulse:    Resp: 10   Temp: 97.6 F (36.4 C) (!) 97.3 F (36.3 C)  SpO2:      General: In bed, NAD  1A: Level of Consciousness - 0 1B: Ask Month and Age - 0 1C: 'Blink Eyes' & 'Squeeze Hands' - 0 2: Test Horizontal Extraocular Movements - 0 3: Test Visual Fields - 0 4: Test Facial Palsy - 0 5A: Test Left Arm Motor Drift - 0 5B: Test Right Arm Motor Drift - 0 6A: Test Left Leg Motor Drift - 0 6B: Test Right Leg Motor Drift - 0 7: Test Limb Ataxia - 0 8: Test Sensation - 0 9: Test Language/Aphasia- 0 10: Test Dysarthria - 0 11: Test Extinction/Inattention - 0 NIHSS score: 0  I cannot test individual ears over the video connection, but hearing is intact to voice.   Imaging Reviewed: MRI brain-small left cerebellar infarct. Echo with EF 30 to 35%  Labs reviewed in epic and pertinent values follow: CK-17,000   Assessment: 26 year old male found  down in the setting of narcotic overdose as well as cocaine positivity.  My strong suspicion is that this ischemic infarct is related to cocaine use, but his improvement is reassuring.  His echo does reveal reduced ejection fraction, but not at the point where I would typically recommend anticoagulation.  Embolic stroke secondary to acute cardiomyopathy would be another consideration, but I would not change management based on this at this time.  I would start antiplatelet therapy with aspirin in the short to intermediate term, though I do not know that he needs this lifelong.  His LDL is 30 and in any case I do not have any suspicion that this is atherogenic in nature.  Recommendations:  1) aspirin 81 mg daily 2) PT, OT, ST evaluations 3) drug cessation including tobacco abuse 4) cardiomyopathy management per internal medicine   Roland Rack, MD [facility] [phone]  If 7pm- 7am, please page neurology on call as listed in [place].

## 2021-04-08 NOTE — Progress Notes (Signed)
Patient transferred from ICU this, c/o slight tingling in right foot.Patient is resting quietly with family at bedside

## 2021-04-08 NOTE — Consult Note (Signed)
Telepsych Consultation   Reason for Consult:  Psych Clearance for Polysubstance use, anxiety and depression.  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 I got real scared. I'm walking and talking good. The numbness on my right leg and right hand has returned. I feel like my self again."  HPI:  Corey Rivera is a 26 y.o. male patient admitted [facility] for acute [facility]vascular accident [facility]), poly substance abuse(HCC) with OD, acute respiratory failure with hypoxia (HCC), acute stroke related hearing loss. Patient currently lives with his grandmother. Patient seen, chart reviewed and findings discussed with team members.  On Consultation: Pt is alert and oriented X 3. Sitting up in bed with grandmother at the bedside. Pt stated that the numbness to his right leg and arm is improving and he is feeling like himself again. Sleeping up to 5 hours last night. Has good appetite. Denies SI/HI/AVH, self injury, alcohol consumption, or access to firearm. Endorsed being safe at home. Reported that he attended inpatient therapy for 4 weeks and returned mid January, 2023.  Reported he was messing around with 0.1 mg of fentanyl which made him stroked out and now he is very scared of drug use. Able to contract for safety. Patient agreed to not taking his antidepressant and  antianxiety medications for the past 3 to 4 years. Education provided on the therapeutic effects of the prescribed medications and the result of not taking medication as prescribed. Instruct to call the PCP for refill of his medications. Education provided to patient on the side effects of polysubstance use like CVA. Patient  responded that he is so scared of the substance use and will stay away from them. Intensive Outpatient Community resources for drug-use management therapy provided at discharge from the hospital.  Collateral: Grandmother at the bed side promised to take patient home upon discharge if only he promised to do right things like going to drug therapy and taking medications as ordered. Failure to do these will result in putting him away or him going to live with his aunt.  Past Psychiatric History: Anxiety, Depression, polysubstance use with OD  Risk to Self:  No Risk to Others:  No Prior Inpatient Therapy:  Yes Prior Outpatient Therapy:  Yes  Past Medical History:  Past Medical History:  Diagnosis Date   Allergy    Depression    Drug addiction (HCC)    GERD (gastroesophageal reflux disease)    History of chickenpox     Past Surgical History:  Procedure Laterality Date   NO PAST SURGERIES     Family History:  Family History  Problem Relation Age of Onset   Alcohol abuse Father    Alcohol abuse Brother    Asthma Brother    Diabetes Brother    Arthritis Maternal Grandmother    Asthma Maternal Grandmother    COPD Maternal Grandmother  Alcohol abuse Maternal Grandfather    Alcohol abuse Paternal Grandfather    Heart attack Paternal Grandfather    Hyperlipidemia Paternal Grandfather    Family Psychiatric  History: Brother died of OD one year ago. Maternal grandfather died of alcoholism and suicide.  Social History:  Social History   Substance and Sexual Activity  Alcohol Use Yes     Social History   Substance and Sexual Activity  Drug Use Yes   Types:  Cocaine, Marijuana   Comment: Roxicodone    Social History   Socioeconomic History   Marital status: Single    Spouse name: Not on file   Number of children: Not on file   Years of education: Not on file   Highest education level: Not on file  Occupational History   Not on file  Tobacco Use   Smoking status: Smoker, Current Status Unknown    Packs/day: 0.00    Types: Cigarettes   Smokeless tobacco: Never  Substance and Sexual Activity   Alcohol use: Yes   Drug use: Yes    Types: Cocaine, Marijuana    Comment: Roxicodone   Sexual activity: Yes  Other Topics Concern   Not on file  Social History Narrative   Not on file   Social Determinants of Health   Financial Resource Strain: Not on file  Food Insecurity: Not on file  Transportation Needs: Not on file  Physical Activity: Not on file  Stress: Not on file  Social Connections: Not on file   Additional Social History:   Allergies:  No Known Allergies  Labs:  Results for orders placed or performed during the hospital encounter of 04/05/21 (from the past 48 hour(s))  Heparin level (unfractionated)     Status: Abnormal   Collection Time: 04/06/21  7:28 PM  Result Value Ref Range   Heparin Unfractionated 0.20 (L) 0.30 - 0.70 IU/mL    Comment: (NOTE) The clinical reportable range upper limit is being lowered to >1.10 to align with the FDA approved guidance for the current laboratory assay.  If heparin results are below expected values, and patient dosage has  been confirmed, suggest follow up testing of antithrombin III levels. Performed at [facility], [address] [place], [place] [address]   CBC     Status: Abnormal   Collection Time: 04/07/21  3:01 AM  Result Value Ref Range   WBC 10.1 4.0 - 10.5 K/uL   RBC 4.10 (L) 4.22 - 5.81 MIL/uL   Hemoglobin 13.4 13.0 - 17.0 g/dL   HCT 17.7 93.9 - 03.0 %   MCV 100.2 (H) 80.0 - 100.0 fL   MCH 32.7 26.0 - 34.0 pg   MCHC 32.6 30.0 - 36.0 g/dL   RDW 09.2 33.0 - 07.6  %   Platelets 128 (L) 150 - 400 K/uL   nRBC 0.0 0.0 - 0.2 %    Comment: Performed at [facility], [address], [place], [place] [address]  Renal function panel     Status: Abnormal   Collection Time: 04/07/21  3:01 AM  Result Value Ref Range   Sodium 135 135 - 145 mmol/L   Potassium 4.3 3.5 - 5.1 mmol/L   Chloride 104 98 - 111 mmol/L   CO2 25 22 - 32 mmol/L   Glucose, Bld 108 (H) 70 - 99 mg/dL    Comment: Glucose reference range applies only to samples taken after fasting for at least 8 hours.   BUN 12 6 - 20 mg/dL  Creatinine, Ser 0.96 0.61 - 1.24 mg/dL   Calcium 8.0 (L) 8.9 - 10.3 mg/dL   Phosphorus 1.5 (L) 2.5 - 4.6 mg/dL   Albumin 3.0 (L) 3.5 - 5.0 g/dL   GFR, Estimated >16 >10 mL/min    Comment: (NOTE) Calculated using the CKD-EPI Creatinine Equation (2021)    Anion gap 6 5 - 15    Comment: Performed at [facility], [address] [place], [place] [address]  Heparin level (unfractionated)     Status: Abnormal   Collection Time: 04/07/21  3:01 AM  Result Value Ref Range   Heparin Unfractionated 0.27 (L) 0.30 - 0.70 IU/mL    Comment: (NOTE) The clinical reportable range upper limit is being lowered to >1.10 to align with the FDA approved guidance for the current laboratory assay.  If heparin results are below expected values, and patient dosage has  been confirmed, suggest follow up testing of antithrombin III levels. Performed at [facility], [address] [place], [place] [address]   Glucose, capillary     Status: Abnormal   Collection Time: 04/07/21 11:18 AM  Result Value Ref Range   Glucose-Capillary 104 (H) 70 - 99 mg/dL    Comment: Glucose reference range applies only to samples taken after fasting for at least 8 hours.  Glucose, capillary     Status: Abnormal   Collection Time: 04/08/21  1:01 AM  Result Value Ref Range   Glucose-Capillary 101 (H) 70 - 99 mg/dL    Comment: Glucose reference range applies only to samples taken after fasting for at least 8  hours.  Creatinine, serum     Status: None   Collection Time: 04/08/21  4:32 AM  Result Value Ref Range   Creatinine, Ser 0.83 0.61 - 1.24 mg/dL   GFR, Estimated >19 >14 mL/min    Comment: (NOTE) Calculated using the CKD-EPI Creatinine Equation (2021) Performed at [facility], [address], [place], [place] [address]   CBC     Status: Abnormal   Collection Time: 04/08/21  4:32 AM  Result Value Ref Range   WBC 7.2 4.0 - 10.5 K/uL   RBC 4.10 (L) 4.22 - 5.81 MIL/uL   Hemoglobin 12.7 (L) 13.0 - 17.0 g/dL   HCT 62.1 (L) 30.8 - 65.7 %   MCV 94.4 80.0 - 100.0 fL   MCH 31.0 26.0 - 34.0 pg   MCHC 32.8 30.0 - 36.0 g/dL   RDW 84.6 96.2 - 95.2 %   Platelets 149 (L) 150 - 400 K/uL   nRBC 0.0 0.0 - 0.2 %    Comment: Performed at [facility] [address], [place], [place] [address]  CK     Status: Abnormal   Collection Time: 04/08/21  4:32 AM  Result Value Ref Range   Total CK 17,226 (H) 49 - 397 U/L    Comment: RESULTS CONFIRMED BY MANUAL DILUTION Performed at [facility], [address], [place], [place] [address]   Glucose, capillary     Status: None   Collection Time: 04/08/21  5:10 AM  Result Value Ref Range   Glucose-Capillary 97 70 - 99 mg/dL    Comment: Glucose reference range applies only to samples taken after fasting for at least 8 hours.  Glucose, capillary     Status: Abnormal   Collection Time: 04/08/21 12:08 PM  Result Value Ref Range   Glucose-Capillary 114 (H) 70 - 99 mg/dL    Comment: Glucose reference range applies only to samples taken after fasting  for at least 8 hours.    Medications:  Current Facility-Administered Medications  Medication Dose Route Frequency Provider Last Rate Last Admin   0.9 %  sodium chloride infusion  250 mL Intravenous PRN Emokpae, Courage, MD       acetaminophen (TYLENOL) tablet 650 mg  650 mg Oral Q6H PRN Emokpae, Courage, MD       Or   acetaminophen (TYLENOL) suppository 650 mg  650 mg Rectal Q6H PRN Emokpae, Courage, MD        albuterol (PROVENTIL) (2.5 MG/3ML) 0.083% nebulizer solution 2.5 mg  2.5 mg Nebulization Q6H PRN Emokpae, Courage, MD       aspirin EC tablet 81 mg  81 mg Oral Daily Emokpae, Courage, MD   81 mg at 04/08/21 0951   bisacodyl (DULCOLAX) suppository 10 mg  10 mg Rectal Daily PRN Emokpae, Courage, MD       Chlorhexidine Gluconate Cloth 2 % PADS 6 each  6 each Topical Q0600 Emokpae, Courage, MD   6 each at 04/07/21 1030   dextromethorphan-guaiFENesin (MUCINEX DM) 30-600 MG per 12 hr tablet 1 tablet  1 tablet Oral BID Mariea Clonts, Courage, MD   1 tablet at 04/08/21 0951   enoxaparin (LOVENOX) injection 40 mg  40 mg Subcutaneous Q24H Emokpae, Courage, MD   40 mg at 04/07/21 1934   magic mouthwash w/lidocaine  5 mL Oral QID Mariea Clonts, Courage, MD   5 mL at 04/08/21 0951   multivitamin with minerals tablet 1 tablet  1 tablet Oral Daily Emokpae, Courage, MD   1 tablet at 04/08/21 0952   mupirocin ointment (BACTROBAN) 2 % 1 application  1 application Nasal BID Adefeso, Oladapo, DO   1 application at 04/08/21 0954   nystatin (MYCOSTATIN) 100000 UNIT/ML suspension 500,000 Units  5 mL Oral QID Shon Hale, MD   500,000 Units at 04/08/21 0953   ondansetron (ZOFRAN) tablet 4 mg  4 mg Oral Q6H PRN Emokpae, Courage, MD       Or   ondansetron (ZOFRAN) injection 4 mg  4 mg Intravenous Q6H PRN Emokpae, Courage, MD       pantoprazole (PROTONIX) injection 40 mg  40 mg Intravenous Daily Emokpae, Courage, MD   40 mg at 04/08/21 0951   polyethylene glycol (MIRALAX / GLYCOLAX) packet 17 g  17 g Oral Daily PRN Emokpae, Courage, MD       prochlorperazine (COMPAZINE) suppository 25 mg  25 mg Rectal Once Emokpae, Courage, MD       sodium bicarbonate 150 mEq in sterile water 1,150 mL infusion   Intravenous Continuous Mariea Clonts, Courage, MD 125 mL/hr at 04/08/21 0951 New Bag at 04/08/21 0951   sodium chloride flush (NS) 0.9 % injection 3 mL  3 mL Intravenous Q12H Emokpae, Courage, MD   3 mL at 04/07/21 2243   sodium chloride flush  (NS) 0.9 % injection 3 mL  3 mL Intravenous Q12H Emokpae, Courage, MD   3 mL at 04/07/21 2242   sodium chloride flush (NS) 0.9 % injection 3 mL  3 mL Intravenous PRN Shon Hale, MD        Musculoskeletal: Strength & Muscle Tone: Numbness to right side improving.  Gait & Station: normal  Patient leans: N/A  Psychiatric Specialty Exam:  Presentation  General Appearance: Appropriate for Environment; Casual; Fairly Groomed  Eye Contact:Good  Speech:Clear and Coherent; Normal Rate  Speech Volume:Normal  Handedness:Right  Mood and Affect  Mood:Euthymic  Affect:Appropriate  Thought Process  Thought Processes:Coherent; Linear  Descriptions of  Associations:Intact  Orientation:Full (Time, Place and Person)  Thought Content:WDL  History of Schizophrenia/Schizoaffective disorder:No data recorded  Duration of Psychotic Symptoms:No data recorded Hallucinations:Hallucinations: None  Ideas of Reference:None  Suicidal Thoughts:Suicidal Thoughts: No  Homicidal Thoughts:  Sensorium  Memory:Immediate Fair; Recent Fair; Remote Fair  Judgment:Fair  Insight:Fair  Executive Functions  Concentration:Fair  Attention Span:Good  Recall:Fair  Fund of Knowledge:Fair  Language:Good  Psychomotor Activity  Psychomotor Activity:  Assets  Assets:  Sleep  Sleep:Sleep: Fair Number of Hours of Sleep: 6  Physical Exam: Physical Exam Vitals and nursing note reviewed.  Constitutional:      Appearance: Normal appearance.  HENT:     Head: Normocephalic and atraumatic.     Right Ear: External ear normal.     Left Ear: External ear normal.     Nose: Nose normal.  Cardiovascular:     Rate and Rhythm: Normal rate.  Pulmonary:     Effort: Pulmonary effort is normal.  Abdominal:     Palpations: Abdomen is soft.  Musculoskeletal:     Cervical back: Normal range of motion and neck supple.  Skin:    General: Skin is warm.  Neurological:     Mental Status: He is  alert and oriented to person, place, and time.     Comments: Numbness to right leg and right arm, improving  Psychiatric:        Behavior: Behavior normal.   Review of Systems  Constitutional: Negative.   HENT: Negative.    Eyes: Negative.   Respiratory: Negative.    Cardiovascular:        Elevated CK of 17,226 Hgb 12.7 Plt. 149  Gastrointestinal: Negative.   Genitourinary: Negative.   Musculoskeletal:        Numbness to right side, improving  Skin: Negative.   Neurological:  Positive for weakness.       Right side weakness improving  Endo/Heme/Allergies: Negative.   Psychiatric/Behavioral:  Positive for depression and substance abuse. The patient is nervous/anxious.   Blood pressure 131/64, pulse 97, temperature (!) 97.3 F (36.3 C), temperature source Oral, resp. rate 10, height 5' 9.02" (1.753 m), weight 83.8 kg, SpO2 99 %. Body mass index is 27.27 kg/m.  Treatment Plan Summary: Daily contact with patient to assess and evaluate symptoms and progress in treatment and Medication management  Disposition: Recommended for patient to be discharged home with his Grandmother. Patient to follow up with Intensive Outpatient drug management therapy and his Therapist/Psychiatrist as recommended upon discharge from the hospital.  This service was provided via telemedicine using a 2-way, interactive audio and video technology.  Names of all persons participating in this telemedicine service and their role in this encounter. Name: [name] Role: Patient  Name: Alan Mulder Role: NP  Name: Earlene Plater Role: MD  Name:  Role:     Cecilie Lowers, FNP 04/08/2021 2:16 PM

## 2021-04-08 NOTE — Discharge Instructions (Addendum)
Please follow up with the agencies listed in your discharge paperwork. You are in need of intensive outpatient for substance abuse. Please attend narcotics anonymous for additional help with substance abuse.     IMPORTANT INFORMATION: PAY CLOSE ATTENTION   PHYSICIAN DISCHARGE INSTRUCTIONS  Follow with Primary care provider  Pcp, No  and other consultants as instructed by your Hospitalist Physician  [place] IF SYMPTOMS COME BACK, WORSEN OR NEW PROBLEM DEVELOPS   Please note: You were cared for by a hospitalist during your hospital stay. Every effort will be made to forward records to your primary care provider.  You can request that your primary care provider send for your hospital records if they have not received them.  Once you are discharged, your primary care physician will handle any further medical issues. Please note that NO REFILLS for any discharge medications will be authorized once you are discharged, as it is imperative that you return to your primary care physician (or establish a relationship with a primary care physician if you do not have one) for your post hospital discharge needs so that they can reassess your need for medications and monitor your lab values.  Please get a complete blood count and chemistry panel checked by your Primary MD at your next visit, and again as instructed by your Primary MD.  Get Medicines reviewed and adjusted: Please take all your medications with you for your next visit with your Primary MD  Laboratory/radiological data: Please request your Primary MD to go over all hospital tests and procedure/radiological results at the follow up, please ask your primary care provider to get all Hospital records sent to his/her office.  In some cases, they will be blood work, cultures and biopsy results pending at the time of your discharge. Please request that your primary care provider follow up on these results.  If  you are diabetic, please bring your blood sugar readings with you to your follow up appointment with primary care.    Please call and make your follow up appointments as soon as possible.    Also Note the following: If you experience worsening of your admission symptoms, develop shortness of breath, life threatening emergency, suicidal or homicidal thoughts you must seek medical attention immediately by calling 911 or calling your MD immediately  if symptoms less severe.  You must read complete instructions/literature along with all the possible adverse reactions/side effects for all the Medicines you take and that have been prescribed to you. Take any new Medicines after you have completely understood and accpet all the possible adverse reactions/side effects.   Do not drive when taking Pain medications or sleeping medications (Benzodiazepines)  Do not take more than prescribed Pain, Sleep and Anxiety Medications. It is not advisable to combine anxiety,sleep and pain medications without talking with your primary care practitioner  Special Instructions: If you have smoked or chewed Tobacco  in the last 2 yrs please stop smoking, stop any regular Alcohol  and or any Recreational drug use.  Wear Seat belts while driving.  Do not drive if taking any narcotic, mind altering or controlled substances or recreational drugs or alcohol.

## 2021-04-08 NOTE — TOC Initial Note (Signed)
Transition of Care [facility]) - Initial/Assessment Note    Patient Details  Name: Corey Rivera MRN: [id number] Date of Birth: Sep 11, 1995  Transition of Care [facility]) CM/SW Contact:    Boneta Lucks, RN Phone Number: 04/08/2021, 1:44 PM  Clinical Narrative: Patient admitted with overdose, Acute CVA, PT recommended CIR. CIR  waiting for patient to be Psych cleared. Patient has elevated labs today and not medically clear but improved with PT.  Awaiting another TTS, hopefully tomorrow and maybe home with outpatient PT. TOC to follow.                  Expected Discharge Plan: Home/Self Care Barriers to Discharge: Continued Medical Work up  Expected Discharge Plan and Services Expected Discharge Plan: Home/Self Care      Prior Living Arrangements/Services   Lives with:: Relatives         Activities of Daily Living Home Assistive Devices/Equipment: None ADL Screening (condition at time of admission) Patient's cognitive ability adequate to safely complete daily activities?: Yes Is the patient deaf or have difficulty hearing?: Yes Does the patient have difficulty seeing, even when wearing glasses/contacts?: No Does the patient have difficulty concentrating, remembering, or making decisions?: No Patient able to express need for assistance with ADLs?: No Does the patient have difficulty dressing or bathing?: No Independently performs ADLs?: Yes (appropriate for developmental age) Does the patient have difficulty walking or climbing stairs?: No Weakness of Legs: None Weakness of Arms/Hands: None    Admission diagnosis:  OD (overdose of drug) [T50.901A] Polysubstance abuse (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 [place] [address] Phone: [phone] Fax: [phone]

## 2021-04-08 NOTE — Progress Notes (Signed)
Physical Therapy Treatment Patient Details Name: Corey Rivera MRN: [id number] DOB: [birth year] Today's Date: 04/08/2021   History of Present Illness Corey Rivera  is a 26 y.o. 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 presents to the ED after being found unresponsive at home with respiratory distress required 4 mg of Narcan at the scene, EMS gave additional 2 mg for total of 6 milligrams of Narcan  -  Patient is now more awake, patient does admit to fentanyl use  -UDS positive for cocaine, THC and benzos but negative for opiates  -patient was admitted that [facility] from 01/25/22 thru 01/30/22 for drug withdrawal  -Subsequently went to inpatient rehab treatment for 4 weeks, he was discharged from drug rehab in mid January   -Additional history obtained from patient and grandmother Corey Rivera who patient stays with, as well as patient's aunt Corey Rivera  -  Patient reports hearing loss bilaterally as well as numbness and tingling over the right upper and lower extremity-  -No chest pains no palpitations,   -In the ED patient is found to have an AKI with a creatinine of 2.17, hyperkalemia, as well as elevated CPK  -EKG without acute findings  -CT head without acute acute findings  -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  Patient continues to have antalgic gait with RLE and occasional hiking of right hip to clear toes and no loss of balance.  Patient demonstrates good return for completing BLE ROM/strengthening exercises while seated at bedside and tolerated staying up in chair after therapy with  family member present in room.  Patient will benefit from continued skilled physical therapy in hospital and recommended venue below to increase strength, balance, endurance for safe ADLs and gait.    Recommendations for follow up therapy are one component of a multi-disciplinary discharge planning process, led by the attending physician.  Recommendations may be updated based on patient status, additional functional criteria and insurance authorization.  Follow Up Recommendations  Acute inpatient rehab (3hours/day)     Assistance Recommended at Discharge Set up Supervision/Assistance  Patient can return home with the following A little help with walking and/or transfers;A little help with bathing/dressing/bathroom;Help with stairs or ramp for entrance;Assistance with cooking/housework;Assist for transportation   Equipment Recommendations  None recommended by PT    Recommendations for Other Services       Precautions / Restrictions Precautions Precautions: Fall Restrictions Weight Bearing Restrictions: No     Mobility  Bed Mobility Overal bed mobility: Independent                  Transfers Overall transfer level: Modified independent                      Ambulation/Gait Ambulation/Gait assistance: Supervision Gait Distance (Feet): 100 Feet Assistive device: None Gait Pattern/deviations: Decreased stance time - right, Decreased stride length, Decreased step length - right, Decreased dorsiflexion - right Gait velocity: decreased     General Gait Details: increased endurance/distance with improvement for right heel to toe stepping with less hiking of right hip and less c/o of numbness RLE  Stairs             Wheelchair Mobility    Modified Rankin (Stroke Patients Only)       Balance Overall balance assessment: Needs assistance Sitting-balance support: Feet supported, No upper extremity supported Sitting balance-Leahy Scale: Good Sitting  balance - Comments: seated at EOB   Standing balance support: During functional activity, No upper extremity supported Standing balance-Leahy Scale: Fair Standing balance comment: fair/good without AD                            Cognition Arousal/Alertness: Awake/alert Behavior During Therapy: WFL for tasks assessed/performed Overall Cognitive Status: Within Functional Limits for tasks assessed                                          Exercises General Exercises - Lower Extremity Long Arc Quad: Seated, AROM, Strengthening, Both, 10 reps Hip Flexion/Marching: Seated, AROM, Strengthening, 10 reps, Both Toe Raises: Seated, AROM, Strengthening, 10 reps, Both Heel Raises: Seated, AROM, Strengthening, Both, 10 reps    General Comments        Pertinent Vitals/Pain Pain Assessment Pain Assessment: No/denies pain    Home Living Family/patient expects to be discharged to:: Private residence Living Arrangements: Other relatives Database administrator) Available Help at Discharge: Family;Available 24 hours/day Type of Home: House Home Access: Level entry       Home Layout: One level Home Equipment: Grab bars - tub/shower      Prior Function            PT Goals (current goals can now be found in the care plan section) Acute Rehab PT Goals Patient Stated Goal: Return home with family to assist PT Goal Formulation: With patient/family Time For Goal Achievement: 04/20/21 Potential to Achieve Goals: Good Progress towards PT goals: Progressing toward goals    Frequency    Min 5X/week      PT Plan Current plan remains appropriate    Co-evaluation PT/OT/SLP Co-Evaluation/Treatment: Yes Reason for Co-Treatment: To address functional/ADL transfers PT goals addressed during session: Mobility/safety with mobility;Balance OT goals addressed during session: ADL's and self-care;Proper use of Adaptive equipment and DME;Strengthening/ROM      AM-PAC PT "6  Clicks" Mobility   Outcome Measure  Help needed turning from your back to your side while in a flat bed without using bedrails?: None Help needed moving from lying on your back to sitting on the side of a flat bed without using bedrails?: None Help needed moving to and from a bed to a chair (including a wheelchair)?: None   Help needed to walk in hospital room?: A Little Help needed climbing 3-5 steps with a railing? : A Little 6 Click Rivera: 18    End of Session   Activity Tolerance: Patient tolerated treatment well;Patient limited by fatigue Patient left: in chair;with call bell/phone within reach;with family/visitor present Nurse Communication: Mobility status PT Visit Diagnosis: Unsteadiness on feet (R26.81);Other abnormalities of gait and mobility (R26.89);Muscle weakness (generalized) (M62.81)     Time: [id number] PT Time Calculation (min) (ACUTE ONLY): 20 min  Charges:  $Gait Training: 8-22 mins $Therapeutic Exercise: 8-22 mins                     12:50 PM, 04/08/21 [name], MPT Physical Therapist with [facility] [phone] office [phone] mobile  phone

## 2021-04-08 NOTE — Evaluation (Signed)
Occupational Therapy Evaluation Patient Details Name: Corey Rivera MRN: [id number] DOB: September 07, 1995 Today's Date: 04/08/2021   History of Present Illness Corey Rivera  is a 26 y.o. 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 presents to the ED after being found unresponsive at home with respiratory distress required 4 mg of Narcan at the scene, EMS gave additional 2 mg for total of 6 milligrams of Narcan  -  Patient is now more awake, patient does admit to fentanyl use  -UDS positive for cocaine, THC and benzos but negative for opiates  -patient was admitted that [facility] from 01/25/22 thru 01/30/22 for drug withdrawal  -Subsequently went to inpatient rehab treatment for 4 weeks, he was discharged from drug rehab in mid January   -Additional history obtained from patient and grandmother [name] who patient stays with, as well as patient's aunt [name]  -  Patient reports hearing loss bilaterally as well as numbness and tingling over the right upper and lower extremity-  -No chest pains no palpitations,   -In the ED patient is found to have an AKI with a creatinine of 2.17, hyperkalemia, as well as elevated CPK  -EKG without acute findings  -CT head without acute acute findings  -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 It appears that patient has made significant improvement functionally over the weekend. He presents to with edema noted in the right upper and lower extremity. Fine motor coordination is minimally decreased in the right hand which may be contributed to the edema. Education provided on performing A/ROM UE exercises to decrease edema. All education completed. Recommend  either outpatient OT if needed or to receive OT services while in Behavioral health inpatient program. I feel that he may not even need follow up OT services by the time he discharges due to his progress already. Thank you for the referral.       Recommendations for follow up therapy are one component of a multi-disciplinary discharge planning process, led by the attending physician.  Recommendations may be updated based on patient status, additional functional criteria and insurance authorization.   Follow Up Recommendations  Outpatient OT (or OT while in Behavioral Health inpatient program)    Assistance Recommended at Discharge PRN  Patient can return home with the following A little help with bathing/dressing/bathroom;A little help with walking and/or transfers;Assist for transportation;Direct supervision/assist for medications management    Functional Status Assessment  Patient has had a recent decline in their functional status and demonstrates the ability to make significant improvements in function in a reasonable and predictable amount of time.  Equipment Recommendations  None recommended by OT    Recommendations for Other Services       Precautions / Restrictions Precautions Precautions: Fall Restrictions Weight Bearing Restrictions: No      Mobility Bed Mobility Overal bed mobility: Independent      Transfers Overall transfer level: Needs assistance Equipment used: None Transfers: Sit to/from Stand Sit to Stand: Modified independent (Device/Increase time)        Balance Overall balance assessment: No apparent balance deficits (not formally assessed)           ADL either performed or assessed with clinical judgement  ADL Overall ADL's : Modified independent           General ADL Comments: Able to complete bathing, dressing toileting, and grooming at Modified Independent. May need just slightly increased time as needed.     Vision Baseline  Vision/History: 0 No visual deficits Ability to See in Adequate Light: 0 Adequate Patient Visual Report: No change from baseline Vision Assessment?: No apparent visual deficits     Perception Perception Perception: Within Functional Limits   Praxis Praxis Praxis: Intact    Pertinent Vitals/Pain Pain Assessment Pain Assessment: 0-10 Pain Score: 6  Pain Location: right leg Pain Descriptors / Indicators: Pins and needles Pain Intervention(s): Monitored during session     Hand Dominance Right   Extremity/Trunk Assessment Upper Extremity Assessment Upper Extremity Assessment: RUE deficits/detail RUE Deficits / Details: able to demonstrate full A/ROM of shoulder, elbow in all ranges. Right wrist extension slightly decreased due to RUE edema, Full and functional grasp demonstrated. MMT: shoulder, elbow: 5/5 RUE Sensation: WNL RUE Coordination: decreased fine motor (very mild decreased fine motor coordination noted.)   Lower Extremity Assessment Lower Extremity Assessment: Defer to PT evaluation   Cervical / Trunk Assessment Cervical / Trunk Assessment: Normal   Communication Communication Communication: No difficulties   Cognition Arousal/Alertness: Awake/alert Behavior During Therapy: WFL for tasks assessed/performed Overall Cognitive Status: Within Functional Limits for tasks assessed          Exercises Other Exercises Other Exercises: Education provided regarding A/ROM exercises to work on decreasing edema.        Home Living Family/patient expects to be discharged to:: Private residence Living Arrangements: Other relatives Database administrator) Available Help at Discharge: Family;Available 24 hours/day Type of Home: House Home Access: Level entry     Home Layout: One level     Bathroom Shower/Tub: Producer, television/film/video: Standard     Home Equipment: Grab bars - tub/shower      Lives With: Family    Prior Functioning/Environment Prior Level of  Function : Independent/Modified Independent             Mobility Comments: Tourist information centre manager, drives ADLs Comments: Independent        OT Problem List: Decreased coordination;Increased edema      OT Treatment/Interventions:      OT Goals(Current goals can be found in the care plan section) Acute Rehab OT Goals Patient Stated Goal: to go home  OT Frequency:      Co-evaluation PT/OT/SLP Co-Evaluation/Treatment: Yes Reason for Co-Treatment: To address functional/ADL transfers   OT goals addressed during session: ADL's and self-care;Proper use of Adaptive equipment and DME;Strengthening/ROM      AM-PAC OT "6 Clicks" Daily Activity     Outcome Measure Help from another person eating meals?: None Help from another person taking care of personal grooming?: None Help from another person toileting, which includes using toliet, bedpan, or urinal?: A Little Help from another person bathing (including washing, rinsing, drying)?: None Help from another person to put on and taking off regular upper body clothing?: None Help from another person to put on and taking off regular lower body clothing?: None 6 Click Score: 23   End of Session    Activity Tolerance: Patient tolerated treatment well Patient left: in chair;with call bell/phone within reach;with family/visitor present  OT Visit Diagnosis: Muscle weakness (generalized) (M62.81)                Time: 3220-2542 OT Time Calculation (min): 17 min Charges:  OT General  Charges $OT Visit: 1 Visit OT Evaluation $OT Eval Low Complexity: 1 Low  Limmie Patricia, OTR/L,CBIS  [phone]   Damascus Feldpausch, Charisse March 04/08/2021, 11:04 AM

## 2021-04-08 NOTE — Progress Notes (Signed)
Inpatient Rehab Admissions Coordinator:  Note pt is now supervision 66' with no assistive device. Also note OT is rx OP therapy. CIR therapy is no longer warranted. AC will sign off.  Wolfgang Phoenix, MS, CCC-SLP Admissions Coordinator [phone]

## 2021-04-08 NOTE — Progress Notes (Signed)
PROGRESS NOTE     Corey Rivera, is a 26 y.o. male, DOB - 02/15/1996, NLG:[id number]  Admit date - 04/05/2021   Admitting Physician Chistina Roston Denton Brick, MD  Outpatient Primary MD for the patient is Pcp, No  LOS - 3  Chief Complaint  Patient presents with   Drug Overdose        Brief Narrative:  - 26 y.o. 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 -CT head noted without acute findings -MRA head and MRA neck w/o LVO or hemodynamically significant stenosis -Hold off on Lipitor given elevated CKs -we Allowed permissive hypertension -Patient with history of cocaine use--??  Some component of vasospasms contributing to his stroke -Keep patient on telemetry and watch for arrhythmias -TSH-WNL -LDL -30, HDL 36 total cholesterol 70 triglycerides 18 -A1c 4.6 -PT, OT and speech evaluation appreciated--recommend outpatient rehab given improvement in stroke symptoms -- patient's neurodeficits have improved significantly -c/n  aspirin,  -Patient's hearing is much improved -Echo with EF of 30 to 35% and global hypokinesis-- -discussed with cardiology service plan for TEE on 04/09/2021 -Improving right-sided subtle hemiparesis and sensory loss with difficulty with fine motor function especially in the right upper extremity noted -If TEE is negative patient will need Zio patch at discharge   2)  acute metabolic encephalopathy/drug overdose-- -likely related to acute stroke and polysubstance abuse -UDS positive for cocaine, benzodiazepine and THC -Patient states he used fentanyl as well--UDS did not show opiates -The patient responded very well to Narcan -Mentation appears back to baseline -Patient is cooperative and polite   3) right leg swelling----right lower extremity venous Dopplers without acute DVT, there is evidence of soft tissue swelling -No need for anticoagulation -CK is rising most likely due to fall with injury to right upper extremity and right lower extremity  4) social/ethics--- patient was admitted that [facility] from 01/25/22 thru 01/30/22 for drug withdrawal -Subsequently went to inpatient rehab treatment for 4 weeks, he was discharged from drug rehab in mid January  -Additional history obtained from patient and grandmother [name] who patient stays with, as well as patient's aunt [name] -Patient is a full code --Mentation appears back to baseline -Patient is cooperative and polite   5)) hypertension/leukocytosis/lactic acidosis/hypothermia--query reactive due to persistent drug abuse, acute stroke, compounded by dehydration/volume depletion --Patient met/criteria for SIRS with tachypnea, tachycardia and leukocytosis -Continue cefepime and vancomycin pending further culture data -Chest x-ray without acute infection -UA is not suggestive of UTI -Leukocytosis resolved   6)Elevated CPK/AKI/hyperkalemia/-hyponatremia---acute kidney injury  -On admission creatinine was 2.17,  CPK 2361 -Patient received hyperkalemia cocktail -EKG without acute changes -Potassium normalized -Creatinine normalized -Lactic acid is down to 1.3 from greater than 9 CPK 2,361 >> 3,180>>17,226 --Okay to give bicarb drip - renally adjust medications, avoid nephrotoxic agents / dehydration  / hypotension   7) history of hep C--- LFTs noted, patient will  need outpatient follow-up   8)Prior history of seizures  in the setting of drug/opiate withdrawal--- no evidence of seizures at this time, EEG without epileptiform finding  9)HFrEF---Echo with EF of 30 to 35% and global hypokinesis -???  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 -Patient appears to have compensated systolic CHF -Avoid BB due to on-going cocaine use- -Hold off on Losartan to avoid hypotension in a post CVA patient --discussed with cardiology service plan for TEE on 04/09/2021  10)Psych-[facility] evaluation appreciated psychiatric team recommending inpatient psychiatric evaluation/treatment -Patient remains medically stable and cleared for transfer to psychiatric service   Disposition/Need for in-Hospital Stay- patient unable to be discharged at this time due to awaiting TEE and further work-up of cardiomyopathy Status is: Inpatient   Remains inpatient appropriate because:    Dispo: The patient is from: Home              Anticipated d/c is to: Home [facility]s [facility]              Anticipated d/c date is: 1 days              Patient currently is medically stable to d/c. Barriers: -Inpatient Psych/[facility] bed availability and insurance approval    Code Status :  -  Code Status: Full Code   Family Communication:    grandmother [name] who patient stays with, as well as patient's aunt [name]  DVT Prophylaxis  :   -IV heparin for now  Lab Results  Component Value Date   PLT 149 (L) 04/08/2021    Inpatient Medications  Scheduled Meds:  aspirin EC  81 mg Oral Daily   Chlorhexidine Gluconate Cloth  6 each Topical Q0600   dextromethorphan-guaiFENesin  1 tablet Oral BID   enoxaparin (LOVENOX) injection  40 mg Subcutaneous Q24H   magic mouthwash w/lidocaine  5 mL Oral QID   multivitamin with minerals  1 tablet Oral Daily   mupirocin ointment  1 application Nasal BID   nystatin  5 mL Oral QID   pantoprazole (PROTONIX) IV  40 mg Intravenous Daily   prochlorperazine  25 mg  Rectal Once   sodium chloride flush  3 mL Intravenous Q12H   sodium chloride flush  3 mL Intravenous Q12H   Continuous Infusions:  sodium chloride      sodium bicarbonate (isotonic) infusion in sterile water 125 mL/hr at 04/08/21 0951   PRN Meds:.sodium chloride, acetaminophen **OR** acetaminophen, albuterol, bisacodyl, ondansetron **OR** ondansetron (ZOFRAN) IV, polyethylene glycol, sodium chloride flush   Anti-infectives (From admission, onward)    Start     Dose/Rate Route Frequency Ordered Stop   04/06/21 0200  vancomycin (VANCOREADY) IVPB 750 mg/150 mL  Status:  Discontinued        750 mg 150 mL/hr over 60 Minutes Intravenous Every 12 hours 04/05/21 1218 04/06/21 1308   04/06/21 0000  ceFEPIme (MAXIPIME) 2 g in sodium chloride 0.9 % 100 mL IVPB  Status:  Discontinued        2 g 200 mL/hr over 30 Minutes Intravenous Every 12 hours 04/05/21 1213 04/08/21 0809   04/05/21 1215  vancomycin (VANCOREADY) IVPB 1750 mg/350 mL        1,750 mg 175 mL/hr over 120 Minutes Intravenous  Once 04/05/21 1204 04/05/21 1646   04/05/21 1200  ceFEPIme (MAXIPIME) 2 g in sodium chloride 0.9 % 100 mL IVPB        2 g 200 mL/hr over 30 Minutes Intravenous  Once 04/05/21 1155 04/05/21 1343   04/05/21 1200  metroNIDAZOLE (FLAGYL) IVPB 500  mg        500 mg 100 mL/hr over 60 Minutes Intravenous  Once 04/05/21 1155 04/05/21 1343   04/05/21 1200  vancomycin (VANCOCIN) IVPB 1000 mg/200 mL premix  Status:  Discontinued        1,000 mg 200 mL/hr over 60 Minutes Intravenous  Once 04/05/21 1155 04/05/21 1204         Subjective: Nicky Pugh today has no fevers, no emesis,  No chest pain,   -Right leg swelling and pain-improving -Hearing improving -Eating and drinking okay -No new concerns -Grandmother at bedside -Very cooperative, pleasant and polite   Objective: Vitals:   04/08/21 0200 04/08/21 0400 04/08/21 0800 04/08/21 1100  BP: (!) 138/52 119/68 131/64   Pulse:      Resp: 19 20 10    Temp:  98.4 F (36.9 C)  97.6 F (36.4 C) (!) 97.3 F (36.3 C)  TempSrc: Oral  Oral Oral  SpO2:      Weight:      Height:        Intake/Output Summary (Last 24 hours) at 04/08/2021 1555 Last data filed at 04/08/2021 1312 Gross per 24 hour  Intake 1971.75 ml  Output 6275 ml  Net -4303.25 ml   Filed Weights   04/05/21 1600 04/06/21 0500 04/07/21 0530  Weight: 83.6 kg 85.2 kg 83.8 kg    Physical Exam  Gen:- Awake Alert, in no apparent distress  HEENT:- [place].AT, No sclera icterus Ears--- hearing improving Neck-Supple Neck,No JVD,.  Lungs-  CTAB , fair symmetrical air movement CV- S1, S2 normal, regular  Abd-  +ve B.Sounds, Abd Soft, No tenderness,    Extremity/Skin:-Improving right calf swelling and tenderness, pedal pulses present  Psych-affect is appropriate, oriented x3, -Mentation appears back to baseline, -Patient is cooperative and polite Neuro--improving right-sided subtle hemiparesis and sensory loss with difficulty with fine motor function especially in the right upper extremity noted  Data Reviewed: I have personally reviewed following labs and imaging studies  CBC: Recent Labs  Lab 04/05/21 1112 04/06/21 0548 04/07/21 0301 04/08/21 0432  WBC 25.3* 21.5* 10.1 7.2  NEUTROABS 21.5*  --   --   --   HGB 15.5 15.1 13.4 12.7*  HCT 48.6 43.9 41.1 38.7*  MCV 100.4* 97.1 100.2* 94.4  PLT 285 178 128* 371*   Basic Metabolic Panel: Recent Labs  Lab 04/05/21 1112 04/05/21 1423 04/06/21 0548 04/07/21 0301 04/08/21 0432  NA 136 134* 133* 135  --   K 6.2* 4.9 4.5 4.3  --   CL 95* 100 103 104  --   CO2 17* 21* 23 25  --   GLUCOSE 76 207* 132* 108*  --   BUN 8 12 19 12   --   CREATININE 2.17* 1.70* 1.16 0.96 0.83  CALCIUM 9.0 7.8* 7.8* 8.0*  --   PHOS  --   --   --  1.5*  --    GFR: Estimated Creatinine Clearance: 138.4 mL/min (by C-G formula based on SCr of 0.83 mg/dL). Liver Function Tests: Recent Labs  Lab 04/05/21 1112 04/06/21 0548 04/07/21 0301  AST 443*  1,424*  --   ALT 272* 479*  --   ALKPHOS 130* 66  --   BILITOT 1.3* 0.7  --   PROT 7.7 4.9*  --   ALBUMIN 4.6 2.9* 3.0*   Cardiac Enzymes: Recent Labs  Lab 04/05/21 1112 04/06/21 0548 04/08/21 0432  CKTOTAL 2,361* 3,180* 17,226*   BNP (last 3 results) No results for input(s): PROBNP  in the last 8760 hours. HbA1C: No results for input(s): HGBA1C in the last 72 hours.  Sepsis Labs: $RemoveBefo'@[email or web address]'aFhYIlHEsLr$ (procalcitonin:4,lacticidven:4) ) Recent Results (from the past 240 hour(s))  Urine Culture     Status: None   Collection Time: 04/05/21  9:59 AM   Specimen: Urine, Catheterized  Result Value Ref Range Status   Specimen Description   Final    URINE, CATHETERIZED Performed at [facility], [address] [place], [place] [address]    Special Requests   Final    NONE Performed at [facility], [address] [place], [place] [address]    Culture   Final    NO GROWTH Performed at [facility], [place] [address] [place], [place] [address]    Report Status 04/07/2021 FINAL  Final  Blood Culture (routine x 2)     Status: None (Preliminary result)   Collection Time: 04/05/21 11:12 AM   Specimen: BLOOD RIGHT HAND  Result Value Ref Range Status   Specimen Description BLOOD RIGHT HAND BOTTLES DRAWN AEROBIC ONLY  Final   Special Requests Blood Culture adequate volume  Final   Culture   Final    NO GROWTH 3 DAYS Performed at [facility], [address] [place], [place] [address]    Report Status PENDING  Incomplete  Blood Culture (routine x 2)     Status: None (Preliminary result)   Collection Time: 04/05/21 11:12 AM   Specimen: BLOOD LEFT HAND  Result Value Ref Range Status   Specimen Description BLOOD LEFT HAND BOTTLES DRAWN AEROBIC ONLY  Final   Special Requests Blood Culture adequate volume  Final   Culture   Final    NO GROWTH 3 DAYS Performed at [facility], [address], [place], [place] [address]    Report Status PENDING  Incomplete  Resp Panel by RT-PCR (Flu A&B,  Covid) Nasopharyngeal Swab     Status: None   Collection Time: 04/05/21  1:44 PM   Specimen: Nasopharyngeal Swab; Nasopharyngeal(NP) swabs in vial transport medium  Result Value Ref Range Status   SARS Coronavirus 2 by RT PCR NEGATIVE NEGATIVE Final    Comment: (NOTE) SARS-CoV-2 target nucleic acids are NOT DETECTED.  The SARS-CoV-2 RNA is generally detectable in upper respiratory specimens during the acute phase of infection. The lowest concentration of SARS-CoV-2 viral copies this assay can detect is 138 copies/mL. A negative result does not preclude SARS-Cov-2 infection and should not be used as the sole basis for treatment or other patient management decisions. A negative result may occur with  improper specimen collection/handling, submission of specimen other than nasopharyngeal swab, presence of viral mutation(s) within the areas targeted by this assay, and inadequate number of viral copies(<138 copies/mL). A negative result must be combined with clinical observations, patient history, and epidemiological information. The expected result is Negative.  Fact Sheet for Patients:  EntrepreneurPulse.com.au  Fact Sheet for Healthcare Providers:  IncredibleEmployment.be  This test is no t yet approved or cleared by the [place] FDA and  has been authorized for detection and/or diagnosis of SARS-CoV-2 by FDA under an Emergency Use Authorization (EUA). This EUA will remain  in effect (meaning this test can be used) for the duration of the COVID-19 declaration under Section 564(b)(1) of the Act, 21 U.S.C.section 360bbb-3(b)(1), unless the authorization is terminated  or revoked sooner.       Influenza A by PCR NEGATIVE NEGATIVE Final   Influenza B by PCR NEGATIVE NEGATIVE Final    Comment: (NOTE)  The Xpert Xpress SARS-CoV-2/FLU/RSV plus assay is intended as an aid in the diagnosis of influenza from Nasopharyngeal swab specimens and should  not be used as a sole basis for treatment. Nasal washings and aspirates are unacceptable for Xpert Xpress SARS-CoV-2/FLU/RSV testing.  Fact Sheet for Patients: EntrepreneurPulse.com.au  Fact Sheet for Healthcare Providers: IncredibleEmployment.be  This test is not yet approved or cleared by the [place] FDA and has been authorized for detection and/or diagnosis of SARS-CoV-2 by FDA under an Emergency Use Authorization (EUA). This EUA will remain in effect (meaning this test can be used) for the duration of the COVID-19 declaration under Section 564(b)(1) of the Act, 21 U.S.C. section 360bbb-3(b)(1), unless the authorization is terminated or revoked.  Performed at [facility], [address] [place], [place] [address]   MRSA Next Gen by PCR, Nasal     Status: Abnormal   Collection Time: 04/05/21  4:34 PM   Specimen: Nasal Mucosa; Nasal Swab  Result Value Ref Range Status   MRSA by PCR Next Gen DETECTED (A) NOT DETECTED Final    Comment: RESULT CALLED TO, READ BACK BY AND VERIFIED WITH: MANYARD,R AT 2130 ON 2.10.23 BY RUCINSKI,B (NOTE) The GeneXpert MRSA Assay (FDA approved for NASAL specimens only), is one component of a comprehensive MRSA colonization surveillance program. It is not intended to diagnose MRSA infection nor to guide or monitor treatment for MRSA infections. Test performance is not FDA approved in patients less than 62 years old. Performed at [facility], [address] [place], [place] [address]       Radiology Studies: DG Chest 2 View  Result Date: 04/07/2021 CLINICAL DATA:  Dyspnea EXAM: CHEST - 2 VIEW COMPARISON:  04/06/2021 FINDINGS: Two views study shows tiny bilateral pleural effusions, right greater than left. No edema or focal airspace consolidation. The cardiopericardial silhouette is within normal limits for size. The visualized bony structures of the thorax show no acute abnormality. Telemetry leads overlie  the chest. IMPRESSION: Tiny bilateral pleural effusions, right greater than left. Electronically Signed   By: Misty Stanley M.D.   On: 04/07/2021 13:43   US Venous Img Lower Bilateral (DVT)  Result Date: 04/07/2021 CLINICAL DATA:  Lower extremity edema, right greater than EXAM: RIGHT LOWER EXTREMITY VENOUS DOPPLER ULTRASOUND TECHNIQUE: Gray-scale sonography with compression, as well as color and duplex ultrasound, were performed to evaluate the deep venous system(s) from the level of the common femoral vein through the popliteal and proximal calf veins. COMPARISON:  None. FINDINGS: VENOUS Normal compressibility of the common femoral, superficial femoral, and popliteal veins, as well as the visualized calf veins. Visualized portions of profunda femoral vein and great saphenous vein unremarkable. No filling defects to suggest DVT on grayscale or color Doppler imaging. Doppler waveforms show normal direction of venous flow, normal respiratory plasticity and response to augmentation. Limited views of the contralateral common femoral vein are unremarkable. OTHER Soft tissue edema. Limitations: none IMPRESSION: 1. Negative examination for deep venous thrombosis in the right lower extremity. 2. Soft tissue edema. Electronically Signed   By: Delanna Ahmadi M.D.   On: 04/07/2021 11:01     Scheduled Meds:  aspirin EC  81 mg Oral Daily   Chlorhexidine Gluconate Cloth  6 each Topical Q0600   dextromethorphan-guaiFENesin  1 tablet Oral BID   enoxaparin (LOVENOX) injection  40 mg Subcutaneous Q24H   magic mouthwash w/lidocaine  5 mL Oral QID   multivitamin with minerals  1 tablet Oral Daily   mupirocin ointment  1  application Nasal BID   nystatin  5 mL Oral QID   pantoprazole (PROTONIX) IV  40 mg Intravenous Daily   prochlorperazine  25 mg Rectal Once   sodium chloride flush  3 mL Intravenous Q12H   sodium chloride flush  3 mL Intravenous Q12H   Continuous Infusions:  sodium chloride      sodium bicarbonate  (isotonic) infusion in sterile water 125 mL/hr at 04/08/21 0951     LOS: 3 days    Roxan Hockey M.D on 04/08/2021 at 3:55 PM  Go to [email or web address] - for contact info  [facility] - Office  [phone]  If 7PM-7AM, please contact night-coverage [email or web address] Password [facility] 04/08/2021, 3:55 PM

## 2021-04-09 ENCOUNTER — Inpatient Hospital Stay (HOSPITAL_COMMUNITY): Payer: BC Managed Care – PPO

## 2021-04-09 ENCOUNTER — Inpatient Hospital Stay (HOSPITAL_COMMUNITY): Payer: BC Managed Care – PPO | Admitting: Certified Registered Nurse Anesthetist

## 2021-04-09 ENCOUNTER — Other Ambulatory Visit: Payer: Self-pay

## 2021-04-09 ENCOUNTER — Encounter (HOSPITAL_COMMUNITY)
Admission: EM | Disposition: A | Discharge status: Home / Self Care | Payer: Self-pay | Source: Home / Self Care | Attending: Family Medicine

## 2021-04-09 ENCOUNTER — Encounter (HOSPITAL_COMMUNITY): Payer: Self-pay | Admitting: Family Medicine

## 2021-04-09 DIAGNOSIS — I6389 Other cerebral infarction: Secondary | ICD-10-CM

## 2021-04-09 HISTORY — PX: TEE WITHOUT CARDIOVERSION: SHX5443

## 2021-04-09 LAB — COMPREHENSIVE METABOLIC PANEL
ALT: 448 U/L — ABNORMAL HIGH (ref 0–44)
AST: 962 U/L — ABNORMAL HIGH (ref 15–41)
Albumin: 3.2 g/dL — ABNORMAL LOW (ref 3.5–5.0)
Alkaline Phosphatase: 58 U/L (ref 38–126)
Anion gap: 13 (ref 5–15)
BUN: 8 mg/dL (ref 6–20)
CO2: 27 mmol/L (ref 22–32)
Calcium: 8.5 mg/dL — ABNORMAL LOW (ref 8.9–10.3)
Chloride: 98 mmol/L (ref 98–111)
Creatinine, Ser: 0.9 mg/dL (ref 0.61–1.24)
GFR, Estimated: >60 mL/min (ref >60)
Glucose, Bld: 96 mg/dL (ref 70–99)
Potassium: 3.9 mmol/L (ref 3.5–5.1)
Sodium: 138 mmol/L (ref 135–145)
Total Bilirubin: 0.9 mg/dL (ref 0.3–1.2)
Total Protein: 5.5 g/dL — ABNORMAL LOW (ref 6.5–8.1)

## 2021-04-09 LAB — CBC
HCT: 45 % (ref 39.0–52.0)
Hemoglobin: 15 g/dL (ref 13.0–17.0)
MCH: 31.1 pg (ref 26.0–34.0)
MCHC: 33.3 g/dL (ref 30.0–36.0)
MCV: 93.2 fL (ref 80.0–100.0)
Platelets: 173 10*3/uL (ref 150–400)
RBC: 4.83 MIL/uL (ref 4.22–5.81)
RDW: 13.2 % (ref 11.5–15.5)
WBC: 9 10*3/uL (ref 4.0–10.5)
nRBC: 0 % (ref 0.0–0.2)

## 2021-04-09 LAB — GLUCOSE, CAPILLARY
Glucose-Capillary: 104 mg/dL — ABNORMAL HIGH (ref 70–99)
Glucose-Capillary: 111 mg/dL — ABNORMAL HIGH (ref 70–99)
Glucose-Capillary: 86 mg/dL (ref 70–99)

## 2021-04-09 LAB — CK
Total CK: 11066 U/L — ABNORMAL HIGH (ref 49–397)
Total CK: 7926 U/L — ABNORMAL HIGH (ref 49–397)

## 2021-04-09 SURGERY — ECHOCARDIOGRAM, TRANSESOPHAGEAL
Anesthesia: General

## 2021-04-09 MED ORDER — CHLORHEXIDINE GLUCONATE 0.12 % MT SOLN
OROMUCOSAL | Status: AC
  Filled 2021-04-09: qty 15

## 2021-04-09 MED ORDER — SUCCINYLCHOLINE CHLORIDE 200 MG/10ML IV SOSY
PREFILLED_SYRINGE | INTRAVENOUS | Status: AC
  Filled 2021-04-09: qty 10

## 2021-04-09 MED ORDER — PHENYLEPHRINE 40 MCG/ML (10ML) SYRINGE FOR IV PUSH (FOR BLOOD PRESSURE SUPPORT)
PREFILLED_SYRINGE | INTRAVENOUS | Status: AC
  Filled 2021-04-09: qty 10

## 2021-04-09 MED ORDER — MIDAZOLAM HCL 2 MG/2ML IJ SOLN
INTRAMUSCULAR | Status: AC
  Filled 2021-04-09: qty 2

## 2021-04-09 MED ORDER — DEXMEDETOMIDINE (PRECEDEX) IN NS 20 MCG/5ML (4 MCG/ML) IV SYRINGE
PREFILLED_SYRINGE | INTRAVENOUS | Status: AC
  Filled 2021-04-09: qty 5

## 2021-04-09 MED ORDER — FENTANYL CITRATE (PF) 100 MCG/2ML IJ SOLN
INTRAMUSCULAR | Status: AC
  Filled 2021-04-09: qty 2

## 2021-04-09 MED ORDER — SODIUM CHLORIDE 0.9 % IV BOLUS
1000.0000 mL | Freq: Once | INTRAVENOUS | Status: AC
  Administered 2021-04-09: 1000 mL via INTRAVENOUS

## 2021-04-09 MED ORDER — FENTANYL CITRATE (PF) 100 MCG/2ML IJ SOLN
INTRAMUSCULAR | Status: DC | PRN
  Administered 2021-04-09: 100 ug via INTRAVENOUS

## 2021-04-09 MED ORDER — LIDOCAINE HCL (CARDIAC) PF 100 MG/5ML IV SOSY
PREFILLED_SYRINGE | INTRAVENOUS | Status: DC | PRN
  Administered 2021-04-09: 50 mg via INTRAVENOUS

## 2021-04-09 MED ORDER — LACTATED RINGERS IV SOLN: INTRAVENOUS | Status: DC

## 2021-04-09 MED ORDER — MIDAZOLAM HCL 2 MG/2ML IJ SOLN
INTRAMUSCULAR | Status: DC | PRN
  Administered 2021-04-09: 2 mg via INTRAVENOUS

## 2021-04-09 MED ORDER — FUROSEMIDE 10 MG/ML IJ SOLN: 40.0000 mg | Freq: Once | INTRAMUSCULAR | Status: DC

## 2021-04-09 MED ORDER — LIDOCAINE HCL (PF) 2 % IJ SOLN
INTRAMUSCULAR | Status: AC
  Filled 2021-04-09: qty 5

## 2021-04-09 MED ORDER — PROPOFOL 10 MG/ML IV BOLUS
INTRAVENOUS | Status: AC
  Filled 2021-04-09: qty 20

## 2021-04-09 MED ORDER — PROPOFOL 10 MG/ML IV BOLUS
INTRAVENOUS | Status: DC | PRN
  Administered 2021-04-09: 100 mg via INTRAVENOUS
  Administered 2021-04-09 (×2): 50 mg via INTRAVENOUS
  Administered 2021-04-09: 100 mg via INTRAVENOUS
  Administered 2021-04-09 (×2): 50 mg via INTRAVENOUS
  Administered 2021-04-09 (×2): 100 mg via INTRAVENOUS

## 2021-04-09 MED ORDER — DEXMEDETOMIDINE (PRECEDEX) IN NS 20 MCG/5ML (4 MCG/ML) IV SYRINGE
PREFILLED_SYRINGE | INTRAVENOUS | Status: DC | PRN
  Administered 2021-04-09: 20 ug via INTRAVENOUS

## 2021-04-09 MED ORDER — ORAL CARE MOUTH RINSE: 15.0000 mL | Freq: Once | OROMUCOSAL | Status: AC

## 2021-04-09 MED ORDER — CHLORHEXIDINE GLUCONATE 0.12 % MT SOLN
15.0000 mL | Freq: Once | OROMUCOSAL | Status: AC
  Administered 2021-04-09: 15 mL via OROMUCOSAL

## 2021-04-09 NOTE — Progress Notes (Signed)
*  PRELIMINARY RESULTS* Echocardiogram Echocardiogram Transesophageal has been performed.  Corey Rivera 04/09/2021, 2:32 PM

## 2021-04-09 NOTE — Progress Notes (Signed)
Patient Information  Patient Name  Sukhman, Martine ([id number]) Legal Sex  Male DOB  [birth year]  Room Bed  A339 A339-01    Patient Demographics  Address  [address]  [place] [address] Phone  [phone] [facility])  [phone] (Mobile)    Basic Information  Date Of Birth  [birth year] Gender Identity  Male Race  White or Caucasian Ethnic Group  Not Hispanic or Latino Preferred Language  English    Patient Contacts  Name Relation Home Work Mobile  Dalton,Carolyn Grandmother   [phone]  Aniceto Boss   [phone]  Daison, Braxton Father  [phone]     Documents on File   Status Date Received Description  Documents for the Patient  [place] HIPAA NOTICE OF PRIVACY - Scanned Not Received    [facility] E-Signature HIPAA Notice of Privacy Signed 01/23/17   Driver's License Received 01/23/17 NCDL 2025  Insurance Card Received 02/25/17 [employer] 2018  Advance Directives/Living Will/HCPOA/POA Not Received    Other Photo ID Not Received    Release of Information Received 01/23/17 [facility] Wide DPR Signed 01/23/17  HIM ROI Authorization Received 11/23/18 [facility]  Insurance Card Received 04/05/21 bcbs  HIM Release of Information Output  11/23/18 Requested records  HIM Release of Information Output  11/23/18   Documents for the Encounter  AOB  (Assignment of Insurance Benefits) Not Received    E-signature AOB Signed 04/05/21   MEDICARE RIGHTS Not Received    E-signature Medicare Rights     Annual Exam - E-Signature PCP     Disclosure No Surprises Act Received 04/05/21   ED Patient Billing Extract   ED PB Billing Extract  Cardiac Monitoring Strip Received 04/08/21   Cardiac Monitoring Strip Shift Summary Received 04/08/21   Echocardiogram Complete Received 04/06/21   EKG Received 04/08/21     Admission Information   Current Information  Attending Provider Admitting Provider Admission Type Admission Status  Shon Hale, MD Shon Hale, MD Emergency Confirmed Admission        Admission Date/Time Discharge Date Hospital Service Auth/Cert Status  04/05/21  09:53 AM  Acute Care Incomplete        Hospital Area Unit Room/Bed   [facility] AP-DEPT 300 E092/Z300-76             Admission  Complaint  drug overdose    Hospital Account  Name Acct ID Class Status Primary Coverage  Pamela, Maddy [id number] Inpatient Open BLUE CROSS BLUE SHIELD - BCBS COMM PPO        Guarantor Account (for Hospital Account [id number])  Name Relation to Pt Service Area Active? Acct Type  [name] Self [facility] Yes Personal/Family  Address Phone    [address]  [place], [place] [address] [phone](H)          Coverage Information (for Hospital Account [id number])  F/O Payor/Plan Precert #  BLUE CROSS BLUE SHIELD/BCBS COMM PPO   Subscriber Subscriber #  Mclane, Arora [id number]  Address Phone  [address]  [place], [place] [address] [phone]         Care Everywhere ID:  [id number]

## 2021-04-09 NOTE — Transfer of Care (Signed)
Immediate Anesthesia Transfer of Care Note  Patient: Corey Rivera  Procedure(s) Performed: TRANSESOPHAGEAL ECHOCARDIOGRAM (TEE)  Patient Location: PACU  Anesthesia Type:General  Level of Consciousness: drowsy  Airway & Oxygen Therapy: Patient Spontanous Breathing and Patient connected to nasal cannula oxygen  Post-op Assessment: Report given to RN, Post -op Vital signs reviewed and stable and Patient moving all extremities X 4  Post vital signs: Reviewed and stable  Last Vitals:  Vitals Value Taken Time  BP    Temp    Pulse    Resp    SpO2      Last Pain:  Vitals:   04/09/21 1221  TempSrc: Oral  PainSc: 0-No pain      Patients Stated Pain Goal: 7 ([id number])  Complications: No notable events documented.

## 2021-04-09 NOTE — H&P (Signed)
Procedure H&P   Patient has been referred for TEE in setting of stroke. Please see referenced hospital H&P below for full history. Will plan for TEE today with the assistance of anesthesiology   Carlyle Dolly MD   Patient Demographics:      Corey Rivera, is a 26 y.o. male  MRN: [id number]   DOB - Sep 14, 1995   Admit Date - 04/05/2021   Outpatient Primary MD for the patient is Pcp, No    Assessment & Plan:    Assessment and Plan: No notes have been filed under this hospital service. 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 -CT head noted without acute findings -MRA head and MRA neck pending -Give aspirin,  -Hold off on Lipitor given elevated CKs -Allow permissive hypertension, avoid hypotension -Patient with history of cocaine use--??  Some component of vasospasms -Keep patient on telemetry -Get echo -TSH, A1c, lipid profile for further risk stratification -PT, OT and speech requested   2) acute metabolic encephalopathy/drug overdose-- -likely related to acute stroke and polysubstance abuse -UDS positive for cocaine, benzodiazepine and THC -Patient states he used fentanyl as well--UDS did not show opiates -The patient responded very well to Narcan   3) social/ethics--- patient was admitted that [facility] from 01/25/22 thru 01/30/22 for drug withdrawal -Subsequently went to inpatient rehab treatment for 4 weeks, he was discharged from drug rehab in mid January  -Additional history obtained from patient and grandmother [name] who patient stays with, as well as patient's aunt [name] -Patient is a full code   4) hypertension/leukocytosis/lactic acidosis/hypothermia--query reactive due to persistent drug abuse, acute stroke, compounded by dehydration/volume depletion --Patient met/criteria with tachypnea, tachycardia and leukocytosis -Continue cefepime and vancomycin pending further culture data -Chest x-ray without acute infection -UA is not suggestive of UTI   5)Elevated CPK/AKI/hyperkalemia----acute kidney injury  -On admission creatinine was 2.17, CPK 2361 -Patient received hyperkalemia cocktail -EKG without acute changes -Continue to hydrate aggressively  renally adjust medications, avoid nephrotoxic agents / dehydration  / hypotension   6) history of hep C--- LFTs noted, patient will need outpatient follow-up   7) prior history of seizures in the setting of drug/opiate withdrawal--- no evidence of seizures at this time, EEG pending   Disposition/Need for in-Hospital Stay- patient unable to be discharged at this time due to acute stroke requiring further interventions/work-up SIRS physiology requiring aggressive IV fluids and IV antibiotics pending further culture data   Status is: Inpatient   Remains inpatient appropriate because:    Dispo: The patient is from: Home              Anticipated d/c is to: Home              Anticipated d/c date is: 3 days              Patient currently is not medically stable to d/c. Barriers: Not Clinically Stable-      With History of - Reviewed by me       Past Medical History:  Diagnosis Date   Allergy     Depression     Drug  addiction ([place])     GERD (gastroesophageal reflux disease)     History of chickenpox              Past Surgical History:  Procedure Laterality Date   NO PAST SURGERIES                 Chief Complaint  Patient presents with   Drug Overdose       HPI:     Corey Rivera  is a 26 y.o. male with past medical history notable for tobacco and polysubstance abuse, remote  history of seizures related to opiates/drug  withdrawal presents to the ED after being found unresponsive at home with respiratory distress required 4 mg of Narcan at the scene, EMS gave additional 2 mg for total of 6 milligrams of Narcan - Patient is now more awake, patient does admit to fentanyl use -UDS positive for cocaine, THC and benzos but negative for opiates -patient was admitted that [facility] from 01/25/22 thru 01/30/22 for drug withdrawal -Subsequently went to inpatient rehab treatment for 4 weeks, he was discharged from drug rehab in mid January  -Additional history obtained from patient and grandmother [name] who patient stays with, as well as patient's aunt [name] - Patient reports hearing loss bilaterally as well as numbness and tingling over the right upper and lower extremity- -No chest pains no palpitations,  -In the ED patient is found to have an AKI with a creatinine of 2.17, hyperkalemia, as well as elevated CPK -EKG without acute findings -CT head without acute acute findings -MRI brain with subcentimeter acute stroke in cerebellum -WBC was elevated in the ED lactic acidosis noted patient received IV fluids BP improved - EDP discussed case with PCCM attending please see full consult note from PCCM attending -Please see note from Dr. Rory Percy neurology No fever  Or chills  No emesis, no chest pain Has Hearing loss      Review of systems:    In addition to the HPI above,    A full Review of  Systems was done, all other systems reviewed are negative except as noted above in HPI , .      Social History:  Reviewed by me     Social History         Tobacco Use   Smoking status: Smoker, Current Status Unknown      Packs/day: 0.00      Types: Cigarettes   Smokeless tobacco: Never  Substance Use Topics   Alcohol use: Yes       Family History :  Reviewed by me          Family History  Problem Relation Age of Onset   Alcohol  abuse Father     Alcohol abuse Brother     Asthma Brother     Diabetes Brother     Arthritis Maternal Grandmother     Asthma Maternal Grandmother     COPD Maternal Grandmother     Alcohol abuse Maternal Grandfather     Alcohol abuse Paternal Grandfather     Heart attack Paternal Grandfather     Hyperlipidemia Paternal Grandfather         Home Medications:           Prior to Admission medications   Medication Sig Start Date End Date Taking? Authorizing Provider  busPIRone (BUSPAR) 5 MG tablet Take 1 tablet (5 mg total) by mouth 2 (two) times daily. Patient not taking: Reported  on 04/05/2021 03/17/17     Brunetta Jeans, PA-C  escitalopram (LEXAPRO) 20 MG tablet Take 1 tablet (20 mg total) by mouth daily. Patient not taking: Reported on 04/05/2021 02/25/17     Brunetta Jeans, PA-C  pantoprazole (PROTONIX) 40 MG tablet Take 1 tablet (40 mg total) by mouth daily. Patient not taking: Reported on 04/05/2021 01/23/17     Brunetta Jeans, PA-C       Allergies:     No Known Allergies    Physical Exam:    Vitals   Blood pressure (!) 94/56, pulse (!) 108, temperature 99.1 F (37.3 C), temperature source Axillary, resp. rate 16, height 5' 9.02" (1.753 m), weight 83.6 kg, SpO2 98 %.   Physical Examination: General appearance -somewhat sleepy,  Mental status -becoming more awake, falls asleep easily Eyes - sclera anicteric Ears-hearing loss, TMI Neck - supple, no JVD elevation , Chest - clear  to auscultation bilaterally, symmetrical air movement,  Heart - S1 and S2 normal, regular  Abdomen - soft, nontender, nondistended, no masses or organomegaly Neurological -   neck supple without rigidity, cranial nerves II through XII intact, DTR's normal and symmetric Extremities - no pedal edema noted, intact peripheral pulses  Skin - warm, dry        Data Review:     CBC Last Labs      Recent Labs  Lab 04/05/21 1112  WBC 25.3*  HGB 15.5  HCT 48.6  PLT 285  MCV 100.4*  MCH  32.0  MCHC 31.9  RDW 13.4  LYMPHSABS 1.8  MONOABS 1.4*  EOSABS 0.1  BASOSABS 0.1      ------------------------------------------------------------------------------------------------------------------   Chemistries  Last Labs       Recent Labs  Lab 04/05/21 1112 04/05/21 1423  NA 136 134*  K 6.2* 4.9  CL 95* 100  CO2 17* 21*  GLUCOSE 76 207*  BUN 8 12  CREATININE 2.17* 1.70*  CALCIUM 9.0 7.8*  AST 443*  --   ALT 272*  --   ALKPHOS 130*  --   BILITOT 1.3*  --       ------------------------------------------------------------------------------------------------------------------ estimated creatinine clearance is 67.6 mL/min (A) (by C-G formula based on SCr of 1.7 mg/dL (H)). ------------------------------------------------------------------------------------------------------------------  Recent Labs (last 2 labs)   No results for input(s): TSH, T4TOTAL, T3FREE, THYROIDAB in the last 72 hours.   Invalid input(s): FREET3       Coagulation profile Last Labs      Recent Labs  Lab 04/05/21 1112  INR 1.3*      ------------------------------------------------------------------------------------------------------------------- Recent Labs (last 2 labs)   No results for input(s): DDIMER in the last 72 hours.   -------------------------------------------------------------------------------------------------------------------   Cardiac Enzymes  Last Labs   No results for input(s): CKMB, TROPONINI, MYOGLOBIN in the last 168 hours.   Invalid input(s): CK   ------------------------------------------------------------------------------------------------------------------ Labs (Brief)  No results found for: BNP       ---------------------------------------------------------------------------------------------------------------   Urinalysis Labs (Brief)          Component Value Date/Time    COLORURINE YELLOW 04/05/2021 0959    APPEARANCEUR HAZY (A)  04/05/2021 0959    LABSPEC 1.014 04/05/2021 0959    PHURINE 6.0 04/05/2021 0959    GLUCOSEU 50 (A) 04/05/2021 0959    HGBUR LARGE (A) 04/05/2021 0959    BILIRUBINUR NEGATIVE 04/05/2021 0959    KETONESUR 20 (A) 04/05/2021 0959    PROTEINUR 100 (A) 04/05/2021 0959    NITRITE NEGATIVE 04/05/2021 0959  LEUKOCYTESUR NEGATIVE 04/05/2021 0959        ----------------------------------------------------------------------------------------------------------------    Imaging Results:      Imaging Results (Last 48 hours)  CT Head Wo Contrast   Result Date: 04/05/2021 CLINICAL DATA:  25 year old male with history of persistent and recurrent dizziness. EXAM: CT HEAD WITHOUT CONTRAST TECHNIQUE: Contiguous axial images were obtained from the base of the skull through the vertex without intravenous contrast. RADIATION DOSE REDUCTION: This exam was performed according to the departmental dose-optimization program which includes automated exposure control, adjustment of the mA and/or kV according to patient size and/or use of iterative reconstruction technique. COMPARISON:  Head CT 01/25/2021. FINDINGS: Brain: No evidence of acute infarction, hemorrhage, hydrocephalus, extra-axial collection or mass lesion/mass effect. Vascular: No hyperdense vessel or unexpected calcification. Skull: Normal. Negative for fracture or focal lesion. Sinuses/Orbits: No acute finding. Other: None. IMPRESSION: No acute intracranial abnormalities. The appearance of the brain is normal. Electronically Signed   By: Vinnie Langton M.D.   On: 04/05/2021 10:41    MR BRAIN WO CONTRAST   Result Date: 04/05/2021 CLINICAL DATA:  Provided history: Mental status change, alcohol/drug use. Additional history provided: Patient found unresponsive today. EXAM: MRI HEAD WITHOUT CONTRAST TECHNIQUE: Multiplanar, multiecho pulse sequences of the brain and surrounding structures were obtained without intravenous contrast. COMPARISON:  Head CT  04/05/2021.  Brain MRI 01/26/2021. FINDINGS: Brain: Cerebral volume is normal. Subcentimeter acute infarct within the mid-to-inferior left cerebellar hemisphere (series 5, image 8). No cortical encephalomalacia is identified. No significant cerebral white matter disease. No evidence of an intracranial mass. No chronic intracranial blood products. No extra-axial fluid collection. No midline shift. Mega cisterna magna. Vascular: Maintained flow voids within the proximal large arterial vessels. Skull and upper cervical spine: No focal suspicious marrow lesion. Sinuses/Orbits: Visualized orbits show no acute finding. Mild mucosal thickening within the bilateral ethmoid and sphenoid sinuses. Mild-to-moderate mucosal thickening within the bilateral maxillary sinuses. IMPRESSION: Subcentimeter acute infarct within the mid-to-inferior left cerebellar hemisphere. Otherwise unremarkable non-contrast MRI appearance of the brain. Paranasal sinus disease, as described. Electronically Signed   By: Kellie Simmering D.O.   On: 04/05/2021 16:11    DG Chest Port 1 View   Result Date: 04/05/2021 CLINICAL DATA:  Cardiopulmonary arrest. Possible drug overdose. Questionable sepsis-evaluate for abnormality. EXAM: PORTABLE CHEST 1 VIEW COMPARISON:  Radiographs 03/25/2017 and 03/23/2017. FINDINGS: 1005 hours. The heart size and mediastinal contours are normal. The lungs are clear. There is no pleural effusion or pneumothorax. No acute osseous findings are identified. Telemetry leads overlie the chest. IMPRESSION: No evidence of active cardiopulmonary process. Electronically Signed   By: Richardean Sale M.D.   On: 04/05/2021 10:26       Radiological Exams on Admission:  Imaging Results (Last 48 hours)  CT Head Wo Contrast   Result Date: 04/05/2021 CLINICAL DATA:  26 year old male with history of persistent and recurrent dizziness. EXAM: CT HEAD WITHOUT CONTRAST TECHNIQUE: Contiguous axial images were obtained from the base of the  skull through the vertex without intravenous contrast. RADIATION DOSE REDUCTION: This exam was performed according to the departmental dose-optimization program which includes automated exposure control, adjustment of the mA and/or kV according to patient size and/or use of iterative reconstruction technique. COMPARISON:  Head CT 01/25/2021. FINDINGS: Brain: No evidence of acute infarction, hemorrhage, hydrocephalus, extra-axial collection or mass lesion/mass effect. Vascular: No hyperdense vessel or unexpected calcification. Skull: Normal. Negative for fracture or focal lesion. Sinuses/Orbits: No acute finding. Other: None. IMPRESSION: No acute intracranial abnormalities. The appearance  of the brain is normal. Electronically Signed   By: Vinnie Langton M.D.   On: 04/05/2021 10:41    MR BRAIN WO CONTRAST   Result Date: 04/05/2021 CLINICAL DATA:  Provided history: Mental status change, alcohol/drug use. Additional history provided: Patient found unresponsive today. EXAM: MRI HEAD WITHOUT CONTRAST TECHNIQUE: Multiplanar, multiecho pulse sequences of the brain and surrounding structures were obtained without intravenous contrast. COMPARISON:  Head CT 04/05/2021.  Brain MRI 01/26/2021. FINDINGS: Brain: Cerebral volume is normal. Subcentimeter acute infarct within the mid-to-inferior left cerebellar hemisphere (series 5, image 8). No cortical encephalomalacia is identified. No significant cerebral white matter disease. No evidence of an intracranial mass. No chronic intracranial blood products. No extra-axial fluid collection. No midline shift. Mega cisterna magna. Vascular: Maintained flow voids within the proximal large arterial vessels. Skull and upper cervical spine: No focal suspicious marrow lesion. Sinuses/Orbits: Visualized orbits show no acute finding. Mild mucosal thickening within the bilateral ethmoid and sphenoid sinuses. Mild-to-moderate mucosal thickening within the bilateral maxillary sinuses.  IMPRESSION: Subcentimeter acute infarct within the mid-to-inferior left cerebellar hemisphere. Otherwise unremarkable non-contrast MRI appearance of the brain. Paranasal sinus disease, as described. Electronically Signed   By: Kellie Simmering D.O.   On: 04/05/2021 16:11    DG Chest Port 1 View   Result Date: 04/05/2021 CLINICAL DATA:  Cardiopulmonary arrest. Possible drug overdose. Questionable sepsis-evaluate for abnormality. EXAM: PORTABLE CHEST 1 VIEW COMPARISON:  Radiographs 03/25/2017 and 03/23/2017. FINDINGS: 1005 hours. The heart size and mediastinal contours are normal. The lungs are clear. There is no pleural effusion or pneumothorax. No acute osseous findings are identified. Telemetry leads overlie the chest. IMPRESSION: No evidence of active cardiopulmonary process. Electronically Signed   By: Richardean Sale M.D.   On: 04/05/2021 10:26       DVT Prophylaxis -SCD/heparin AM Labs Ordered, also please review Full Orders   Family Communication: Admission, patients condition and plan of care including tests being ordered have been discussed with the patient and  who indicate understanding and agree with the plan    Code Status - Full Code   Likely DC to home    Condition   -stable   Roxan Hockey M.D on 04/05/2021 at 7:24 PM Go to [email or web address] -  for contact info   [facility] - Office  [phone]

## 2021-04-09 NOTE — Progress Notes (Signed)
Patient rested throughout the night. Patient has been NPO since Midnight.

## 2021-04-09 NOTE — Progress Notes (Signed)
° ° °  [facility] has been requested to perform a transesophageal echocardiogram on Corey Rivera for CVA. After careful review of history and examination, the risks and benefits of transesophageal echocardiogram have been explained including risks of esophageal damage, perforation (1:10,000 risk), bleeding, pharyngeal hematoma as well as other potential complications associated with conscious sedation including aspiration, arrhythmia, respiratory failure and death. Alternatives to treatment were discussed, questions were answered. Patient is willing to proceed.   BP has been stable with no requirement for pressor support. Hgb stable at 15.0 and platelets at 173. LFT's elevated but improving. TEE scheduled for today at 1300 with Dr. Wyline Mood. Scheduled with anesthesia assistance given positive UDS on admission.   Ellsworth Lennox, PA-C  04/09/2021 8:03 AM

## 2021-04-09 NOTE — CV Procedure (Signed)
CV Procedure Note  Procedure: Transesophageal echocardiogram Physician: Dr Dina Rich  Indicatiion: Stroke   Patient was brought to the procedure suite after appropriate consent was obtained. The posterior oropharynx was anesthesized with 2% viscous lidocaine and bite block placed. Sedation was achieved with the assistance of anesthesiology, for details please refer to there documentation. TEE probe was intubated into the esophagus without difficulty and several images obtained. Please see TEE report for full findings. No evidence of intracardiac thrombus, no evidence of intracardiac shunting. Cardiopulmonary monitoring was performed throughout the procedure, he tolerated well without complications.    Dina Rich MD

## 2021-04-09 NOTE — TOC Transition Note (Signed)
Transition of Care [facility]) - CM/SW Discharge Note   Patient Details  Name: Ruben Mahler MRN: [id number] Date of Birth: 07/05/1995  [facility] CM/SW Contact:  Karn Cassis, LCSW Phone Number: 04/09/2021, 10:49 AM   Clinical Narrative:  Discussed substance abuse treatment resources with pt. He states he is well aware of available resources and does not need list. PT now recommending outpatient PT. Pt states he has been to [facility] in [place] before and would like referral sent there. LCSW faxed referral and asked MD to enter order. Possible d/c later today. No other needs reported at this time.      Final next level of care: OP Rehab Barriers to Discharge: Barriers Resolved   Patient Goals and CMS Choice Patient states their goals for this hospitalization and ongoing recovery are:: return home   Choice offered to / list presented to : Patient  Discharge Placement                       Discharge Plan and Services                                     Social Determinants of Health (SDOH) Interventions     Readmission Risk Interventions No flowsheet data found.

## 2021-04-09 NOTE — Progress Notes (Signed)
PT Cancellation Note  Patient Details Name: Corey Rivera MRN: [id number] DOB: 18-Aug-1995   Cancelled Treatment:    Reason Eval/Treat Not Completed: Patient at procedure or test/unavailable   3:05 PM, 04/09/21 [name], MPT Physical Therapist with [facility] [phone] office [phone] mobile phone

## 2021-04-09 NOTE — Anesthesia Preprocedure Evaluation (Signed)
Anesthesia Evaluation  Patient identified by MRN, date of birth, ID band Patient awake    Reviewed: Allergy & Precautions, H&P , NPO status , Patient's Chart, lab work & pertinent test results, reviewed documented beta blocker date and time   Airway Mallampati: II  TM Distance: >3 FB Neck ROM: full    Dental no notable dental hx.    Pulmonary neg pulmonary ROS,    Pulmonary exam normal breath sounds clear to auscultation       Cardiovascular Exercise Tolerance: Good negative cardio ROS   Rhythm:regular Rate:Normal     Neuro/Psych Seizures -,  PSYCHIATRIC DISORDERS Anxiety Depression CVA negative neurological ROS  negative psych ROS   GI/Hepatic negative GI ROS, Neg liver ROS, GERD  Medicated,  Endo/Other  negative endocrine ROS  Renal/GU negative Renal ROS  negative genitourinary   Musculoskeletal   Abdominal   Peds  Hematology negative hematology ROS (+)   Anesthesia Other Findings   Reproductive/Obstetrics negative OB ROS                             Anesthesia Physical Anesthesia Plan  ASA: 4 and emergent  Anesthesia Plan: General   Post-op Pain Management:    Induction:   PONV Risk Score and Plan: Propofol infusion  Airway Management Planned:   Additional Equipment:   Intra-op Plan:   Post-operative Plan:   Informed Consent: I have reviewed the patients History and Physical, chart, labs and discussed the procedure including the risks, benefits and alternatives for the proposed anesthesia with the patient or authorized representative who has indicated his/her understanding and acceptance.     Dental Advisory Given  Plan Discussed with: CRNA  Anesthesia Plan Comments:         Anesthesia Quick Evaluation

## 2021-04-09 NOTE — TOC Transition Note (Signed)
Transition of Care [facility]) - CM/SW Discharge Note   Patient Details  Name: Edisson Pavlovich MRN: [id number] Date of Birth: 1995-10-25  Transition of Care (TOC) CM/SW Contact:  Joaquin Courts, RN Phone Number: 04/09/2021, 1:15 PM   Clinical Narrative:    Signed orders for Ambulatory PT referral faxed to clinic at [phone]. No further TOC needs identified.   Final next level of care: OP Rehab Barriers to Discharge: Barriers Resolved   Patient Goals and CMS Choice Patient states their goals for this hospitalization and ongoing recovery are:: return home   Choice offered to / list presented to : Patient  Discharge Placement                       Discharge Plan and Services                                     Social Determinants of Health (SDOH) Interventions     Readmission Risk Interventions No flowsheet data found.

## 2021-04-09 NOTE — Progress Notes (Signed)
PROGRESS NOTE     Corey Rivera, is a 26 y.o. male, DOB - 1995/08/20, BSJ:[id number]  Admit date - 04/05/2021   Admitting Physician Yazaira Speas Denton Brick, MD  Outpatient Primary MD for the patient is Pcp, No  LOS - 4  Chief Complaint  Patient presents with   Drug Overdose        Brief Narrative:  - 26 y.o. 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 admitted on 04/17/2021 with  acute cerebellar stroke and hearing loss (I suspect AICA territory CVA)   -Assessment and Plan:  1) acute cerebellar stroke-- with Acute  hearing loss ---   -- Cannot rule out AICA territory stroke Cerebellar infarction in the territory of the anterior inferior cerebellar artery (AICA) can produce a unique stroke syndrome in that it is typically accompanied by  hearing loss -MRI brain noted with  acute stroke-Subcentimeter acute infarct within the mid-to-inferior left cerebellar hemisphere. -CT head noted without acute findings -MRA head and MRA neck w/o LVO or hemodynamically significant stenosis -Hold off on Lipitor given elevated CKs -we Allowed permissive hypertension -Patient with history of cocaine use--??  Some component of vasospasms contributing to his stroke -Keep patient on telemetry and watch for arrhythmias -TSH-WNL -LDL -30, HDL 36 total cholesterol 70 triglycerides 18 -A1c 4.6 -PT, OT and speech evaluation appreciated--recommend outpatient rehab given improvement in stroke symptoms -- patient's neurodeficits have improved significantly -c/n  aspirin,  -Patient's hearing is much improved -TTE/Echo with EF of 30 to 35% and global hypokinesis-- -Improved right-sided subtle hemiparesis and sensory loss with difficulty with fine motor function especially in the right upper extremity noted -04/09/21---TEE is negative--EF on TEE 35 to 40%, agitated saline contrast bubble Rivera was negative, with no evidence  of any interatrial shunt\ -Anticipate discharge home on 04/10/2021 with outpatient physical therapy, aspirin 81 mg daily monotherapy and outpatient Zio patch to watch for arrhythmias   2) acute metabolic encephalopathy/drug overdose-- -likely related to acute stroke and polysubstance abuse -UDS positive for cocaine, benzodiazepine and THC -Patient states he used fentanyl as well--UDS did not show opiates -The patient responded very well to Narcan -Mentation appears back to baseline -Patient is cooperative and polite   3) right leg swelling----right lower extremity venous Dopplers without acute DVT, there is evidence of soft tissue swelling -No need for anticoagulation -CK is rising most likely due to fall with injury to right upper extremity and right lower extremity  4) social/ethics--- patient was admitted that [facility] from 01/25/22 thru 01/30/22 for drug withdrawal -Subsequently went to inpatient rehab treatment for 4 weeks, he was discharged from drug rehab in mid January  -Additional history obtained from patient and grandmother Corey Rivera who patient stays with, as well as patient's aunt Corey Rivera -Patient is a full code --Mentation appears back to baseline -Patient is cooperative and polite   5)) hypertension/leukocytosis/lactic acidosis/hypothermia--query reactive due to persistent drug abuse, acute stroke, compounded by dehydration/volume depletion --Patient met/criteria for SIRS with tachypnea, tachycardia and leukocytosis -Continue cefepime and vancomycin pending further culture data -Chest x-ray without acute infection -UA is not suggestive of UTI -Leukocytosis resolved   6)Traumatic rhabdomyolysis/elevated CPK -Patient had a fall landed on his right side while overdosed -CPK 2,361 >> 3,180>>17,226>>11,066 -Continue aggressive hydration with IV fluids/bicarb drip  7)AKI/hyperkalemia/-hyponatremia---acute kidney injury  Hyponatremia, hyperkalemia  and AKI has resolved with hydration and interventions -Lactic acid is down to 1.3 from greater than 9 -  renally adjust medications, avoid nephrotoxic agents / dehydration  / hypotension   8) history of hep C--- LFTs noted, patient will need outpatient follow-up -LFTs are trending back up most likely due to rhabdomyolysis   9)Prior history of seizures in the setting of drug/opiate withdrawal--- no evidence of seizures at this time, EEG without epileptiform finding  10)HFrEF---TTE /Echo with EF of 30 to 35% and global hypokinesis -???  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 -Patient appears to have compensated systolic CHF -Avoid BB due to on-going cocaine use- -Hold off on Losartan to avoid hypotension in a post CVA patient  TEE on 04/09/2021 negative as noted above with EF of 35 to 40%  11)Psych-[facility] evaluation appreciated psychiatric team reevaluated patient and recommended outpatient follow-up  --Patient remains medically stable and cleared for transfer to psychiatric service   Disposition/Need for in-Hospital Stay- patient unable to be discharged at this time due to elevated CPK and elevated LFTs/rhabdomyolysis  -Anticipate discharge Home with grandmother with outpatient physical therapy and outpatient drug rehab/mental health follow-up -Patient will need aspirin monotherapy upon discharge -Patient will also need outpatient follow-up with GI physician and repeat LFTs post discharge  status is: Inpatient   Remains inpatient appropriate because:    Dispo: The patient is from: Home              Anticipated d/c is to: Home with grandmother with outpatient physical therapy and outpatient drug rehab/mental health follow-up              Anticipated d/c date is: 1 days              Patient currently is Not medically stable to d/c. Barriers: -Not medically stable  Code Status :  -  Code Status: Full Code   Family Communication:    grandmother Corey Rivera who patient stays with,  as well as patient's aunt Corey Rivera  DVT Prophylaxis  :   heparin heparin  Lab Results  Component Value Date   PLT 173 04/09/2021    Inpatient Medications  Scheduled Meds:  aspirin EC  81 mg Oral Daily   chlorhexidine       Chlorhexidine Gluconate Cloth  6 each Topical Q0600   dextromethorphan-guaiFENesin  1 tablet Oral BID   enoxaparin (LOVENOX) injection  40 mg Subcutaneous Q24H   multivitamin with minerals  1 tablet Oral Daily   mupirocin ointment  1 application Nasal BID   nystatin  5 mL Oral QID   pantoprazole (PROTONIX) IV  40 mg Intravenous Daily   prochlorperazine  25 mg Rectal Once   sodium chloride flush  3 mL Intravenous Q12H   sodium chloride flush  3 mL Intravenous Q12H   Continuous Infusions:  sodium chloride      sodium bicarbonate (isotonic) infusion in sterile water 125 mL/hr at 04/09/21 0514   sodium chloride     PRN Meds:.sodium chloride, acetaminophen **OR** acetaminophen, albuterol, bisacodyl, ondansetron **OR** ondansetron (ZOFRAN) IV, polyethylene glycol, sodium chloride flush   Anti-infectives (From admission, onward)    Start     Dose/Rate Route Frequency Ordered Stop   04/06/21 0200  vancomycin (VANCOREADY) IVPB 750 mg/150 mL  Status:  Discontinued        750 mg 150 mL/hr over 60 Minutes Intravenous Every 12 hours 04/05/21 1218 04/06/21 1308   04/06/21 0000  ceFEPIme (MAXIPIME) 2 g in sodium chloride 0.9 % 100 mL IVPB  Status:  Discontinued        2 g 200  mL/hr over 30 Minutes Intravenous Every 12 hours 04/05/21 1213 04/08/21 0809   04/05/21 1215  vancomycin (VANCOREADY) IVPB 1750 mg/350 mL        1,750 mg 175 mL/hr over 120 Minutes Intravenous  Once 04/05/21 1204 04/05/21 1646   04/05/21 1200  ceFEPIme (MAXIPIME) 2 g in sodium chloride 0.9 % 100 mL IVPB        2 g 200 mL/hr over 30 Minutes Intravenous  Once 04/05/21 1155 04/05/21 1343   04/05/21 1200  metroNIDAZOLE (FLAGYL) IVPB 500 mg        500 mg 100 mL/hr over 60 Minutes Intravenous   Once 04/05/21 1155 04/05/21 1343   04/05/21 1200  vancomycin (VANCOCIN) IVPB 1000 mg/200 mL premix  Status:  Discontinued        1,000 mg 200 mL/hr over 60 Minutes Intravenous  Once 04/05/21 1155 04/05/21 1204         Subjective: Corey Rivera today has   no emesis,  No chest pain,   - Eating and drinking well, No fever  Or chills  -Grandmother at bedside, questions answered   Objective: Vitals:   04/09/21 0458 04/09/21 1221 04/09/21 1344 04/09/21 1400  BP: 125/75 137/83 (!) 84/47 (!) 103/59  Pulse: 83 82  70  Resp: 20 13  15   Temp: 97.8 F (36.6 C) 98.3 F (36.8 C) 97.7 F (36.5 C)   TempSrc: Oral Oral    SpO2: 99% 98% 98% 98%  Weight:  83.8 kg    Height:  5' 9.25" (1.759 m)      Intake/Output Summary (Last 24 hours) at 04/09/2021 1621 Last data filed at 04/09/2021 1326 Gross per 24 hour  Intake 440 ml  Output --  Net 440 ml   Filed Weights   04/06/21 0500 04/07/21 0530 04/09/21 1221  Weight: 85.2 kg 83.8 kg 83.8 kg    Physical Exam  Gen:- Awake Alert, in no apparent distress  HEENT:- [place].AT, No sclera icterus Ears--- hearing improved Neck-Supple Neck,No JVD,.  Lungs-  CTAB , fair symmetrical air movement CV- S1, S2 normal, regular  Abd-  +ve B.Sounds, Abd Soft, No tenderness,    Extremity/Skin:-Improving right calf swelling and tenderness, pedal pulses present  Psych-affect is appropriate, oriented x3, -Mentation appears back to baseline, -Patient is cooperative and polite Neuro--improving right-sided subtle hemiparesis and sensory loss with difficulty with fine motor function especially in the right upper extremity noted  Data Reviewed: I have personally reviewed following labs and imaging studies  CBC: Recent Labs  Lab 04/05/21 1112 04/06/21 0548 04/07/21 0301 04/08/21 0432 04/09/21 0527  WBC 25.3* 21.5* 10.1 7.2 9.0  NEUTROABS 21.5*  --   --   --   --   HGB 15.5 15.1 13.4 12.7* 15.0  HCT 48.6 43.9 41.1 38.7* 45.0  MCV 100.4* 97.1 100.2* 94.4  93.2  PLT 285 178 128* 149* 683   Basic Metabolic Panel: Recent Labs  Lab 04/05/21 1112 04/05/21 1423 04/06/21 0548 04/07/21 0301 04/08/21 0432 04/09/21 0527  NA 136 134* 133* 135  --  138  K 6.2* 4.9 4.5 4.3  --  3.9  CL 95* 100 103 104  --  98  CO2 17* 21* 23 25  --  27  GLUCOSE 76 207* 132* 108*  --  96  BUN 8 12 19 12   --  8  CREATININE 2.17* 1.70* 1.16 0.96 0.83 0.90  CALCIUM 9.0 7.8* 7.8* 8.0*  --  8.5*  PHOS  --   --   --  1.5*  --   --    GFR: Estimated Creatinine Clearance: 128.7 mL/min (by C-G formula based on SCr of 0.9 mg/dL). Liver Function Tests: Recent Labs  Lab 04/05/21 1112 04/06/21 0548 04/07/21 0301 04/09/21 0527  AST 443* 1,424*  --  962*  ALT 272* 479*  --  448*  ALKPHOS 130* 66  --  58  BILITOT 1.3* 0.7  --  0.9  PROT 7.7 4.9*  --  5.5*  ALBUMIN 4.6 2.9* 3.0* 3.2*   Cardiac Enzymes: Recent Labs  Lab 04/05/21 1112 04/06/21 0548 04/08/21 0432 04/09/21 0527  CKTOTAL 2,361* 3,180* 17,226* 11,066*   BNP (last 3 results) No results for input(s): PROBNP in the last 8760 hours. HbA1C: No results for input(s): HGBA1C in the last 72 hours.  Sepsis Labs: $RemoveBefo'@[email or web address]'[email or web address]$ (procalcitonin:4,lacticidven:4) ) Recent Results (from the past 240 hour(s))  Urine Culture     Status: None   Collection Time: 04/05/21  9:59 AM   Specimen: Urine, Catheterized  Result Value Ref Range Status   Specimen Description   Final    URINE, CATHETERIZED Performed at [facility], [address], [place], [place] [address]    Special Requests   Final    NONE Performed at [facility], [address], [place], [place] [address]    Culture   Final    NO GROWTH Performed at [facility], [place] [address], [place], [place] [address]    Report Status 04/07/2021 FINAL  Final  Blood Culture (routine x 2)     Status: None (Preliminary result)   Collection Time: 04/05/21 11:12 AM   Specimen: BLOOD RIGHT HAND  Result Value Ref Range Status   Specimen Description   Final     BLOOD RIGHT HAND BOTTLES DRAWN AEROBIC ONLY Performed at [facility] [address], [place], [place] [address]    Special Requests   Final    Blood Culture adequate volume Performed at [facility], [address] [place], [place] [address]    Culture   Final    NO GROWTH 4 DAYS Performed at [facility], [place] [address], [place], [place] [address]    Report Status PENDING  Incomplete  Blood Culture (routine x 2)     Status: None (Preliminary result)   Collection Time: 04/05/21 11:12 AM   Specimen: BLOOD LEFT HAND  Result Value Ref Range Status   Specimen Description   Final    BLOOD LEFT HAND BOTTLES DRAWN AEROBIC ONLY Performed at [facility], [address], [place], [place] [address]    Special Requests   Final    Blood Culture adequate volume Performed at [facility], [address] [place],  [address]    Culture   Final    NO GROWTH 4 DAYS Performed at [facility], [place] [address], [place],  [address]    Report Status PENDING  Incomplete  Resp Panel by RT-PCR (Flu A&B, Covid) Nasopharyngeal Swab     Status: None   Collection Time: 04/05/21  1:44 PM   Specimen: Nasopharyngeal Swab; Nasopharyngeal(NP) swabs in vial transport medium  Result Value Ref Range Status   SARS Coronavirus 2 by RT PCR NEGATIVE NEGATIVE Final    Comment: (NOTE) SARS-CoV-2 target nucleic acids are NOT DETECTED.  The SARS-CoV-2 RNA is generally detectable in upper respiratory specimens during the acute phase of infection. The lowest concentration of SARS-CoV-2 viral copies this assay can detect is 138 copies/mL. A negative result does not preclude  SARS-Cov-2 infection and should not be used as the sole basis for treatment or other patient management decisions. A negative result may occur with  improper specimen collection/handling, submission of specimen other than nasopharyngeal swab, presence of viral mutation(s) within the areas targeted by this assay, and  inadequate number of viral copies(<138 copies/mL). A negative result must be combined with clinical observations, patient history, and epidemiological information. The expected result is Negative.  Fact Sheet for Patients:  EntrepreneurPulse.com.au  Fact Sheet for Healthcare Providers:  IncredibleEmployment.be  This test is no t yet approved or cleared by the [place] FDA and  has been authorized for detection and/or diagnosis of SARS-CoV-2 by FDA under an Emergency Use Authorization (EUA). This EUA will remain  in effect (meaning this test can be used) for the duration of the COVID-19 declaration under Section 564(b)(1) of the Act, 21 U.S.C.section 360bbb-3(b)(1), unless the authorization is terminated  or revoked sooner.       Influenza A by PCR NEGATIVE NEGATIVE Final   Influenza B by PCR NEGATIVE NEGATIVE Final    Comment: (NOTE) The Xpert Xpress SARS-CoV-2/FLU/RSV plus assay is intended as an aid in the diagnosis of influenza from Nasopharyngeal swab specimens and should not be used as a sole basis for treatment. Nasal washings and aspirates are unacceptable for Xpert Xpress SARS-CoV-2/FLU/RSV testing.  Fact Sheet for Patients: EntrepreneurPulse.com.au  Fact Sheet for Healthcare Providers: IncredibleEmployment.be  This test is not yet approved or cleared by the [place] FDA and has been authorized for detection and/or diagnosis of SARS-CoV-2 by FDA under an Emergency Use Authorization (EUA). This EUA will remain in effect (meaning this test can be used) for the duration of the COVID-19 declaration under Section 564(b)(1) of the Act, 21 U.S.C. section 360bbb-3(b)(1), unless the authorization is terminated or revoked.  Performed at [facility], [address], [place], [place] [address]   MRSA Next Gen by PCR, Nasal     Status: Abnormal   Collection Time: 04/05/21  4:34 PM   Specimen:  Nasal Mucosa; Nasal Swab  Result Value Ref Range Status   MRSA by PCR Next Gen DETECTED (A) NOT DETECTED Final    Comment: RESULT CALLED TO, READ BACK BY AND VERIFIED WITH: MANYARD,R AT 2130 ON 2.10.23 BY RUCINSKI,B (NOTE) The GeneXpert MRSA Assay (FDA approved for NASAL specimens only), is one component of a comprehensive MRSA colonization surveillance program. It is not intended to diagnose MRSA infection nor to guide or monitor treatment for MRSA infections. Test performance is not FDA approved in patients less than 39 years old. Performed at [facility], [address] [place], [place] [address]       Radiology Studies: ECHO TEE  Result Date: 04/09/2021    TRANSESOPHOGEAL ECHO REPORT   Patient Name:   Corey Rivera Date of Exam: 04/09/2021 Medical Rec #:  [id number]    Height:       69.0 in Accession #:    [id number]   Weight:       184.7 lb Date of Birth:  1995-06-20    BSA:          1.998 m Patient Age:    25 years     BP:           129/76 mmHg Patient Gender: M            HR:           84 bpm. Exam Location:  [facility] Procedure: Transesophageal Echo, Cardiac Doppler and  Color Doppler Indications:    Cerebral Infarction, unspecified I63.9  History:        Patient has prior history of Echocardiogram examinations, most                 recent 04/06/2021. Polysubstance Abuse with OD.  Sonographer:    Alvino Chapel RCS Referring Phys: [id number] [place]: The transesophogeal probe was passed without difficulty through the esophogus of the patient. Sedation performed by different physician. The patient developed no complications during the procedure. IMPRESSIONS  1. Left ventricular ejection fraction, by estimation, is 35 to 40%. The left ventricle has moderately decreased function. The left ventricle demonstrates global hypokinesis.  2. Right ventricular systolic function is low normal. The right ventricular size is normal.  3. No left atrial/left atrial appendage thrombus was  detected. The LAA emptying velocity was 115 cm/s.  4. The pericardial effusion is circumferential.  5. The mitral valve is normal in structure. Trivial mitral valve regurgitation. No evidence of mitral stenosis.  6. The aortic valve is tricuspid. Aortic valve regurgitation is not visualized. No aortic stenosis is present.  7. Agitated saline contrast bubble Rivera was negative, with no evidence of any interatrial shunt. FINDINGS  Left Ventricle: Left ventricular ejection fraction, by estimation, is 35 to 40%. The left ventricle has moderately decreased function. The left ventricle demonstrates global hypokinesis. The left ventricular internal cavity size was normal in size. Right Ventricle: The right ventricular size is normal. No increase in right ventricular wall thickness. Right ventricular systolic function is low normal. Left [facility]: Left atrial size was normal in size. No left atrial/left atrial appendage thrombus was detected. The LAA emptying velocity was 115 cm/s. Right [facility]: Right atrial size was normal in size. Pericardium: Trivial pericardial effusion is present. The pericardial effusion is circumferential. Mitral Valve: The mitral valve is normal in structure. Trivial mitral valve regurgitation. No evidence of mitral valve stenosis. Tricuspid Valve: The tricuspid valve is normal in structure. Tricuspid valve regurgitation is not demonstrated. No evidence of tricuspid stenosis. Aortic Valve: The aortic valve is tricuspid. Aortic valve regurgitation is not visualized. No aortic stenosis is present. Pulmonic Valve: The pulmonic valve was not well visualized. Pulmonic valve regurgitation is not visualized. No evidence of pulmonic stenosis. Aorta: No aortic plaque. The aortic root is normal in size and structure. IAS/Shunts: No atrial level shunt detected by color flow Doppler. Agitated saline contrast bubble Rivera was negative, with no evidence of any interatrial shunt.   AORTA Ao Root diam: 3.30 cm  Carlyle Dolly MD Electronically signed by Carlyle Dolly MD Signature Date/Time: 04/09/2021/2:58:09 PM    Final      Scheduled Meds:  aspirin EC  81 mg Oral Daily   chlorhexidine       Chlorhexidine Gluconate Cloth  6 each Topical Q0600   dextromethorphan-guaiFENesin  1 tablet Oral BID   enoxaparin (LOVENOX) injection  40 mg Subcutaneous Q24H   multivitamin with minerals  1 tablet Oral Daily   mupirocin ointment  1 application Nasal BID   nystatin  5 mL Oral QID   pantoprazole (PROTONIX) IV  40 mg Intravenous Daily   prochlorperazine  25 mg Rectal Once   sodium chloride flush  3 mL Intravenous Q12H   sodium chloride flush  3 mL Intravenous Q12H   Continuous Infusions:  sodium chloride      sodium bicarbonate (isotonic) infusion in sterile water 125 mL/hr at 04/09/21 0514   sodium chloride  LOS: 4 days    Roxan Hockey M.D on 04/09/2021 at 4:21 PM  Go to [email or web address] - for contact info  [facility] - Office  [phone]  If 7PM-7AM, please contact night-coverage [email or web address] Password [facility] 04/09/2021, 4:21 PM

## 2021-04-10 ENCOUNTER — Other Ambulatory Visit (HOSPITAL_COMMUNITY): Payer: Self-pay

## 2021-04-10 ENCOUNTER — Encounter (HOSPITAL_COMMUNITY): Payer: Self-pay | Admitting: Cardiology

## 2021-04-10 DIAGNOSIS — T50901D Poisoning by unspecified drugs, medicaments and biological substances, accidental (unintentional), subsequent encounter: Secondary | ICD-10-CM

## 2021-04-10 DIAGNOSIS — I639 Cerebral infarction, unspecified: Secondary | ICD-10-CM

## 2021-04-10 LAB — COMPREHENSIVE METABOLIC PANEL
ALT: 341 U/L — ABNORMAL HIGH (ref 0–44)
AST: 544 U/L — ABNORMAL HIGH (ref 15–41)
Albumin: 3.4 g/dL — ABNORMAL LOW (ref 3.5–5.0)
Alkaline Phosphatase: 51 U/L (ref 38–126)
Anion gap: 7 (ref 5–15)
BUN: 8 mg/dL (ref 6–20)
CO2: 30 mmol/L (ref 22–32)
Calcium: 8.4 mg/dL — ABNORMAL LOW (ref 8.9–10.3)
Chloride: 100 mmol/L (ref 98–111)
Creatinine, Ser: 0.98 mg/dL (ref 0.61–1.24)
GFR, Estimated: >60 mL/min (ref >60)
Glucose, Bld: 119 mg/dL — ABNORMAL HIGH (ref 70–99)
Potassium: 3.3 mmol/L — ABNORMAL LOW (ref 3.5–5.1)
Sodium: 137 mmol/L (ref 135–145)
Total Bilirubin: 0.8 mg/dL (ref 0.3–1.2)
Total Protein: 5.8 g/dL — ABNORMAL LOW (ref 6.5–8.1)

## 2021-04-10 LAB — CBC
HCT: 41.3 % (ref 39.0–52.0)
Hemoglobin: 14.6 g/dL (ref 13.0–17.0)
MCH: 33 pg (ref 26.0–34.0)
MCHC: 35.4 g/dL (ref 30.0–36.0)
MCV: 93.4 fL (ref 80.0–100.0)
Platelets: 153 10*3/uL (ref 150–400)
RBC: 4.42 MIL/uL (ref 4.22–5.81)
RDW: 13.3 % (ref 11.5–15.5)
WBC: 12.4 10*3/uL — ABNORMAL HIGH (ref 4.0–10.5)
nRBC: 0 % (ref 0.0–0.2)

## 2021-04-10 LAB — CULTURE, BLOOD (ROUTINE X 2)
Culture: NO GROWTH
Culture: NO GROWTH
Special Requests: ADEQUATE
Special Requests: ADEQUATE

## 2021-04-10 LAB — GLUCOSE, CAPILLARY
Glucose-Capillary: 105 mg/dL — ABNORMAL HIGH (ref 70–99)
Glucose-Capillary: 106 mg/dL — ABNORMAL HIGH (ref 70–99)

## 2021-04-10 LAB — CK: Total CK: 5196 U/L — ABNORMAL HIGH (ref 49–397)

## 2021-04-10 MED ORDER — PANTOPRAZOLE SODIUM 40 MG PO TBEC: 40.0000 mg | DELAYED_RELEASE_TABLET | Freq: Every day | ORAL | 3 refills | Status: DC

## 2021-04-10 MED ORDER — CARVEDILOL 3.125 MG PO TABS
3.1250 mg | ORAL_TABLET | Freq: Two times a day (BID) | ORAL | Status: DC
  Administered 2021-04-10: 3.125 mg via ORAL
  Filled 2021-04-10: qty 1

## 2021-04-10 MED ORDER — ADULT MULTIVITAMIN W/MINERALS CH: 1.0000 | ORAL_TABLET | Freq: Every day | ORAL | Status: DC

## 2021-04-10 MED ORDER — SACUBITRIL-VALSARTAN 24-26 MG PO TABS
1.0000 | ORAL_TABLET | Freq: Two times a day (BID) | ORAL | 3 refills | Status: DC
  Filled 2021-04-10: qty 60, 30d supply, fill #0
  Filled 2021-05-07: qty 60, 30d supply, fill #1
  Filled 2021-06-28: qty 60, 30d supply, fill #2

## 2021-04-10 MED ORDER — ASPIRIN 81 MG PO TBEC: 81.0000 mg | DELAYED_RELEASE_TABLET | Freq: Every day | ORAL | 3 refills | Status: DC

## 2021-04-10 MED ORDER — SACUBITRIL-VALSARTAN 24-26 MG PO TABS
1.0000 | ORAL_TABLET | Freq: Two times a day (BID) | ORAL | Status: DC
  Administered 2021-04-10: 1 via ORAL
  Filled 2021-04-10: qty 1

## 2021-04-10 MED ORDER — LOPERAMIDE HCL 2 MG PO CAPS
4.0000 mg | ORAL_CAPSULE | ORAL | Status: DC | PRN
  Administered 2021-04-10: 4 mg via ORAL
  Filled 2021-04-10: qty 2

## 2021-04-10 MED ORDER — LOPERAMIDE HCL 2 MG PO CAPS: 4.0000 mg | ORAL_CAPSULE | Freq: Three times a day (TID) | ORAL | 0 refills | Status: DC | PRN

## 2021-04-10 MED ORDER — CARVEDILOL 3.125 MG PO TABS: 3.1250 mg | ORAL_TABLET | Freq: Two times a day (BID) | ORAL | 3 refills | Status: DC

## 2021-04-10 MED ORDER — NYSTATIN 100000 UNIT/ML MT SUSP: 5.0000 mL | Freq: Four times a day (QID) | OROMUCOSAL | 0 refills | Status: DC

## 2021-04-10 MED ORDER — ONDANSETRON HCL 4 MG PO TABS: 4.0000 mg | ORAL_TABLET | Freq: Four times a day (QID) | ORAL | 0 refills | Status: DC | PRN

## 2021-04-10 NOTE — Anesthesia Postprocedure Evaluation (Signed)
Anesthesia Post Note  Patient: Corey Rivera  Procedure(s) Performed: TRANSESOPHAGEAL ECHOCARDIOGRAM (TEE)  Patient location during evaluation: Phase II Anesthesia Type: General Level of consciousness: awake Pain management: pain level controlled Vital Signs Assessment: post-procedure vital signs reviewed and stable Respiratory status: spontaneous breathing and respiratory function stable Cardiovascular status: blood pressure returned to baseline and stable Postop Assessment: no headache and no apparent nausea or vomiting Anesthetic complications: no Comments: Late entry   No notable events documented.   Last Vitals:  Vitals:   04/10/21 0528 04/10/21 1234  BP: 140/76 127/78  Pulse: 88 88  Resp: 20 18  Temp: 36.8 C 36.9 C  SpO2: 99% 97%    Last Pain:  Vitals:   04/10/21 1234  TempSrc: Oral  PainSc:                  Windell Norfolk

## 2021-04-10 NOTE — Discharge Summary (Signed)
Physician Discharge Summary  Corey Rivera WER:[id number] DOB: April 21, 1995 DOA: 04/05/2021   Admit date: 04/05/2021 Discharge date: 04/10/2021  Admitted From:  Home  Disposition: Home   Recommendations for Outpatient Follow-up:  Follow up with outpatient resources for substance abuse treatment as arranged Please follow up with cardiology on 05/15/21 as arranged Please follow up with neurology in 1 month for stroke follow up  Discharge Condition: STABLE   CODE STATUS: FULL DIET: Heart  / 2 gm sodium   Brief Hospitalization Summary: Please see all hospital notes, images, labs for full details of the hospitalization. ADMISSION HPI:  26 y.o. 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 presents to the ED after being found unresponsive at home with respiratory distress required 4 mg of Narcan at the scene, EMS gave additional 2 mg for total of 6 milligrams of Narcan - Patient is now more awake, patient does admit to fentanyl use -UDS positive for cocaine, THC and benzos but negative for opiates -patient was admitted that [facility] from 01/25/22 thru 01/30/22 for drug withdrawal -Subsequently went to inpatient rehab treatment for 4 weeks, he was discharged from drug rehab in mid January  -Additional history obtained from patient and grandmother [name] who patient stays with, as well as patient's aunt [name]  Patient reports hearing loss bilaterally as well as numbness and tingling over the right upper and lower extremity- -No chest pains no palpitations,  -In the ED patient is found to have an AKI with a creatinine of 2.17, hyperkalemia, as well as elevated CPK -EKG without acute findings -CT head without acute acute findings -MRI brain with subcentimeter acute stroke in cerebellum -WBC was elevated in the ED lactic acidosis noted patient received IV fluids BP  improved - EDP discussed case with PCCM attending please see full consult note from PCCM attending -Please see note from Dr. Rory Percy neurology No fever  Or chills  No emesis, no chest pain Has Hearing loss  HOSPITAL COURSE BY PROBLEM LIST   1) acute cerebellar stroke-- with Acute  hearing loss ---   -- Cannot rule out AICA territory stroke Cerebellar infarction in the territory of the anterior inferior cerebellar artery (AICA) can produce a unique stroke syndrome in that it is typically accompanied by  hearing loss -MRI brain noted with  acute stroke-Subcentimeter acute infarct within the mid-to-inferior left cerebellar hemisphere. -CT head noted without acute findings -MRA head and MRA neck w/o LVO or hemodynamically significant stenosis -Hold off on Lipitor given elevated CKs -we Allowed permissive hypertension -Patient with history of cocaine use--??  Some component of vasospasms contributing to his stroke -Keep patient on telemetry and watch for arrhythmias -TSH-WNL -LDL -30, HDL 36 total cholesterol 70 triglycerides 18 -A1c 4.6 -PT, OT and speech evaluation appreciated--recommend outpatient rehab given improvement in stroke symptoms -- patient's neurodeficits have improved significantly -c/n  aspirin,  -Fortunately patient's hearing loss is much improved and now seems back to baseline.  -TTE/Echo with EF of 30 to 35% and global hypokinesis-- -Improved right-sided subtle hemiparesis and sensory loss with difficulty with fine motor function especially in the right upper extremity noted -04/09/21---TEE is negative--EF on TEE 35 to 40%, agitated saline contrast bubble study was negative, with no evidence of any interatrial shunt\ -Anticipate discharge home on 04/10/2021 with outpatient physical therapy, aspirin 81 mg daily monotherapy  - Ambulatory referral to outpatient neurology for 1 month stroke follow up  2) acute metabolic encephalopathy/drug overdose-- -likely related to acute  stroke and polysubstance abuse -UDS positive for cocaine, benzodiazepine and THC -Patient states he used fentanyl as well--UDS did not show opiates -The patient responded very well to [place] appears back to baseline -Patient is cooperative and polite   3) right leg swelling----right lower extremity venous Dopplers without acute DVT, there is evidence of soft tissue swelling -No need for anticoagulation -CK is rising most likely due to fall with injury to right upper extremity and right lower extremity - now CK trending back down.     4) social/ethics--- patient was admitted that [facility] from 01/25/22 thru 01/30/22 for drug withdrawal -Subsequently went to inpatient rehab treatment for 4 weeks, he was discharged from drug rehab in mid January  -Additional history obtained from patient and grandmother [name] who patient stays with, as well as patient's aunt [name] -Patient is a full code --Mentation appears back to baseline -Patient is cooperative and polite   5)) hypertension/leukocytosis/lactic acidosis/hypothermia--reactive due to persistent drug abuse, acute stroke, compounded by dehydration/volume depletion --Patient met/criteria for SIRS with tachypnea, tachycardia and leukocytosis -Continue cefepime and vancomycin pending further culture data -Chest x-ray without acute infection -UA is not suggestive of UTI -Leukocytosis resolved -pt started on coreg and entresto by cardiology team -Pt has appt to see Dr. Harrington Challenger on 05/15/21 at 2:40 p   6)Traumatic rhabdomyolysis/elevated CPK -Patient had a fall landed on his right side while overdosed -CPK 2,361 >> 3,180>>17,226>>11,066 -He was treated with aggressive hydration with IV fluids/bicarb drip   7)AKI/hyperkalemia/-hyponatremia---acute kidney injury  Hyponatremia, hyperkalemia and AKI has resolved with hydration and interventions -Lactic acid is down to 1.3 from greater than 9 - renally  adjust medications, avoid nephrotoxic agents / dehydration  / hypotension   8) history of hep C--- LFTs noted, patient will need outpatient follow-up -LFTs are trending back up most likely due to rhabdomyolysis   9)Prior history of seizures in the setting of drug/opiate withdrawal--- no evidence of seizures at this time, EEG without epileptiform finding   10)NEW CARDIOMYOPATHY/HFrEF---TTE /Echo with EF of 30 to 35% and global hypokinesis - Related to cocaine use with cardiomyopathy - DISCUSSED WITH DR. BRANCH CARDIOLOGY - Dr. Harl Bowie started him on coreg 3.125 mg BID and entresto 24/36 BID with plans for outpatient follow up with Dr. 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 -Avoid BB due to on-going cocaine use- -Hold off on Losartan to avoid hypotension in a post CVA patient  TEE on 04/09/2021 negative as noted above with EF of 35 to 40%   11)Psych-[facility] evaluation appreciated psychiatric team reevaluated patient and recommended outpatient follow-up  --Patient remains medically stable and cleared for transfer to psychiatric service  Discharge Diagnoses:  Principal Problem:   Acute CVA (cerebral vascular accident) [facility]) Active Problems:   Anxiety and depression   Polysubstance abuse ([place])   Acute respiratory failure with hypoxia ([place])   OD (overdose of drug)   Hepatitis C antibody test positive   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   Discharge Instructions: Discharge Instructions     Ambulatory referral to Neurology   Complete by: As directed    An appointment is requested in approximately: 4 weeks   Ambulatory referral to Physical Therapy   Complete by: As directed    Ambulatory referral to Physical Therapy   Complete by: As directed       Allergies as of 04/10/2021  No Known Allergies      Medication List     STOP taking these medications    busPIRone 5 MG tablet Commonly known as: BUSPAR   escitalopram 20 MG  tablet Commonly known as: LEXAPRO       TAKE these medications    aspirin 81 MG EC tablet Take 1 tablet (81 mg total) by mouth daily. Swallow whole. Start taking on: April 11, 2021   carvedilol 3.125 MG tablet Commonly known as: COREG Take 1 tablet (3.125 mg total) by mouth 2 (two) times daily with a meal.   loperamide 2 MG capsule Commonly known as: IMODIUM Take 2 capsules (4 mg total) by mouth 3 (three) times daily as needed for diarrhea or loose stools.   multivitamin with minerals Tabs tablet Take 1 tablet by mouth daily. Start taking on: April 11, 2021   nystatin 100000 UNIT/ML suspension Commonly known as: MYCOSTATIN Take 5 mLs (500,000 Units total) by mouth 4 (four) times daily.   ondansetron 4 MG tablet Commonly known as: ZOFRAN Take 1 tablet (4 mg total) by mouth every 6 (six) hours as needed for nausea.   pantoprazole 40 MG tablet Commonly known as: PROTONIX Take 1 tablet (40 mg total) by mouth daily.   sacubitril-valsartan 24-26 MG Commonly known as: ENTRESTO Take 1 tablet by mouth 2 (two) times daily.        Follow-up Information     [facility] [place]. Call.   Specialty: Behavioral Health Why: Call for intake regarding substance abuse intensive outpatient program Contact information: [place] [id number] [place], [place]. Call.   Specialty: Behavioral Health Why: Call for an intake appointment regarding substance abuse intensive outpatient program Contact information: [place] [address] [phone]         [facility] Follow up.   Why: Should call you to schedule appointment. If you have not heard from them in several days, please call to schedule. Contact information: [address]        Ross, Paula V, MD Follow up on 05/15/2021.   Specialty:  Cardiology Why: Cardiology Hospital Follow-up on 05/15/2021 at 2:40 PM. Contact information: [address] [address] [phone]                No Known Allergies Allergies as of 04/10/2021   No Known Allergies      Medication List     STOP taking these medications    busPIRone 5 MG tablet Commonly known as: BUSPAR   escitalopram 20 MG tablet Commonly known as: LEXAPRO       TAKE these medications    aspirin 81 MG EC tablet Take 1 tablet (81 mg total) by mouth daily. Swallow whole. Start taking on: April 11, 2021   carvedilol 3.125 MG tablet Commonly known as: COREG Take 1 tablet (3.125 mg total) by mouth 2 (two) times daily with a meal.   loperamide 2 MG capsule Commonly known as: IMODIUM Take 2 capsules (4 mg total) by mouth 3 (three) times daily as needed for diarrhea or loose stools.   multivitamin with minerals Tabs tablet Take 1 tablet by mouth daily. Start taking on: April 11, 2021   nystatin 100000 UNIT/ML suspension Commonly known as: MYCOSTATIN Take 5 mLs (500,000 Units total) by mouth 4 (four) times daily.   ondansetron 4 MG tablet Commonly  known as: ZOFRAN Take 1 tablet (4 mg total) by mouth every 6 (six) hours as needed for nausea.   pantoprazole 40 MG tablet Commonly known as: PROTONIX Take 1 tablet (40 mg total) by mouth daily.   sacubitril-valsartan 24-26 MG Commonly known as: ENTRESTO Take 1 tablet by mouth 2 (two) times daily.        Procedures/Studies: DG Chest 2 View  Result Date: 04/07/2021 CLINICAL DATA:  Dyspnea EXAM: CHEST - 2 VIEW COMPARISON:  04/06/2021 FINDINGS: Two views study shows tiny bilateral pleural effusions, right greater than left. No edema or focal airspace consolidation. The cardiopericardial silhouette is within normal limits for size. The visualized bony structures of the thorax show no acute abnormality. Telemetry leads overlie the chest. IMPRESSION: Tiny bilateral pleural  effusions, right greater than left. Electronically Signed   By: Misty Stanley M.D.   On: 04/07/2021 13:43   CT Head Wo Contrast  Result Date: 04/05/2021 CLINICAL DATA:  26 year old male with history of persistent and recurrent dizziness. EXAM: CT HEAD WITHOUT CONTRAST TECHNIQUE: Contiguous axial images were obtained from the base of the skull through the vertex without intravenous contrast. RADIATION DOSE REDUCTION: This exam was performed according to the departmental dose-optimization program which includes automated exposure control, adjustment of the mA and/or kV according to patient size and/or use of iterative reconstruction technique. COMPARISON:  Head CT 01/25/2021. FINDINGS: Brain: No evidence of acute infarction, hemorrhage, hydrocephalus, extra-axial collection or mass lesion/mass effect. Vascular: No hyperdense vessel or unexpected calcification. Skull: Normal. Negative for fracture or focal lesion. Sinuses/Orbits: No acute finding. Other: None. IMPRESSION: No acute intracranial abnormalities. The appearance of the brain is normal. Electronically Signed   By: Vinnie Langton M.D.   On: 04/05/2021 10:41   MR ANGIO HEAD WO CONTRAST  Result Date: 04/05/2021 CLINICAL DATA:  Initial evaluation for acute stroke. EXAM: MRA NECK WITHOUT AND WITH CONTRAST MRA HEAD WITHOUT CONTRAST TECHNIQUE: Multiplanar and multiecho pulse sequences of the neck were obtained without and with intravenous contrast. Angiographic images of the neck were obtained using MRA technique without and with intravenous contrast; Angiographic images of the Circle of Willis were obtained using MRA technique without intravenous contrast. CONTRAST:  47mL GADAVIST GADOBUTROL 1 MMOL/ML IV SOLN COMPARISON:  Prior brain MRI from earlier the same day. FINDINGS: MRA NECK FINDINGS AORTIC ARCH: Visualized aortic arch normal caliber with normal branch pattern. No stenosis about the origin the great vessels. RIGHT CAROTID SYSTEM: Right common and  internal carotid arteries patent without stenosis or evidence for dissection. LEFT CAROTID SYSTEM: Left common and internal carotid arteries widely patent without stenosis or evidence for dissection. VERTEBRAL ARTERIES: Both vertebral arteries arise from the subclavian arteries. No proximal subclavian artery stenosis. Right vertebral artery slightly dominant. Vertebral arteries patent without stenosis or evidence for dissection. MRA HEAD FINDINGS ANTERIOR CIRCULATION: Both internal carotid arteries widely patent to the termini without stenosis or other abnormality. A1 segments widely patent. Normal anterior communicating artery complex. Anterior cerebral arteries patent without stenosis. No M1 stenosis or occlusion. Normal MCA bifurcations. Distal MCA branches perfused and symmetric. POSTERIOR CIRCULATION: Both vertebral arteries widely patent to the vertebrobasilar junction without stenosis. Right vertebral artery slightly dominant. Neither PICA origin well visualized. Basilar widely patent. Superior cerebral arteries patent. Both PCA supplied via the basilar as well as prominent bilateral posterior communicating arteries. Both PCAs well perfused or distal aspects without stenosis. No intracranial aneurysm. IMPRESSION: Normal MRA of the head and neck. No large vessel occlusion, hemodynamically significant stenosis, or other  acute vascular abnormality. Electronically Signed   By: Jeannine Boga M.D.   On: 04/05/2021 19:56   MR ANGIO NECK W WO CONTRAST  Result Date: 04/05/2021 CLINICAL DATA:  Initial evaluation for acute stroke. EXAM: MRA NECK WITHOUT AND WITH CONTRAST MRA HEAD WITHOUT CONTRAST TECHNIQUE: Multiplanar and multiecho pulse sequences of the neck were obtained without and with intravenous contrast. Angiographic images of the neck were obtained using MRA technique without and with intravenous contrast; Angiographic images of the Circle of Willis were obtained using MRA technique without  intravenous contrast. CONTRAST:  7m GADAVIST GADOBUTROL 1 MMOL/ML IV SOLN COMPARISON:  Prior brain MRI from earlier the same day. FINDINGS: MRA NECK FINDINGS AORTIC ARCH: Visualized aortic arch normal caliber with normal branch pattern. No stenosis about the origin the great vessels. RIGHT CAROTID SYSTEM: Right common and internal carotid arteries patent without stenosis or evidence for dissection. LEFT CAROTID SYSTEM: Left common and internal carotid arteries widely patent without stenosis or evidence for dissection. VERTEBRAL ARTERIES: Both vertebral arteries arise from the subclavian arteries. No proximal subclavian artery stenosis. Right vertebral artery slightly dominant. Vertebral arteries patent without stenosis or evidence for dissection. MRA HEAD FINDINGS ANTERIOR CIRCULATION: Both internal carotid arteries widely patent to the termini without stenosis or other abnormality. A1 segments widely patent. Normal anterior communicating artery complex. Anterior cerebral arteries patent without stenosis. No M1 stenosis or occlusion. Normal MCA bifurcations. Distal MCA branches perfused and symmetric. POSTERIOR CIRCULATION: Both vertebral arteries widely patent to the vertebrobasilar junction without stenosis. Right vertebral artery slightly dominant. Neither PICA origin well visualized. Basilar widely patent. Superior cerebral arteries patent. Both PCA supplied via the basilar as well as prominent bilateral posterior communicating arteries. Both PCAs well perfused or distal aspects without stenosis. No intracranial aneurysm. IMPRESSION: Normal MRA of the head and neck. No large vessel occlusion, hemodynamically significant stenosis, or other acute vascular abnormality. Electronically Signed   By: BJeannine BogaM.D.   On: 04/05/2021 19:56   MR BRAIN WO CONTRAST  Result Date: 04/05/2021 CLINICAL DATA:  Provided history: Mental status change, alcohol/drug use. Additional history provided: Patient found  unresponsive today. EXAM: MRI HEAD WITHOUT CONTRAST TECHNIQUE: Multiplanar, multiecho pulse sequences of the brain and surrounding structures were obtained without intravenous contrast. COMPARISON:  Head CT 04/05/2021.  Brain MRI 01/26/2021. FINDINGS: Brain: Cerebral volume is normal. Subcentimeter acute infarct within the mid-to-inferior left cerebellar hemisphere (series 5, image 8). No cortical encephalomalacia is identified. No significant cerebral white matter disease. No evidence of an intracranial mass. No chronic intracranial blood products. No extra-axial fluid collection. No midline shift. Mega cisterna magna. Vascular: Maintained flow voids within the proximal large arterial vessels. Skull and upper cervical spine: No focal suspicious marrow lesion. Sinuses/Orbits: Visualized orbits show no acute finding. Mild mucosal thickening within the bilateral ethmoid and sphenoid sinuses. Mild-to-moderate mucosal thickening within the bilateral maxillary sinuses. IMPRESSION: Subcentimeter acute infarct within the mid-to-inferior left cerebellar hemisphere. Otherwise unremarkable non-contrast MRI appearance of the brain. Paranasal sinus disease, as described. Electronically Signed   By: KKellie SimmeringD.O.   On: 04/05/2021 16:11   UKoreaVenous Img Lower Bilateral (DVT)  Result Date: 04/07/2021 CLINICAL DATA:  Lower extremity edema, right greater than EXAM: RIGHT LOWER EXTREMITY VENOUS DOPPLER ULTRASOUND TECHNIQUE: Gray-scale sonography with compression, as well as color and duplex ultrasound, were performed to evaluate the deep venous system(s) from the level of the common femoral vein through the popliteal and proximal calf veins. COMPARISON:  None. FINDINGS: VENOUS Normal compressibility of the  common femoral, superficial femoral, and popliteal veins, as well as the visualized calf veins. Visualized portions of profunda femoral vein and great saphenous vein unremarkable. No filling defects to suggest DVT on  grayscale or color Doppler imaging. Doppler waveforms show normal direction of venous flow, normal respiratory plasticity and response to augmentation. Limited views of the contralateral common femoral vein are unremarkable. OTHER Soft tissue edema. Limitations: none IMPRESSION: 1. Negative examination for deep venous thrombosis in the right lower extremity. 2. Soft tissue edema. Electronically Signed   By: Delanna Ahmadi M.D.   On: 04/07/2021 11:01   DG Chest Port 1 View  Result Date: 04/06/2021 CLINICAL DATA:  Shortness of breath EXAM: PORTABLE CHEST 1 VIEW COMPARISON:  04/05/2021 FINDINGS: Heart size is normal. Artifact overlies the chest. Allowing for that, the lungs appear clear. No infiltrate, collapse or effusion. No pulmonary edema. IMPRESSION: No active disease. Electronically Signed   By: Nelson Chimes M.D.   On: 04/06/2021 16:48   DG Chest Port 1 View  Result Date: 04/05/2021 CLINICAL DATA:  Cardiopulmonary arrest. Possible drug overdose. Questionable sepsis-evaluate for abnormality. EXAM: PORTABLE CHEST 1 VIEW COMPARISON:  Radiographs 03/25/2017 and 03/23/2017. FINDINGS: 1005 hours. The heart size and mediastinal contours are normal. The lungs are clear. There is no pleural effusion or pneumothorax. No acute osseous findings are identified. Telemetry leads overlie the chest. IMPRESSION: No evidence of active cardiopulmonary process. Electronically Signed   By: Richardean Sale M.D.   On: 04/05/2021 10:26   EEG adult  Result Date: 04/05/2021 Catha Gosselin, DO     04/05/2021  8:38 PM [facility] Routine EEG Report Patient Name:   Maruice, Pieroni Date of Birth:   1995-07-01 Identification Number:   MRN - [id number] Date of Study:   04/05/2021 17:41:27 Duration: 23 minutes Indication: Encephalopathy Technical Summary: A routine 20 channel electroencephalogram using the international 10-20 system of electrode placement was performed. Background: 5-6  Hz, Poorly formed States: Drowsy and Asleep - Vertex Waves, K Complexes were seen during asleep Abnormalities: - Generalized Slowing: Diffuse generalized slowing - Background Slowing: The background consists of 20-50 uV, 5-6 Hz diffuse activity with superimposed diffuse polymorphic delta activity that is non reactive to external stimulation. - When most awake this reaches 7-8 Hz. Activation Procedures: - Hyperventilation and Photic Stimulation were not performed Classification: Abnormal Clinical Correlation: - This EEG is abnormal due to mild generalized slowing. -  This is a technically limited study, throughout the recording he is asleep. - Generalized slowing is a nonspecific finding but is consistent with a generalized disturbance of cerebral function and may be seen in toxic, metabolic, post anoxic, multifocal or diffuse structural abnormalities. - No electrographic seizures were recorded. - Clinical correlation is recommended. Dr Annabelle Harman [facility] [phone] Case [id number]  ECHOCARDIOGRAM COMPLETE  Result Date: 04/06/2021    ECHOCARDIOGRAM REPORT   Patient Name:   BLADE SCHEFF Date of Exam: 04/06/2021 Medical Rec #:  [id number]    Height:       69.0 in Accession #:    [id number]   Weight:       187.8 lb Date of Birth:  01-Jun-1995    BSA:          2.012 m Patient Age:    25 years     BP:           100/69 mmHg Patient Gender: M            HR:  89 bpm. Exam Location:  [facility] Procedure: 2D Echo, Color Doppler and Cardiac Doppler Indications:     Stroke i63.9  History:         Patient has prior history of Echocardiogram examinations, most                  recent 03/25/2017. Polysubstance Abuse with OD.  Sonographer:     Raquel Sarna Senior RDCS Referring Phys:  [id number] COURAGE EMOKPAE Diagnosing Phys: Gwyndolyn Kaufman MD IMPRESSIONS  1. Left ventricular ejection fraction, by estimation, is 30 to 35%. The left ventricle has moderately decreased function. The left ventricle demonstrates global  hypokinesis. Indeterminate diastolic filling due to E-A fusion.  2. Right ventricular systolic function is mildly reduced. The right ventricular size is mildly enlarged. Tricuspid regurgitation signal is inadequate for assessing PA pressure.  3. The mitral valve is normal in structure. Trivial mitral valve regurgitation.  4. The aortic valve is tricuspid. Aortic valve regurgitation is not visualized. No aortic stenosis is present.  5. The inferior vena cava is dilated in size with <50% respiratory variability, suggesting right atrial pressure of 15 mmHg. Comparison(s): Compared to prior echo report in 2019, the EF has dropped from 60-65% to 30-35%. FINDINGS  Left Ventricle: Left ventricular ejection fraction, by estimation, is 30 to 35%. The left ventricle has moderately decreased function. The left ventricle demonstrates global hypokinesis. The left ventricular internal cavity size was normal in size. There is no left ventricular hypertrophy. Indeterminate diastolic filling due to E-A fusion. Right Ventricle: The right ventricular size is mildly enlarged. No increase in right ventricular wall thickness. Right ventricular systolic function is mildly reduced. Tricuspid regurgitation signal is inadequate for assessing PA pressure. Left Atrium: Left atrial size was normal in size. Right Atrium: Right atrial size was normal in size. Pericardium: There is no evidence of pericardial effusion. Mitral Valve: The mitral valve is normal in structure. Trivial mitral valve regurgitation. Tricuspid Valve: The tricuspid valve is normal in structure. Tricuspid valve regurgitation is trivial. Aortic Valve: The aortic valve is tricuspid. Aortic valve regurgitation is not visualized. No aortic stenosis is present. Pulmonic Valve: The pulmonic valve was normal in structure. Pulmonic valve regurgitation is trivial. Aorta: The aortic root is normal in size and structure. Venous: The inferior vena cava is dilated in size with less than  50% respiratory variability, suggesting right atrial pressure of 15 mmHg. IAS/Shunts: The atrial septum is grossly normal.  LEFT VENTRICLE PLAX 2D LVIDd:         5.50 cm      Diastology LVIDs:         4.50 cm      LV e' medial:    9.36 cm/s LV PW:         1.00 cm      LV E/e' medial:  4.9 LV IVS:        0.80 cm      LV e' lateral:   9.90 cm/s LVOT diam:     2.30 cm      LV E/e' lateral: 4.7 LV SV:         52 LV SV Index:   26 LVOT Area:     4.15 cm  LV Volumes (MOD) LV vol d, MOD A2C: 129.0 ml LV vol d, MOD A4C: 134.0 ml LV vol s, MOD A2C: 81.1 ml LV vol s, MOD A4C: 92.0 ml LV SV MOD A2C:     47.9 ml LV SV MOD A4C:  134.0 ml LV SV MOD BP:      50.6 ml RIGHT VENTRICLE RV S prime:     9.90 cm/s TAPSE (M-mode): 1.7 cm LEFT ATRIUM             Index        RIGHT ATRIUM           Index LA diam:        2.40 cm 1.19 cm/m   RA Area:     17.80 cm LA Vol (A2C):   28.4 ml 14.12 ml/m  RA Volume:   55.90 ml  27.79 ml/m LA Vol (A4C):   30.5 ml 15.16 ml/m LA Biplane Vol: 31.8 ml 15.81 ml/m  AORTIC VALVE LVOT Vmax:   75.70 cm/s LVOT Vmean:  57.900 cm/s LVOT VTI:    0.124 m  AORTA Ao Root diam: 3.30 cm Ao Asc diam:  2.70 cm MITRAL VALVE MV Area (PHT): 2.16 cm    SHUNTS MV Decel Time: 352 msec    Systemic VTI:  0.12 m MV E velocity: 46.30 cm/s  Systemic Diam: 2.30 cm MV A velocity: 39.80 cm/s MV E/A ratio:  1.16 Gwyndolyn Kaufman MD Electronically signed by Gwyndolyn Kaufman MD Signature Date/Time: 04/06/2021/6:12:54 PM    Final (Updated)    ECHO TEE  Result Date: 04/09/2021    TRANSESOPHOGEAL ECHO REPORT   Patient Name:   GERARD CANTARA Date of Exam: 04/09/2021 Medical Rec #:  [id number]    Height:       69.0 in Accession #:    [id number]   Weight:       184.7 lb Date of Birth:  1995-05-12    BSA:          1.998 m Patient Age:    25 years     BP:           129/76 mmHg Patient Gender: M            HR:           84 bpm. Exam Location:  [facility] Procedure: Transesophageal Echo, Cardiac Doppler and Color Doppler Indications:     Cerebral Infarction, unspecified I63.9  History:        Patient has prior history of Echocardiogram examinations, most                 recent 04/06/2021. Polysubstance Abuse with OD.  Sonographer:    Alvino Chapel RCS Referring Phys: [id number] [place]: The transesophogeal probe was passed without difficulty through the esophogus of the patient. Sedation performed by different physician. The patient developed no complications during the procedure. IMPRESSIONS  1. Left ventricular ejection fraction, by estimation, is 35 to 40%. The left ventricle has moderately decreased function. The left ventricle demonstrates global hypokinesis.  2. Right ventricular systolic function is low normal. The right ventricular size is normal.  3. No left atrial/left atrial appendage thrombus was detected. The LAA emptying velocity was 115 cm/s.  4. The pericardial effusion is circumferential.  5. The mitral valve is normal in structure. Trivial mitral valve regurgitation. No evidence of mitral stenosis.  6. The aortic valve is tricuspid. Aortic valve regurgitation is not visualized. No aortic stenosis is present.  7. Agitated saline contrast bubble study was negative, with no evidence of any interatrial shunt. FINDINGS  Left Ventricle: Left ventricular ejection fraction, by estimation, is 35 to 40%. The left ventricle has moderately decreased function. The left ventricle demonstrates global hypokinesis. The left ventricular internal cavity  size was normal in size. Right Ventricle: The right ventricular size is normal. No increase in right ventricular wall thickness. Right ventricular systolic function is low normal. Left Atrium: Left atrial size was normal in size. No left atrial/left atrial appendage thrombus was detected. The LAA emptying velocity was 115 cm/s. Right Atrium: Right atrial size was normal in size. Pericardium: Trivial pericardial effusion is present. The pericardial effusion is circumferential. Mitral  Valve: The mitral valve is normal in structure. Trivial mitral valve regurgitation. No evidence of mitral valve stenosis. Tricuspid Valve: The tricuspid valve is normal in structure. Tricuspid valve regurgitation is not demonstrated. No evidence of tricuspid stenosis. Aortic Valve: The aortic valve is tricuspid. Aortic valve regurgitation is not visualized. No aortic stenosis is present. Pulmonic Valve: The pulmonic valve was not well visualized. Pulmonic valve regurgitation is not visualized. No evidence of pulmonic stenosis. Aorta: No aortic plaque. The aortic root is normal in size and structure. IAS/Shunts: No atrial level shunt detected by color flow Doppler. Agitated saline contrast bubble study was negative, with no evidence of any interatrial shunt.   AORTA Ao Root diam: 3.30 cm Carlyle Dolly MD Electronically signed by Carlyle Dolly MD Signature Date/Time: 04/09/2021/2:58:09 PM    Final      Subjective: Pt reports he has been breathing much better on current medications.  He had constipation but had a hard bowel movement this morning.    Discharge Exam: Vitals:   04/10/21 0528 04/10/21 1234  BP: 140/76 127/78  Pulse: 88 88  Resp: 20 18  Temp: 98.2 F (36.8 C) 98.5 F (36.9 C)  SpO2: 99% 97%   Vitals:   04/09/21 1400 04/09/21 2153 04/10/21 0528 04/10/21 1234  BP: (!) 103/59 132/78 140/76 127/78  Pulse: 70 85 88 88  Resp: 15 20 20 18   Temp:  98.4 F (36.9 C) 98.2 F (36.8 C) 98.5 F (36.9 C)  TempSrc:  Oral Oral Oral  SpO2: 98% 97% 99% 97%  Weight:      Height:       General: Pt is alert, awake, not in acute distress Cardiovascular: normal S1/S2 +, no rubs, no gallops Respiratory: CTA bilaterally, no wheezing, no rhonchi Abdominal: Soft, NT, ND, bowel sounds + Extremities: no edema, no cyanosis   The results of significant diagnostics from this hospitalization (including imaging, microbiology, ancillary and laboratory) are listed below for reference.      Microbiology: Recent Results (from the past 240 hour(s))  Urine Culture     Status: None   Collection Time: 04/05/21  9:59 AM   Specimen: Urine, Catheterized  Result Value Ref Range Status   Specimen Description   Final    URINE, CATHETERIZED Performed at [facility], [address], [place], [place] [address]    Special Requests   Final    NONE Performed at [facility], [address] [place], [place] [address]    Culture   Final    NO GROWTH Performed at [facility], [place] [address] [place], [place] [address]    Report Status 04/07/2021 FINAL  Final  Blood Culture (routine x 2)     Status: None   Collection Time: 04/05/21 11:12 AM   Specimen: BLOOD RIGHT HAND  Result Value Ref Range Status   Specimen Description BLOOD RIGHT HAND BOTTLES DRAWN AEROBIC ONLY  Final   Special Requests Blood Culture adequate volume  Final   Culture   Final    NO GROWTH 5 DAYS Performed at [facility], [address], [place], [place] [address]    Report Status 04/10/2021 FINAL  Final  Blood Culture (routine x 2)     Status: None   Collection Time: 04/05/21 11:12 AM   Specimen: BLOOD LEFT HAND  Result Value Ref Range Status   Specimen Description BLOOD LEFT HAND BOTTLES DRAWN AEROBIC ONLY  Final   Special Requests Blood Culture adequate volume  Final   Culture   Final    NO GROWTH 5 DAYS Performed at [facility], [address] [place], [place] [address]    Report Status 04/10/2021 FINAL  Final  Resp Panel by RT-PCR (Flu A&B, Covid) Nasopharyngeal Swab     Status: None   Collection Time: 04/05/21  1:44 PM   Specimen: Nasopharyngeal Swab; Nasopharyngeal(NP) swabs in vial transport medium  Result Value Ref Range Status   SARS Coronavirus 2 by RT PCR NEGATIVE NEGATIVE Final    Comment: (NOTE) SARS-CoV-2 target nucleic acids are NOT DETECTED.  The SARS-CoV-2 RNA is generally detectable in upper respiratory specimens during the acute phase of infection. The  lowest concentration of SARS-CoV-2 viral copies this assay can detect is 138 copies/mL. A negative result does not preclude SARS-Cov-2 infection and should not be used as the sole basis for treatment or other patient management decisions. A negative result may occur with  improper specimen collection/handling, submission of specimen other than nasopharyngeal swab, presence of viral mutation(s) within the areas targeted by this assay, and inadequate number of viral copies(<138 copies/mL). A negative result must be combined with clinical observations, patient history, and epidemiological information. The expected result is Negative.  Fact Sheet for Patients:  EntrepreneurPulse.com.au  Fact Sheet for Healthcare Providers:  IncredibleEmployment.be  This test is no t yet approved or cleared by the [place] FDA and  has been authorized for detection and/or diagnosis of SARS-CoV-2 by FDA under an Emergency Use Authorization (EUA). This EUA will remain  in effect (meaning this test can be used) for the duration of the COVID-19 declaration under Section 564(b)(1) of the Act, 21 U.S.C.section 360bbb-3(b)(1), unless the authorization is terminated  or revoked sooner.       Influenza A by PCR NEGATIVE NEGATIVE Final   Influenza B by PCR NEGATIVE NEGATIVE Final    Comment: (NOTE) The Xpert Xpress SARS-CoV-2/FLU/RSV plus assay is intended as an aid in the diagnosis of influenza from Nasopharyngeal swab specimens and should not be used as a sole basis for treatment. Nasal washings and aspirates are unacceptable for Xpert Xpress SARS-CoV-2/FLU/RSV testing.  Fact Sheet for Patients: EntrepreneurPulse.com.au  Fact Sheet for Healthcare Providers: IncredibleEmployment.be  This test is not yet approved or cleared by the [place] FDA and has been authorized for detection and/or diagnosis of SARS-CoV-2 by FDA under  an Emergency Use Authorization (EUA). This EUA will remain in effect (meaning this test can be used) for the duration of the COVID-19 declaration under Section 564(b)(1) of the Act, 21 U.S.C. section 360bbb-3(b)(1), unless the authorization is terminated or revoked.  Performed at [facility], [address] [place], [place] [address]   MRSA Next Gen by PCR, Nasal     Status: Abnormal   Collection Time: 04/05/21  4:34 PM   Specimen: Nasal Mucosa; Nasal Swab  Result Value Ref Range Status   MRSA by PCR Next Gen DETECTED (A) NOT DETECTED Final    Comment: RESULT CALLED TO, READ BACK BY AND VERIFIED WITH: MANYARD,R AT 2130 ON 2.10.23 BY RUCINSKI,B (NOTE) The GeneXpert MRSA Assay (FDA  approved for NASAL specimens only), is one component of a comprehensive MRSA colonization surveillance program. It is not intended to diagnose MRSA infection nor to guide or monitor treatment for MRSA infections. Test performance is not FDA approved in patients less than 62 years old. Performed at [facility], [address], [place], [place] [address]      Labs: BNP (last 3 results) No results for input(s): BNP in the last 8760 hours. Basic Metabolic Panel: Recent Labs  Lab 04/05/21 1423 04/06/21 0548 04/07/21 0301 04/08/21 0432 04/09/21 0527 04/10/21 0608  NA 134* 133* 135  --  138 137  K 4.9 4.5 4.3  --  3.9 3.3*  CL 100 103 104  --  98 100  CO2 21* 23 25  --  27 30  GLUCOSE 207* 132* 108*  --  96 119*  BUN 12 19 12   --  8 8  CREATININE 1.70* 1.16 0.96 0.83 0.90 0.98  CALCIUM 7.8* 7.8* 8.0*  --  8.5* 8.4*  PHOS  --   --  1.5*  --   --   --    Liver Function Tests: Recent Labs  Lab 04/05/21 1112 04/06/21 0548 04/07/21 0301 04/09/21 0527 04/10/21 0608  AST 443* 1,424*  --  962* 544*  ALT 272* 479*  --  448* 341*  ALKPHOS 130* 66  --  58 51  BILITOT 1.3* 0.7  --  0.9 0.8  PROT 7.7 4.9*  --  5.5* 5.8*  ALBUMIN 4.6 2.9* 3.0* 3.2* 3.4*   No results for input(s): LIPASE, AMYLASE in  the last 168 hours. No results for input(s): AMMONIA in the last 168 hours. CBC: Recent Labs  Lab 04/05/21 1112 04/06/21 0548 04/07/21 0301 04/08/21 0432 04/09/21 0527 04/10/21 0608  WBC 25.3* 21.5* 10.1 7.2 9.0 12.4*  NEUTROABS 21.5*  --   --   --   --   --   HGB 15.5 15.1 13.4 12.7* 15.0 14.6  HCT 48.6 43.9 41.1 38.7* 45.0 41.3  MCV 100.4* 97.1 100.2* 94.4 93.2 93.4  PLT 285 178 128* 149* 173 153   Cardiac Enzymes: Recent Labs  Lab 04/06/21 0548 04/08/21 0432 04/09/21 0527 04/09/21 1529 04/10/21 0608  CKTOTAL 3,180* 17,226* 11,066* 7,926* 5,196*   BNP: Invalid input(s): POCBNP CBG: Recent Labs  Lab 04/09/21 1135 04/09/21 1619 04/09/21 2200 04/10/21 0533 04/10/21 1117  GLUCAP 104* 111* 86 105* 106*   D-Dimer No results for input(s): DDIMER in the last 72 hours. Hgb A1c No results for input(s): HGBA1C in the last 72 hours. Lipid Profile No results for input(s): CHOL, HDL, LDLCALC, TRIG, CHOLHDL, LDLDIRECT in the last 72 hours. Thyroid function studies No results for input(s): TSH, T4TOTAL, T3FREE, THYROIDAB in the last 72 hours.  Invalid input(s): FREET3 Anemia work up No results for input(s): VITAMINB12, FOLATE, FERRITIN, TIBC, IRON, RETICCTPCT in the last 72 hours. Urinalysis    Component Value Date/Time   COLORURINE YELLOW 04/05/2021 0959   APPEARANCEUR HAZY (A) 04/05/2021 0959   LABSPEC 1.014 04/05/2021 0959   PHURINE 6.0 04/05/2021 0959   GLUCOSEU 50 (A) 04/05/2021 0959   HGBUR LARGE (A) 04/05/2021 0959   BILIRUBINUR NEGATIVE 04/05/2021 0959   KETONESUR 20 (A) 04/05/2021 0959   PROTEINUR 100 (A) 04/05/2021 0959   NITRITE NEGATIVE 04/05/2021 0959   LEUKOCYTESUR NEGATIVE 04/05/2021 0959   Sepsis Labs Invalid input(s): PROCALCITONIN,  WBC,  LACTICIDVEN Microbiology Recent Results (from the past 240 hour(s))  Urine Culture     Status: None  Collection Time: 04/05/21  9:59 AM   Specimen: Urine, Catheterized  Result Value Ref Range Status    Specimen Description   Final    URINE, CATHETERIZED Performed at [facility], [address], [place], [place] [address]    Special Requests   Final    NONE Performed at [facility], [address], [place], [place] [address]    Culture   Final    NO GROWTH Performed at [facility], [place] [address] [place], [place] [address]    Report Status 04/07/2021 FINAL  Final  Blood Culture (routine x 2)     Status: None   Collection Time: 04/05/21 11:12 AM   Specimen: BLOOD RIGHT HAND  Result Value Ref Range Status   Specimen Description BLOOD RIGHT HAND BOTTLES DRAWN AEROBIC ONLY  Final   Special Requests Blood Culture adequate volume  Final   Culture   Final    NO GROWTH 5 DAYS Performed at [facility], [address] [place], [place] [address]    Report Status 04/10/2021 FINAL  Final  Blood Culture (routine x 2)     Status: None   Collection Time: 04/05/21 11:12 AM   Specimen: BLOOD LEFT HAND  Result Value Ref Range Status   Specimen Description BLOOD LEFT HAND BOTTLES DRAWN AEROBIC ONLY  Final   Special Requests Blood Culture adequate volume  Final   Culture   Final    NO GROWTH 5 DAYS Performed at [facility], [address] [place] [address], [place],  [address]    Report Status 04/10/2021 FINAL  Final  Resp Panel by RT-PCR (Flu A&B, Covid) Nasopharyngeal Swab     Status: None   Collection Time: 04/05/21  1:44 PM   Specimen: Nasopharyngeal Swab; Nasopharyngeal(NP) swabs in vial transport medium  Result Value Ref Range Status   SARS Coronavirus 2 by RT PCR NEGATIVE NEGATIVE Final    Comment: (NOTE) SARS-CoV-2 target nucleic acids are NOT DETECTED.  The SARS-CoV-2 RNA is generally detectable in upper respiratory specimens during the acute phase of infection. The lowest concentration of SARS-CoV-2 viral copies this assay can detect is 138 copies/mL. A negative result does not preclude SARS-Cov-2 infection and should not be used as the sole basis for treatment or other patient  management decisions. A negative result may occur with  improper specimen collection/handling, submission of specimen other than nasopharyngeal swab, presence of viral mutation(s) within the areas targeted by this assay, and inadequate number of viral copies(<138 copies/mL). A negative result must be combined with clinical observations, patient history, and epidemiological information. The expected result is Negative.  Fact Sheet for Patients:  EntrepreneurPulse.com.au  Fact Sheet for Healthcare Providers:  IncredibleEmployment.be  This test is no t yet approved or cleared by the [place] FDA and  has been authorized for detection and/or diagnosis of SARS-CoV-2 by FDA under an Emergency Use Authorization (EUA). This EUA will remain  in effect (meaning this test can be used) for the duration of the COVID-19 declaration under Section 564(b)(1) of the Act, 21 U.S.C.section 360bbb-3(b)(1), unless the authorization is terminated  or revoked sooner.       Influenza A by PCR NEGATIVE NEGATIVE Final   Influenza B by PCR NEGATIVE NEGATIVE Final    Comment: (NOTE) The Xpert Xpress SARS-CoV-2/FLU/RSV plus assay is intended as an aid in the diagnosis of influenza from Nasopharyngeal swab specimens and should not be used as a sole basis for treatment. Nasal washings and aspirates are unacceptable for Xpert Xpress  SARS-CoV-2/FLU/RSV testing.  Fact Sheet for Patients: EntrepreneurPulse.com.au  Fact Sheet for Healthcare Providers: IncredibleEmployment.be  This test is not yet approved or cleared by the [place] FDA and has been authorized for detection and/or diagnosis of SARS-CoV-2 by FDA under an Emergency Use Authorization (EUA). This EUA will remain in effect (meaning this test can be used) for the duration of the COVID-19 declaration under Section 564(b)(1) of the Act, 21 U.S.C. section 360bbb-3(b)(1),  unless the authorization is terminated or revoked.  Performed at [facility], [address] [place], [place] [address]   MRSA Next Gen by PCR, Nasal     Status: Abnormal   Collection Time: 04/05/21  4:34 PM   Specimen: Nasal Mucosa; Nasal Swab  Result Value Ref Range Status   MRSA by PCR Next Gen DETECTED (A) NOT DETECTED Final    Comment: RESULT CALLED TO, READ BACK BY AND VERIFIED WITH: MANYARD,R AT 2130 ON 2.10.23 BY RUCINSKI,B (NOTE) The GeneXpert MRSA Assay (FDA approved for NASAL specimens only), is one component of a comprehensive MRSA colonization surveillance program. It is not intended to diagnose MRSA infection nor to guide or monitor treatment for MRSA infections. Test performance is not FDA approved in patients less than 80 years old. Performed at [facility], [address] [place], [place] [address]     Time coordinating discharge:  41 mins   SIGNED:  Irwin Brakeman, MD  [facility] 04/10/2021, 1:08 PM How to contact the [facility] Attending or Consulting provider [place] or covering provider during after hours [place], for this patient?  Check the care team in [facility] and look for a) attending/consulting [facility] provider listed and b) the [facility] team listed Log into [email or web address] and use [place]'s universal password to access. If you do not have the password, please contact the hospital operator. Locate the [facility] provider you are looking for under [facility] and page to a number that you can be directly reached. If you still have difficulty reaching the provider, please page the [facility] (Director on Call) for the Hospitalists listed on amion for assistance.

## 2021-04-10 NOTE — Progress Notes (Signed)
Patiend discussed with primary team at time of TEE. Echo LVEF 30-35%, new finding for patient. 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 Would not consider cath at this time. Beta blocker in setting of cocaine use is debated and side effects are theoretical, I think a nonspecific beta blocker in this setting is reasonable. Start coreg  3.125 mg bid. Can start entresto 24/26mg  bid. Consider SGLT2i and aldactone at follow up if patient proves to be compliant. Would plan for 3-6 months medical therapy and then repeat echo. Needs to stop cocaine use. We will arrange outpatient f/u, no additoinal inpatient recs     Carlyle Dolly MD

## 2021-04-10 NOTE — TOC Transition Note (Signed)
Transition of Care [facility]) - CM/SW Discharge Note   Patient Details  Name: Horst Ostermiller MRN: [id number] Date of Birth: Feb 20, 1996  Transition of Care [facility]) CM/SW Contact:  Leitha Bleak, RN Phone Number: 04/10/2021, 11:32 AM   Clinical Narrative:   Patient discharging home agreeing to outpatient therapy. Gave patient the option on [place] or [place]. He has rather got to [place]. Orders placed. Patient has follow up appointments, All added to AVS.   Final next level of care: Home/Self Care Barriers to Discharge: Barriers Resolved  Patient Goals and CMS Choice Patient states their goals for this hospitalization and ongoing recovery are:: return home   Choice offered to / list presented to : Patient  Discharge Placement            Patient and family notified of of transfer: 04/10/21  Discharge Plan and Services      Readmission Risk Interventions Readmission Risk Prevention Plan 04/10/2021  Transportation Screening Complete  PCP or Specialist Appt within 5-7 Days Complete  Home Care Screening Complete  Medication Review (RN CM) Complete  Some recent data might be hidden

## 2021-04-16 ENCOUNTER — Ambulatory Visit: Payer: BC Managed Care – PPO | Attending: Audiologist | Admitting: Audiologist

## 2021-05-07 ENCOUNTER — Other Ambulatory Visit (HOSPITAL_COMMUNITY): Payer: Self-pay

## 2021-05-09 ENCOUNTER — Ambulatory Visit (INDEPENDENT_AMBULATORY_CARE_PROVIDER_SITE_OTHER): Payer: BC Managed Care – PPO | Admitting: Physician Assistant

## 2021-05-09 ENCOUNTER — Encounter: Payer: Self-pay | Admitting: Physician Assistant

## 2021-05-09 VITALS — BP 102/99 | HR 69 | Temp 98.0°F | Ht 69.25 in | Wt 176.2 lb

## 2021-05-09 DIAGNOSIS — R768 Other specified abnormal immunological findings in serum: Secondary | ICD-10-CM | POA: Diagnosis not present

## 2021-05-09 DIAGNOSIS — F32A Depression, unspecified: Secondary | ICD-10-CM

## 2021-05-09 DIAGNOSIS — R7989 Other specified abnormal findings of blood chemistry: Secondary | ICD-10-CM | POA: Diagnosis not present

## 2021-05-09 DIAGNOSIS — R748 Abnormal levels of other serum enzymes: Secondary | ICD-10-CM

## 2021-05-09 DIAGNOSIS — F1910 Other psychoactive substance abuse, uncomplicated: Secondary | ICD-10-CM | POA: Diagnosis not present

## 2021-05-09 DIAGNOSIS — F419 Anxiety disorder, unspecified: Secondary | ICD-10-CM | POA: Diagnosis not present

## 2021-05-09 DIAGNOSIS — R739 Hyperglycemia, unspecified: Secondary | ICD-10-CM | POA: Diagnosis not present

## 2021-05-09 LAB — CBC WITH DIFFERENTIAL/PLATELET
Basophils Absolute: 0.1 10*3/uL (ref 0.0–0.1)
Basophils Relative: 0.9 % (ref 0.0–3.0)
Eosinophils Absolute: 0.6 10*3/uL (ref 0.0–0.7)
Eosinophils Relative: 5.7 % — ABNORMAL HIGH (ref 0.0–5.0)
HCT: 40.2 % (ref 39.0–52.0)
Hemoglobin: 14 g/dL (ref 13.0–17.0)
Lymphocytes Relative: 20.9 % (ref 12.0–46.0)
Lymphs Abs: 2 10*3/uL (ref 0.7–4.0)
MCHC: 34.8 g/dL (ref 30.0–36.0)
MCV: 91.2 fl (ref 78.0–100.0)
Monocytes Absolute: 1 10*3/uL (ref 0.1–1.0)
Monocytes Relative: 9.8 % (ref 3.0–12.0)
Neutro Abs: 6.2 10*3/uL (ref 1.4–7.7)
Neutrophils Relative %: 62.7 % (ref 43.0–77.0)
Platelets: 213 10*3/uL (ref 150.0–400.0)
RBC: 4.4 Mil/uL (ref 4.22–5.81)
RDW: 14 % (ref 11.5–15.5)
WBC: 9.8 10*3/uL (ref 4.0–10.5)

## 2021-05-09 LAB — COMPREHENSIVE METABOLIC PANEL
ALT: 137 U/L — ABNORMAL HIGH (ref 0–53)
AST: 82 U/L — ABNORMAL HIGH (ref 0–37)
Albumin: 4.5 g/dL (ref 3.5–5.2)
Alkaline Phosphatase: 58 U/L (ref 39–117)
BUN: 11 mg/dL (ref 6–23)
CO2: 34 mEq/L — ABNORMAL HIGH (ref 19–32)
Calcium: 10 mg/dL (ref 8.4–10.5)
Chloride: 98 mEq/L (ref 96–112)
Creatinine, Ser: 0.89 mg/dL (ref 0.40–1.50)
GFR: 120.46 mL/min (ref >60.00)
Glucose, Bld: 98 mg/dL (ref 70–99)
Potassium: 4.8 mEq/L (ref 3.5–5.1)
Sodium: 136 mEq/L (ref 135–145)
Total Bilirubin: 0.4 mg/dL (ref 0.2–1.2)
Total Protein: 6.8 g/dL (ref 6.0–8.3)

## 2021-05-09 LAB — CK: Total CK: 64 U/L (ref 7–232)

## 2021-05-09 LAB — T4, FREE: Free T4: 0.65 ng/dL (ref 0.60–1.60)

## 2021-05-09 LAB — TSH: TSH: 1.41 u[IU]/mL (ref 0.35–5.50)

## 2021-05-09 NOTE — Progress Notes (Signed)
? ?Subjective:  ? ? Patient ID: Corey Rivera, male    DOB: [birth year], 26 y.o.   MRN: [id number] ? ?Chief Complaint  ?Patient presents with  ? Establish Care  ? ? ?HPI ?Patient is in today for 26 y.o. patient presents today for new patient establishment with me.  Patient has not had primary care in about 4 years. Currently looking for work. Living with his grandmother.  Patient has longstanding history of anxiety and depression, as well as polysubstance abuse.  He admits that he has had a hard life starting with losing his mother when he was just 26 years old and then lost his brother to a drug overdose December 2022.  Longstanding family history of alcohol abuse. ? ?Patient recently admitted to hospital on 04/05/2021 for 5 days after he was found unresponsive at home with respiratory distress and required Narcan on the scene.  His UDS was positive for cocaine, THC, benzodiazepines.  He suffered an acute cerebellar stroke with acute hearing loss.  He was also diagnosed with new cardiomyopathy on echo showed ejection fraction of 30 to 35% with global hypokinesis secondary to cocaine use.  His hepatitis C antibody test was positive as well and he was instructed to follow-up outpatient on this. ? ?TODAY: ?States his right side is back to normal in upper and lower extremities. Occ tingling feeling, but goes away after he is up and moving.  ? ?Sleep is "alright." Stays up for a day and then crashes he says.  ?Denies any substance or alcohol use since ED visit in February.  ? ?Mental health history: ?-PCP had him on Lexapro at one point and alprazolam to help him sleep (he was living alone and having bad panic attacks at night). Hasn't been on this in about 4 years. States he started having suicidal thoughts. ?-States he saw counseling at about age 26 or 26 ?-No consistent psychiatrist per patient  ?-Says work keeps his mind busy and helps him with goals and gives him purpose  ?-Took Concerta when he was younger ? ?Current  Care Team: ?Appts with cardiology and neurology upcoming  ? ? ?Past Medical History:  ?Diagnosis Date  ? Allergy   ? Anemia   ? Anxiety   ? Depression   ? Drug addiction (HCC)   ? GERD (gastroesophageal reflux disease)   ? History of chickenpox   ? Seizures (HCC)   ? ? ?Past Surgical History:  ?Procedure Laterality Date  ? NO PAST SURGERIES    ? TEE WITHOUT CARDIOVERSION N/A 04/09/2021  ? Procedure: TRANSESOPHAGEAL ECHOCARDIOGRAM (TEE);  Surgeon: Antoine Poche, MD;  Location: [facility];  Service: Endoscopy;  Laterality: N/A;  ? ? ?Family History  ?Problem Relation Age of Onset  ? Alcohol abuse Father   ? Alcohol abuse Brother   ? Asthma Brother   ? Diabetes Brother   ? Arthritis Maternal Grandmother   ? Asthma Maternal Grandmother   ? COPD Maternal Grandmother   ? Alcohol abuse Maternal Grandfather   ? Alcohol abuse Paternal Grandfather   ? Heart attack Paternal Grandfather   ? Hyperlipidemia Paternal Grandfather   ? ? ?Social History  ? ?Tobacco Use  ? Smoking status: Smoker, Current Status Unknown  ?  Packs/day: 0.00  ?  Types: Cigarettes  ? Smokeless tobacco: Never  ?Substance Use Topics  ? Alcohol use: Yes  ? Drug use: Yes  ?  Types: Cocaine, Marijuana  ?  Comment: Roxicodone  ?  ? ?No Known Allergies ? ?  Review of Systems ?NEGATIVE UNLESS OTHERWISE INDICATED IN HPI ? ? ?   ?Objective:  ?  ? ?BP (!) 102/99   Pulse 69   Temp 98 ?F (36.7 ?C)   Ht 5' 9.25" (1.759 m)   Wt 176 lb 4 oz (79.9 kg)   SpO2 95%   BMI 25.84 kg/m?  ? ?Wt Readings from Last 3 Encounters:  ?05/09/21 176 lb 4 oz (79.9 kg)  ?04/09/21 184 lb 11.9 oz (83.8 kg)  ?03/26/17 165 lb 9.6 oz (75.1 kg) (65 %, Z= 0.40)*  ? ?* Growth percentiles are based on CDC (Boys, 2-20 Years) data.  ? ? ?BP Readings from Last 3 Encounters:  ?05/09/21 (!) 102/99  ?04/10/21 127/78  ?03/25/17 (!) 147/90  ?  ? ?Physical Exam ?Vitals and nursing note reviewed.  ?Constitutional:   ?   General: He is not in acute distress. ?   Appearance: Normal appearance. He is not  toxic-appearing.  ?HENT:  ?   Head: Normocephalic and atraumatic.  ?   Right Ear: External ear normal.  ?   Left Ear: External ear normal.  ?   Nose: Nose normal.  ?   Mouth/Throat:  ?   Mouth: Mucous membranes are moist.  ?   Pharynx: Oropharynx is clear.  ?Eyes:  ?   Extraocular Movements: Extraocular movements intact.  ?   Conjunctiva/sclera: Conjunctivae normal.  ?   Pupils: Pupils are equal, round, and reactive to light.  ?Cardiovascular:  ?   Rate and Rhythm: Normal rate and regular rhythm.  ?   Pulses: Normal pulses.  ?   Heart sounds: Normal heart sounds.  ?Pulmonary:  ?   Effort: Pulmonary effort is normal.  ?   Breath sounds: Normal breath sounds.  ?Musculoskeletal:     ?   General: Normal range of motion.  ?   Cervical back: Normal range of motion and neck supple.  ?Skin: ?   General: Skin is warm and dry.  ?Neurological:  ?   General: No focal deficit present.  ?   Mental Status: He is alert and oriented to person, place, and time.  ?Psychiatric:     ?   Mood and Affect: Mood is depressed.  ? ? ?   ?Assessment & Plan:  ? ?Problem List Items Addressed This Visit   ? ?  ? Other  ? Hepatitis C antibody test positive (Chronic)  ? Relevant Orders  ? Hepatitis, Acute (Completed)  ? Anxiety and depression - Primary  ? Relevant Orders  ? Hepatitis, Acute (Completed)  ? Comprehensive metabolic panel (Completed)  ? CBC with Differential/Platelet (Completed)  ? TSH (Completed)  ? T4, free (Completed)  ? Ambulatory referral to Psychiatry  ? Polysubstance abuse (HCC)  ? Relevant Orders  ? CK (Creatine Kinase) (Completed)  ? TSH (Completed)  ? T4, free (Completed)  ? Ambulatory referral to Psychiatry  ? ?Other Visit Diagnoses   ? ? Elevated CK      ? Relevant Orders  ? CK (Creatine Kinase) (Completed)  ? Elevated liver function tests      ? Relevant Orders  ? Comprehensive metabolic panel (Completed)  ? Hyperglycemia      ? Relevant Orders  ? Comprehensive metabolic panel (Completed)  ? ?  ? ? ?Plan: ?-New patient  establishment with me.  I extensively reviewed patient's previous admission notes, labs, imaging.  He has neurology and cardiology follow-up within the next month. ?-I sent urgent request to be seen with psychiatry  as well. ?-Plan to repeat labs and treat accordingly. ?-Need to see if hepatitis C infection is active and also get him in with hepatology and/or infectious disease right away. ?-He says that he has stayed clear of all substances since February.  Encouraged him to continue this course.  He will need long-term support and assistance on this. ?-Encouraged patient to keep searching for a job as work seems to keep him motivated. ?-I plan to have close follow-up with him in the next 3 to 4 weeks. ? ?-He knows to reach out any sooner if needed. ? ?This note was prepared with assistance of Dragon voice recognition software. Occasional wrong-word or sound-a-like substitutions may have occurred due to the inherent limitations of voice recognition software. ? ? ?Kelani Robart M Pascual Mantel, PA-C ?

## 2021-05-09 NOTE — Patient Instructions (Addendum)
Good to meet you today! ?Please go to the lab for blood work and I will send results through MyChart. ?Referral sent to f/up with psychiatry asap. ?Continue to steer clear of all substance abuse.  ?

## 2021-05-14 ENCOUNTER — Other Ambulatory Visit: Payer: Self-pay | Admitting: Physician Assistant

## 2021-05-14 DIAGNOSIS — B1710 Acute hepatitis C without hepatic coma: Secondary | ICD-10-CM

## 2021-05-14 LAB — HEPATITIS PANEL, ACUTE
Hep A IgM: NONREACTIVE
Hep B C IgM: NONREACTIVE
Hepatitis B Surface Ag: NONREACTIVE
Hepatitis C Ab: REACTIVE — AB
SIGNAL TO CUT-OFF: >11 — ABNORMAL HIGH (ref <1.00)

## 2021-05-14 LAB — HCV RNA,QUANTITATIVE REAL TIME PCR
HCV Quantitative Log: 6.34 Log IU/mL — ABNORMAL HIGH
HCV RNA, PCR, QN: 2170000 IU/mL — ABNORMAL HIGH

## 2021-05-14 NOTE — Progress Notes (Signed)
? ?Cardiology Office Note ? ? ?Date:  05/14/2021  ? ?ID:  Corey Rivera, DOB 1995/11/08, MRN [id number] ? ?PCP:  Allwardt, Crist Infante, PA-C  ?Cardiologist:   Dietrich Pates, MD  ? ?Patient referred for f/u of CHF     ?  ?History of Present Illness: ?Corey Rivera is a 26 y.o. male with a history of cerebellar infarct, HTN, Hep C, seizures, drug abuse  ?Pt had a TEE done in Feb   LVEF 30 to 35%   Felt to be due to cocaine abuse   Cath deferred   Carvedilol and Entresto started.    F/U as outpt   Plan for repeat echo in several months    ? ?The pt denies ever having CP   Says his breathing is OK    ?Denies dizziness   No edema   No palpitations  ? ? ? ?Current Meds  ?Medication Sig  ? aspirin EC 81 MG EC tablet Take 1 tablet (81 mg total) by mouth daily. Swallow whole.  ? carvedilol (COREG) 3.125 MG tablet Take 1 tablet (3.125 mg total) by mouth 2 (two) times daily with a meal.  ? loperamide (IMODIUM) 2 MG capsule Take 2 capsules (4 mg total) by mouth 3 (three) times daily as needed for diarrhea or loose stools.  ? Multiple Vitamin (MULTIVITAMIN WITH MINERALS) TABS tablet Take 1 tablet by mouth daily.  ? ondansetron (ZOFRAN) 4 MG tablet Take 1 tablet (4 mg total) by mouth every 6 (six) hours as needed for nausea.  ? pantoprazole (PROTONIX) 40 MG tablet Take 1 tablet (40 mg total) by mouth daily.  ? sacubitril-valsartan (ENTRESTO) 24-26 MG Take 1 tablet by mouth 2 (two) times daily.  ? ? ? ?Allergies:   Patient has no known allergies.  ? ?Past Medical History:  ?Diagnosis Date  ? Allergy   ? Anemia   ? Anxiety   ? Depression   ? Drug addiction (HCC)   ? GERD (gastroesophageal reflux disease)   ? History of chickenpox   ? Seizures (HCC)   ? ? ?Past Surgical History:  ?Procedure Laterality Date  ? NO PAST SURGERIES    ? TEE WITHOUT CARDIOVERSION N/A 04/09/2021  ? Procedure: TRANSESOPHAGEAL ECHOCARDIOGRAM (TEE);  Surgeon: Antoine Poche, MD;  Location: [facility];  Service: Endoscopy;  Laterality: N/A;  ? ? ? ?Social History:  The  patient  reports that he has quit smoking. His smoking use included cigarettes. He has never used smokeless tobacco. He reports current alcohol use. He reports current drug use. Drugs: Cocaine and Marijuana.  ? ?Family History:  The patient's family history includes Alcohol abuse in his brother, father, maternal grandfather, and paternal grandfather; Arthritis in his maternal grandmother; Asthma in his brother and maternal grandmother; COPD in his maternal grandmother; Diabetes in his brother; Heart attack in his paternal grandfather; Hyperlipidemia in his paternal grandfather.  ? ? ?ROS:  Please see the history of present illness. All other systems are reviewed and  Negative to the above problem except as noted.  ? ? ?PHYSICAL EXAM: ?VS:  BP 102/80   Pulse 81   Ht 5\' 11"  (1.803 m)   Wt 172 lb 12.8 oz (78.4 kg)   SpO2 96%   BMI 24.10 kg/m?   ?GEN: Well nourished 26 yo , in no acute distress  ?HEENT: normal  ?Neck: no JVD, carotid bruits ?Cardiac: RRR; no murmurs,  No LE  edema  ?Respiratory:  clear to auscultation bilaterally ?GI: soft, nontender, nondistended, +  BS  No hepatomegaly  ?MS: no deformity Moving all extremities   ?Skin: warm and dry, no rash ?Neuro:  Strength and sensation are intact ?Psych: euthymic mood, full affect ? ? ?EKG:  EKG is not ordered today. ? ? ?Lipid Panel ?   ?Component Value Date/Time  ? CHOL 70 04/06/2021 0548  ? TRIG 18 04/06/2021 0548  ? HDL 36 (L) 04/06/2021 0548  ? CHOLHDL 1.9 04/06/2021 0548  ? VLDL 4 04/06/2021 0548  ? LDLCALC 30 04/06/2021 0548  ? ?  ? ?Wt Readings from Last 3 Encounters:  ?05/15/21 172 lb 12.8 oz (78.4 kg)  ?05/09/21 176 lb 4 oz (79.9 kg)  ?04/09/21 184 lb 11.9 oz (83.8 kg)  ?  ? ? ?ASSESSMENT AND PLAN: ? ?1  Chronic systolic CHF   Presumed due to drug use  ?Volume looks good  Tolerating meds    need to check Samuella Cota /coverage of entresto ?Add spironolactone 12.5    F/U BMET in 10 days    ? ?2  Sleep Pt has hard time initiating and maintaining sleep   Dicussed nighttime routine  Could try     Low dose beneadryl or melatonin SR   ? ?3  Cerelbellar infarct  Currently on aspirin ? ?4  Hx Hep C Will need to follow ? ?5  Substance abuse   Pt currently sober/not using  ? ?F/U in 1 month with  J Branch for further med titration.  ?Follow up echo this summer  ? ? ?Current medicines are reviewed at length with the patient today.  The patient does not have concerns regarding medicines. ? ?Signed, ?Dietrich Pates, MD  ?05/15/2021 3:21 PM    ?[facility] ?[address] [place], [place], [place]  [address] ?Phone: [phone]; Fax: [phone]  ? ? ? ? ? ? ? ?No outpatient medications have been marked as taking for the 05/15/21 encounter (Appointment) with Pricilla Riffle, MD.  ? ? ? ?Allergies:   Patient has no known allergies.  ? ?Past Medical History:  ?Diagnosis Date  ? Allergy   ? Anemia   ? Anxiety   ? Depression   ? Drug addiction (HCC)   ? GERD (gastroesophageal reflux disease)   ? History of chickenpox   ? Seizures (HCC)   ? ? ?Past Surgical History:  ?Procedure Laterality Date  ? NO PAST SURGERIES    ? TEE WITHOUT CARDIOVERSION N/A 04/09/2021  ? Procedure: TRANSESOPHAGEAL ECHOCARDIOGRAM (TEE);  Surgeon: Antoine Poche, MD;  Location: [facility];  Service: Endoscopy;  Laterality: N/A;  ? ? ? ?Social History:  The patient  reports that he has been smoking cigarettes. He has never used smokeless tobacco. He reports current alcohol use. He reports current drug use. Drugs: Cocaine and Marijuana.  ? ?Family History:  The patient's family history includes Alcohol abuse in his brother, father, maternal grandfather, and paternal grandfather; Arthritis in his maternal grandmother; Asthma in his brother and maternal grandmother; COPD in his maternal grandmother; Diabetes in his brother; Heart attack in his paternal grandfather; Hyperlipidemia in his paternal grandfather.  ? ? ?ROS:  Please see the history of present illness. All other systems are reviewed and   Negative to the above problem except as noted.  ? ? ?PHYSICAL EXAM: ?VS:  There were no vitals taken for this visit.  ?GEN: Well nourished, well developed, in no acute distress  ?HEENT: normal  ?Neck: no JVD, carotid bruits, or masses ?Cardiac: RRR; no murmurs, rubs,  or gallops,no edema  ?Respiratory:  clear to auscultation bilaterally, normal work of breathing ?GI: soft, nontender, nondistended, + BS  No hepatomegaly  ?MS: no deformity Moving all extremities   ?Skin: warm and dry, no rash ?Neuro:  Strength and sensation are intact ?Psych: euthymic mood, full affect ? ? ?EKG:  EKG is ordered today. ? ? ?Lipid Panel ?   ?Component Value Date/Time  ? CHOL 70 04/06/2021 0548  ? TRIG 18 04/06/2021 0548  ? HDL 36 (L) 04/06/2021 0548  ? CHOLHDL 1.9 04/06/2021 0548  ? VLDL 4 04/06/2021 0548  ? LDLCALC 30 04/06/2021 0548  ? ?  ? ?Wt Readings from Last 3 Encounters:  ?05/09/21 176 lb 4 oz (79.9 kg)  ?04/09/21 184 lb 11.9 oz (83.8 kg)  ?03/26/17 165 lb 9.6 oz (75.1 kg) (65 %, Z= 0.40)*  ? ?* Growth percentiles are based on CDC (Boys, 2-20 Years) data.  ?  ? ? ?ASSESSMENT AND PLAN: ? ? ? ? ?Current medicines are reviewed at length with the patient today.  The patient does not have concerns regarding medicines. ? ?Signed, ?Dietrich Pates, MD  ?05/14/2021 9:49 PM    ?[facility] ?[address] [place], [place], [place]  [address] ?Phone: [phone]; Fax: [phone]  ? ? ?

## 2021-05-15 ENCOUNTER — Other Ambulatory Visit: Payer: Self-pay

## 2021-05-15 ENCOUNTER — Encounter: Payer: Self-pay | Admitting: Internal Medicine

## 2021-05-15 ENCOUNTER — Ambulatory Visit (INDEPENDENT_AMBULATORY_CARE_PROVIDER_SITE_OTHER): Payer: BC Managed Care – PPO | Admitting: Internal Medicine

## 2021-05-15 VITALS — BP 102/80 | HR 81 | Ht 71.0 in | Wt 172.8 lb

## 2021-05-15 DIAGNOSIS — Z79899 Other long term (current) drug therapy: Secondary | ICD-10-CM | POA: Diagnosis not present

## 2021-05-15 MED ORDER — SPIRONOLACTONE 25 MG PO TABS: 12.5000 mg | ORAL_TABLET | Freq: Every day | ORAL | 3 refills | Status: DC

## 2021-05-15 NOTE — Patient Instructions (Addendum)
Medication Instructions:  ? ?Start Aldactone 12.5 mg Daily  ?You have been given samples of Entresto 24-26 mg Lot #[id number], Exp. 03/2023 ? ?*If you need a refill on your cardiac medications before your next appointment, please call your pharmacy* ? ? ?Lab Work: ?Your physician recommends that you return for lab work in: 10 Days  ? ?If you have labs (blood work) drawn today and your tests are completely normal, you will receive your results only by: ?MyChart Message (if you have MyChart) OR ?A paper copy in the mail ?If you have any lab test that is abnormal or we need to change your treatment, we will call you to review the results. ? ? ?Testing/Procedures: ?NONE  ? ? ?Follow-Up: ?At [facility], you and your health needs are our priority.  As part of our continuing mission to provide you with exceptional heart care, we have created designated Provider Care Teams.  These Care Teams include your primary Cardiologist (physician) and Advanced Practice Providers (APPs -  Physician Assistants and Nurse Practitioners) who all work together to provide you with the care you need, when you need it. ? ?We recommend signing up for the patient portal called "MyChart".  Sign up information is provided on this After Visit Summary.  MyChart is used to connect with patients for Virtual Visits (Telemedicine).  Patients are able to view lab/test results, encounter notes, upcoming appointments, etc.  Non-urgent messages can be sent to your provider as well.   ?To learn more about what you can do with MyChart, go to [email or web address].   ? ?Your next appointment:   ?1 month(s) ? ?The format for your next appointment:   ?In Person ? ?Provider:   ?Dina Rich, MD  ? ? ?Other Instructions ?Thank you for choosing [facility]! ?  ? ? ?

## 2021-05-16 ENCOUNTER — Encounter: Payer: Self-pay | Admitting: Internal Medicine

## 2021-05-16 ENCOUNTER — Telehealth: Payer: Self-pay | Admitting: Internal Medicine

## 2021-05-16 NOTE — Telephone Encounter (Signed)
New Message: ? ? ? ? ?Patient's grandmother called. She said patient saw Dr Harrington Challenger yesterday and she was supposed to have given him a clearance for his Dentist. Please fax to [phone] [facility]. She said his appointment is ion Monday(05-20-21). ?

## 2021-05-16 NOTE — Telephone Encounter (Signed)
Please advise if pt has clearance for dentist appt on 05/19/21. ?

## 2021-05-17 NOTE — Telephone Encounter (Signed)
Left message with receptionist at dental office, that we will need to have someone please call the office to clarify the procedure to be done. We will have to wait until we get the information from the dental office.  ?

## 2021-05-17 NOTE — Telephone Encounter (Signed)
? ?  Pt's grandmother calling, she said they need the clearance today before 3 pm  ?

## 2021-05-17 NOTE — Telephone Encounter (Signed)
Preoperative team, please contact requesting office and ask for details surrounding dental appointment.  We are not able to provide blanket coverage.  Once details surrounding procedure are received we will be able to provide recommendations.  Thank you for your help. ? ?Thomasene Ripple. Samad Thon NP-C ? ?  ?05/17/2021, 2:13 PM ?[facility] ?[address] ?Office [phone] Fax [phone] ? ?

## 2021-05-20 NOTE — Telephone Encounter (Signed)
Please forward to dentist ? ?Pt is OK to proceed with teeth cleaning and fillings ?There should be no need to stop Aspirin.   With cardiac Hx I would continue this without stopping  ?Please call with questions   [phone] ? ?Dorris Carnes, MID ?

## 2021-05-20 NOTE — Telephone Encounter (Signed)
Dental office called back and clarified the pt will be having 5 fillings under local. Dentist would like the advise of cardiology if felt that ASA needs to be held for fillings. There are no teeth being extracted. Please fax clearance to [phone]. PROCEDURE HAS BEEN CANCELLED FOR TODAY PER DENTAL OFFICE ? ? ? ?Pre-operative Risk Assessment  ?  ?Patient Name: Corey Rivera  ?DOB: [birth year] ?MRN: [id number]  ? ? ? ?Request for Surgical Clearance   ? ?Procedure:   5 FILLINGS ONLY; NO TEETH ARE BEING EXTRACTED ? ?Date of Surgery:  Clearance TBD                              ?   ?Surgeon:  DR. Broadus John ?Surgeon's Group or Practice Name:  [facility]  ?Phone number:  [phone] ?Fax number:  [phone] ?  ?Type of Clearance Requested:   ?- Medical ; DR. Broadus John WOULD LIKE ADVICE FROM CARDIOLOGY IF ASA NEEDS TO BE HELD FOR FILLINGS ?  ?Type of Anesthesia:  Local  ?  ?Additional requests/questions:   ? ?Signed, ?Julaine Hua   ?05/20/2021, 10:50 AM  ? ? ?

## 2021-05-20 NOTE — Telephone Encounter (Addendum)
? ?  Patient Name: Corey Rivera  ?DOB: 1995/05/16 ?MRN: [id number] ? ?Primary Cardiologist: Dr. Dina Rich previously, seen today by Dr. Dietrich Pates ? ?Chart reviewed as part of pre-operative protocol coverage. Patient seen in clinic today by Dr. Tenny Craw. Per her recommendations:  ? ?"Pt is OK to proceed with teeth cleaning and fillings ?There should be no need to stop Aspirin. With cardiac Hx I would continue this without stopping  ?Please call with questions   [phone]" ? ?Do not see indication for SBE prophylaxis either based on available history. ? ?Will route this bundled recommendation to requesting provider via Epic fax function. Please call with questions. ? ? ?Laurann Montana, PA-C ?05/20/2021, 3:19 PM ? ? ?

## 2021-05-24 ENCOUNTER — Encounter: Payer: Self-pay | Admitting: Family

## 2021-05-24 ENCOUNTER — Ambulatory Visit (INDEPENDENT_AMBULATORY_CARE_PROVIDER_SITE_OTHER): Payer: BC Managed Care – PPO | Admitting: Family

## 2021-05-24 ENCOUNTER — Other Ambulatory Visit: Payer: Self-pay

## 2021-05-24 VITALS — BP 135/81 | HR 99 | Temp 97.5°F | Wt 176.0 lb

## 2021-05-24 DIAGNOSIS — B182 Chronic viral hepatitis C: Secondary | ICD-10-CM | POA: Insufficient documentation

## 2021-05-24 HISTORY — DX: Chronic viral hepatitis C: B18.2

## 2021-05-24 NOTE — Progress Notes (Signed)
? ?Subjective:  ? ? Patient ID: Corey Rivera, male    DOB: Feb 24, 1996, 26 y.o.   MRN: [id number] ? ?Chief Complaint  ?Patient presents with  ? Hepatitis C  ? ? ?HPI: ? ?Corey Rivera is a 26 y.o. male with previous medical history significant for IV drug use, acute CVA, and anxiety/depression presenting today for evaluation and management of Hepatitis C. ? ?Corey Rivera was initially diagnosed in December 2022 with risk factors including IV drug use and tattoos. On 01/30/21 found to have Genotype 1a with Hepatitis C RNA level of 206,000. Has not received treatment to date. No personal or family history of liver disease. No current symptoms and denies abdominal pain, nausea, vomiting, fatigue, jaundice, or scleral icterus. Currently smokes tobacco with no recreational or illicit drug use or alcohol consumption. Most recent lab work completed on 05/09/21 with negative Hepatitis B without immunity and Hepatitis C RNA level of 2.17 million. HIV testing on 04/10/21 was negative.  ? ?No Known Allergies ? ? ? ?Outpatient Medications Prior to Visit  ?Medication Sig Dispense Refill  ? aspirin EC 81 MG EC tablet Take 1 tablet (81 mg total) by mouth daily. Swallow whole. 30 tablet 3  ? carvedilol (COREG) 3.125 MG tablet Take 1 tablet (3.125 mg total) by mouth 2 (two) times daily with a meal. 60 tablet 3  ? Multiple Vitamin (MULTIVITAMIN WITH MINERALS) TABS tablet Take 1 tablet by mouth daily.    ? ondansetron (ZOFRAN) 4 MG tablet Take 1 tablet (4 mg total) by mouth every 6 (six) hours as needed for nausea. 20 tablet 0  ? sacubitril-valsartan (ENTRESTO) 24-26 MG Take 1 tablet by mouth 2 (two) times daily. 60 tablet 3  ? spironolactone (ALDACTONE) 25 MG tablet Take 0.5 tablets (12.5 mg total) by mouth daily. 45 tablet 3  ? pantoprazole (PROTONIX) 40 MG tablet Take 1 tablet (40 mg total) by mouth daily. 30 tablet 3  ? loperamide (IMODIUM) 2 MG capsule Take 2 capsules (4 mg total) by mouth 3 (three) times daily as needed for diarrhea or  loose stools. (Patient not taking: Reported on 05/24/2021) 30 capsule 0  ? ?No facility-administered medications prior to visit.  ? ? ? ?Past Medical History:  ?Diagnosis Date  ? Allergy   ? Anemia   ? Anxiety   ? Depression   ? Drug addiction (HCC)   ? GERD (gastroesophageal reflux disease)   ? History of chickenpox   ? Seizures (HCC)   ? ? ? ?Past Surgical History:  ?Procedure Laterality Date  ? NO PAST SURGERIES    ? TEE WITHOUT CARDIOVERSION N/A 04/09/2021  ? Procedure: TRANSESOPHAGEAL ECHOCARDIOGRAM (TEE);  Surgeon: Antoine Poche, MD;  Location: [facility];  Service: Endoscopy;  Laterality: N/A;  ? ? ? ? ? ?Review of Systems  ?Constitutional:  Negative for chills, diaphoresis, fatigue and fever.  ?Respiratory:  Negative for cough, chest tightness, shortness of breath and wheezing.   ?Cardiovascular:  Negative for chest pain.  ?Gastrointestinal:  Negative for abdominal distention, abdominal pain, constipation, diarrhea, nausea and vomiting.  ?Neurological:  Negative for weakness and headaches.  ?Hematological:  Does not bruise/bleed easily.  ?   ?Objective:  ?  ?BP 135/81   Pulse 99   Temp (!) 97.5 ?F (36.4 ?C) (Oral)   Wt 176 lb (79.8 kg)   SpO2 100%   BMI 24.55 kg/m?  ?Nursing note and vital signs reviewed. ? ?Physical Exam ?Constitutional:   ?   General: He is not in  acute distress. ?   Appearance: He is well-developed.  ?Cardiovascular:  ?   Rate and Rhythm: Normal rate and regular rhythm.  ?   Heart sounds: Normal heart sounds. No murmur heard. ?  No friction rub. No gallop.  ?Pulmonary:  ?   Effort: Pulmonary effort is normal. No respiratory distress.  ?   Breath sounds: Normal breath sounds. No wheezing or rales.  ?Chest:  ?   Chest wall: No tenderness.  ?Abdominal:  ?   General: Bowel sounds are normal. There is no distension.  ?   Palpations: Abdomen is soft. There is no mass.  ?   Tenderness: There is no abdominal tenderness. There is no guarding or rebound.  ?Skin: ?   General: Skin is warm and  dry.  ?Neurological:  ?   Mental Status: He is alert and oriented to person, place, and time.  ?Psychiatric:     ?   Behavior: Behavior normal.     ?   Thought Content: Thought content normal.     ?   Judgment: Judgment normal.  ? ? ? ? ?  02/25/2017  ?  4:27 PM 01/24/2017  ? 12:38 PM  ?Depression screen PHQ 2/9  ?Decreased Interest 0 3  ?Down, Depressed, Hopeless 0 3  ?PHQ - 2 Score 0 6  ?Altered sleeping 2 0  ?Tired, decreased energy 0 0  ?Change in appetite 0 0  ?Feeling bad or failure about yourself  0 1  ?Trouble concentrating 0 1  ?Moving slowly or fidgety/restless 0 0  ?Suicidal thoughts 0 0  ?PHQ-9 Score 2 8  ?Difficult doing work/chores Somewhat difficult Somewhat difficult  ?  ?   ?Assessment & Plan:  ? ? ?Patient Active Problem List  ? Diagnosis Date Noted  ? Chronic hepatitis C without hepatic coma (HCC) 05/24/2021  ? OD (overdose of drug) 04/05/2021  ? Hepatitis C antibody test positive 04/05/2021  ? Acute CVA (cerebral vascular accident) (HCC) 04/05/2021  ? 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 Acute pulmonary edema (HCC) 03/24/2017  ? Seizure (HCC) 03/24/2017  ? Acute respiratory failure with hypoxia (HCC) 03/24/2017  ? Polysubstance abuse (HCC)   ? Anxiety and depression 01/24/2017  ? Gastroesophageal reflux disease without esophagitis 01/24/2017  ? ? ? ?Problem List Items Addressed This Visit   ? ?  ? Digestive  ? Chronic hepatitis C without hepatic coma (HCC) - Primary  ?  Corey Rivera is a 26 y/o male with Genotype 1a chronic Hepatitis C with initial RNA level of 2.17 million and risk factors of IV drug use and tattoos. Treatment naive and asymptomatic. Discussed the pathogenesis, risks if left untreated, transmission, treatments, financial assistance and treatment plan. Check liver fibrosis today. Plan for treatment pending lab work results. Follow up 1 month after starting medication.  ?  ?  ? Relevant Orders  ? Hepatic function panel  ? Liver Fibrosis, FibroTest-ActiTest  ?  Protime-INR  ? ? ? ?I have discontinued Remiel Senger's pantoprazole and loperamide. I am also having him maintain his sacubitril-valsartan, ondansetron, multivitamin with minerals, carvedilol, aspirin, and spironolactone. ? ? ?Follow-up: Pending lab work results and 1 month after starting medication.  ? ? ?Marcos Eke, MSN, FNP-C ?Nurse Practitioner ?[facility] ?[facility] ?[facility] Main number: [phone] ? ? ?

## 2021-05-24 NOTE — Assessment & Plan Note (Signed)
Mr. Mowers is a 26 y/o male with Genotype 1a chronic Hepatitis C with initial RNA level of 2.17 million and risk factors of IV drug use and tattoos. Treatment naive and asymptomatic. Discussed the pathogenesis, risks if left untreated, transmission, treatments, financial assistance and treatment plan. Check liver fibrosis today. Plan for treatment pending lab work results. Follow up 1 month after starting medication.  ?

## 2021-05-24 NOTE — Patient Instructions (Signed)
Nice to see you.  We will check your lab work today.  Plan for follow up 1 month after starting medication.   Have a great day and stay safe!  Limit acetaminophen (Tylenol) usage to no more than 2 grams (2,000 mg) per day.  Avoid alcohol.  Do not share toothbrushes or razors.  Practice safe sex to protect against transmission as well as sexually transmitted disease.    Hepatitis C Hepatitis C is a viral infection of the liver. It can lead to scarring of the liver (cirrhosis), liver failure, or liver cancer. Hepatitis C may go undetected for months or years because people with the infection may not have symptoms, or they may have only mild symptoms. What are the causes? This condition is caused by the hepatitis C virus (HCV). The virus can spread from person to person (is contagious) through: Blood. Childbirth. A woman who has hepatitis C can pass it to her baby during birth. Bodily fluids, such as breast milk, tears, semen, vaginal fluids, and saliva. Blood transfusions or organ transplants done in the [place] before 1992.  What increases the risk? The following factors may make you more likely to develop this condition: Having contact with unclean (contaminated) needles or syringes. This may result from: Acupuncture. Tattoing. Body piercing. Injecting drugs. Having unprotected sex with someone who is infected. Needing treatment to filter your blood (kidney dialysis). Having HIV (human immunodeficiency virus) or AIDS (acquired immunodeficiency syndrome). Working in a job that involves contact with blood or bodily fluids, such as health care.  What are the signs or symptoms? Symptoms of this condition include: Fatigue. Loss of appetite. Nausea. Vomiting. Abdominal pain. Dark yellow urine. Yellowish skin and eyes (jaundice). Itchy skin. Clay-colored bowel movements. Joint pain. Bleeding and bruising easily. Fluid building up in your stomach (ascites).  In some  cases, you may not have any symptoms. How is this diagnosed? This condition is diagnosed with: Blood tests. Other tests to check how well your liver is functioning. They may include: Magnetic resonance elastography (MRE). This imaging test uses MRIs and sound waves to measure liver stiffness. Transient elastography. This imaging test uses ultrasounds to measure liver stiffness. Liver biopsy. This test requires taking a small tissue sample from your liver to examine it under a microscope.  How is this treated? Your health care provider may perform noninvasive tests or a liver biopsy to help decide the best course of treatment. Treatment may include: Antiviral medicines and other medicines. Follow-up treatments every 6-12 months for infections or other liver conditions. Receiving a donated liver (liver transplant).  Follow these instructions at home: Medicines Take over-the-counter and prescription medicines only as told by your health care provider. Take your antiviral medicine as told by your health care provider. Do not stop taking the antiviral even if you start to feel better. Do not take any medicines unless approved by your health care provider, including over-the-counter medicines and birth control pills. Activity Rest as needed. Do not have sex unless approved by your health care provider. Ask your health care provider when you may return to school or work. Eating and drinking Eat a balanced diet with plenty of fruits and vegetables, whole grains, and lowfat (lean) meats or non-meat proteins (such as beans or tofu). Drink enough fluids to keep your urine clear or pale yellow. Do not drink alcohol. General instructions Do not share toothbrushes, nail clippers, or razors. Wash your hands frequently with soap and water. If soap and water are not available,   use hand sanitizer. Cover any cuts or open sores on your skin to prevent spreading the virus. Keep all follow-up visits as  told by your health care provider. This is important. You may need follow-up visits every 6-12 months. How is this prevented? There is no vaccine for hepatitis C. The only way to prevent the disease is to reduce the risk of exposure to the virus. Make sure you: Wash your hands frequently with soap and water. If soap and water are not available, use hand sanitizer. Do not share needles or syringes. Practice safe sex and use condoms. Avoid handling blood or bodily fluids without gloves or other protection. Avoid getting tattoos or piercings in shops or other locations that are not clean.  Contact a health care provider if: You have a fever. You develop abdominal pain. You pass dark urine. You pass clay-colored stools. You develop joint pain. Get help right away if: You have increasing fatigue or weakness. You lose your appetite. You cannot eat or drink without vomiting. You develop jaundice or your jaundice gets worse. You bruise or bleed easily. Summary Hepatitis C is a viral infection of the liver. It can lead to scarring of the liver (cirrhosis), liver failure, or liver cancer. The hepatitis C virus (HCV) causes this condition. The virus can pass from person to person (is contagious). You should not take any medicines unless approved by your health care provider. This includes over-the-counter medicines and birth control pills. This information is not intended to replace advice given to you by your health care provider. Make sure you discuss any questions you have with your health care provider. Document Released: 02/08/2000 Document Revised: 03/18/2016 Document Reviewed: 03/18/2016 Elsevier Interactive Patient Education  2018 Elsevier Inc[employer]  

## 2021-05-29 ENCOUNTER — Telehealth: Payer: Self-pay

## 2021-05-29 ENCOUNTER — Other Ambulatory Visit (HOSPITAL_COMMUNITY): Payer: Self-pay

## 2021-05-29 LAB — LIVER FIBROSIS, FIBROTEST-ACTITEST
ALT: 142 U/L — ABNORMAL HIGH (ref 9–46)
Alpha-2-Macroglobulin: 187 mg/dL (ref 106–279)
Apolipoprotein A1: 115 mg/dL (ref 94–176)
Bilirubin: 0.5 mg/dL (ref 0.2–1.2)
Fibrosis Score: 0.19
GGT: 28 U/L (ref 3–70)
Haptoglobin: 76 mg/dL (ref 43–212)
Necroinflammat ACT Score: 0.67
Reference ID: 4304947

## 2021-05-29 LAB — HEPATIC FUNCTION PANEL
AG Ratio: 1.9 (calc) (ref 1.0–2.5)
ALT: 147 U/L — ABNORMAL HIGH (ref 9–46)
AST: 66 U/L — ABNORMAL HIGH (ref 10–40)
Albumin: 4.8 g/dL (ref 3.6–5.1)
Alkaline phosphatase (APISO): 51 U/L (ref 36–130)
Bilirubin, Direct: 0.1 mg/dL (ref 0.0–0.2)
Globulin: 2.5 g/dL (calc) (ref 1.9–3.7)
Indirect Bilirubin: 0.4 mg/dL (calc) (ref 0.2–1.2)
Total Bilirubin: 0.5 mg/dL (ref 0.2–1.2)
Total Protein: 7.3 g/dL (ref 6.1–8.1)

## 2021-05-29 LAB — PROTIME-INR
INR: 1.1
Prothrombin Time: 11.5 s (ref 9.0–11.5)

## 2021-05-29 NOTE — Telephone Encounter (Signed)
[facility] Patient Advocate Encounter ?  ?Received notification from [facility] that prior authorization for Mavyret is required. ?  ?PA submitted on 05/29/21 ?Key [id number] ?Status is pending ?   ?[facility] will continue to follow. ? ? ?Clearance Coots, CPhT ?Specialty Pharmacy Patient Advocate ?[facility] ?Phone: [phone] ?Fax:  [phone]  ?

## 2021-05-30 ENCOUNTER — Other Ambulatory Visit (HOSPITAL_COMMUNITY): Payer: Self-pay

## 2021-05-31 ENCOUNTER — Other Ambulatory Visit (HOSPITAL_COMMUNITY): Payer: Self-pay

## 2021-06-03 ENCOUNTER — Other Ambulatory Visit: Payer: Self-pay | Admitting: Pharmacist

## 2021-06-03 ENCOUNTER — Telehealth: Payer: Self-pay

## 2021-06-03 ENCOUNTER — Other Ambulatory Visit (HOSPITAL_COMMUNITY): Payer: Self-pay

## 2021-06-03 DIAGNOSIS — B182 Chronic viral hepatitis C: Secondary | ICD-10-CM

## 2021-06-03 MED ORDER — MAVYRET 100-40 MG PO TABS
3.0000 | ORAL_TABLET | Freq: Every day | ORAL | 1 refills | Status: DC
  Filled 2021-06-03: qty 84, fill #0
  Filled 2021-06-04: qty 84, 28d supply, fill #0
  Filled 2021-06-26: qty 84, 28d supply, fill #1

## 2021-06-03 NOTE — Telephone Encounter (Signed)
[facility] Patient Advocate Encounter ? ?Prior Authorization for [employer] has been approved.   ? ?PA# [id number] ?Effective dates: 05/29/21 through 11/28/21 ? ?Patients co-pay is $0.00.  ? ?Prescription can be filled at [facility]. ? ?[facility] will continue to follow. ? ?Clearance Coots, CPhT ?Specialty Pharmacy Patient Advocate ?[facility] ?Phone: [phone] ?Fax:  [phone]  ?

## 2021-06-04 ENCOUNTER — Other Ambulatory Visit (HOSPITAL_COMMUNITY): Payer: Self-pay

## 2021-06-05 ENCOUNTER — Other Ambulatory Visit (HOSPITAL_COMMUNITY): Payer: Self-pay

## 2021-06-10 ENCOUNTER — Ambulatory Visit: Payer: BC Managed Care – PPO | Admitting: Diagnostic Neuroimaging

## 2021-06-11 ENCOUNTER — Encounter: Payer: Self-pay | Admitting: Diagnostic Neuroimaging

## 2021-06-14 ENCOUNTER — Telehealth: Payer: Self-pay

## 2021-06-24 ENCOUNTER — Other Ambulatory Visit (HOSPITAL_COMMUNITY): Payer: Self-pay

## 2021-06-26 ENCOUNTER — Ambulatory Visit: Payer: BC Managed Care – PPO | Admitting: Physician Assistant

## 2021-06-26 ENCOUNTER — Other Ambulatory Visit (HOSPITAL_COMMUNITY): Payer: Self-pay

## 2021-06-28 ENCOUNTER — Other Ambulatory Visit (HOSPITAL_COMMUNITY): Payer: Self-pay

## 2021-07-01 ENCOUNTER — Other Ambulatory Visit (HOSPITAL_COMMUNITY): Payer: Self-pay

## 2021-07-04 ENCOUNTER — Other Ambulatory Visit (HOSPITAL_COMMUNITY): Payer: Self-pay

## 2021-07-08 ENCOUNTER — Ambulatory Visit: Payer: BC Managed Care – PPO | Admitting: Pharmacist

## 2021-07-09 ENCOUNTER — Ambulatory Visit: Payer: BC Managed Care – PPO | Admitting: Pharmacist

## 2021-07-15 ENCOUNTER — Ambulatory Visit: Payer: BC Managed Care – PPO | Admitting: Cardiology

## 2021-07-23 ENCOUNTER — Other Ambulatory Visit (HOSPITAL_COMMUNITY): Payer: Self-pay

## 2021-07-25 ENCOUNTER — Other Ambulatory Visit: Payer: Self-pay

## 2021-07-25 ENCOUNTER — Ambulatory Visit (INDEPENDENT_AMBULATORY_CARE_PROVIDER_SITE_OTHER): Payer: Self-pay | Admitting: Pharmacist

## 2021-07-25 DIAGNOSIS — B182 Chronic viral hepatitis C: Secondary | ICD-10-CM

## 2021-07-25 NOTE — Progress Notes (Signed)
07/25/2021  HPI: Corey Rivera is a 26 y.o. male who presents to the [facility] pharmacy clinic for Hepatitis C follow-up.  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 Swallow whole.   CARVEDILOL (COREG) 3.125 MG TABLET    Take 1 tablet (3.125 mg total) by mouth 2 (two) times daily with a meal.   GLECAPREVIR-PIBRENTASVIR (MAVYRET) 100-40 MG TABS    Take 3 tablets by mouth daily with breakfast.   MULTIPLE VITAMIN (MULTIVITAMIN WITH MINERALS) TABS TABLET    Take 1 tablet by mouth daily.   ONDANSETRON (ZOFRAN) 4 MG TABLET    Take 1 tablet (4 mg total) by mouth every 6 (six) hours as needed for nausea.   SACUBITRIL-VALSARTAN (ENTRESTO) 24-26 MG    Take 1 tablet by mouth 2 (two) times daily.   SPIRONOLACTONE (ALDACTONE) 25 MG TABLET    Take 0.5 tablets (12.5 mg total) by mouth daily.  Modified Medications   No medications on file  Discontinued Medications   No medications on file    Allergies: No Known Allergies  Past Medical History: Past Medical History:  Diagnosis Date   Allergy    Anemia    Anxiety    Depression    Drug addiction (HCC)    GERD (gastroesophageal reflux disease)     History of chickenpox    Seizures (HCC)     Social History: Social History   Socioeconomic History   Marital status: Single    Spouse name: Not on file   Number of children: Not on file   Years of education: Not on file   Highest education level: Not on file  Occupational History   Not on file  Tobacco Use   Smoking status: Every Day    Packs/day: 0.00    Types: Cigarettes   Smokeless tobacco: Never  Substance and Sexual Activity   Alcohol use: Not Currently   Drug use: Not Currently    Types: Cocaine, Marijuana    Comment: Roxicodone   Sexual activity: Yes  Other Topics Concern   Not on file  Social History Narrative   Not on file   Social Determinants of Health   Financial Resource Strain: Not on file  Food Insecurity: Not on file  Transportation Needs: Not on file  Physical Activity: Not on file  Stress: Not on file  Social Connections: Not on file    Labs: Hepatitis C Lab Results  Component Value Date   HEPCAB REACTIVE (A) 05/09/2021   HCVRNAPCRQN 2,170,000 (H) 05/09/2021   FIBROSTAGE F0 05/24/2021   Hepatitis B Lab Results  Component Value Date   HEPBSAG NON-REACTIVE 05/09/2021   Hepatitis  A No results found for: HAV HIV Lab Results  Component Value Date   HIV Non Reactive 04/05/2021   HIV Non Reactive 03/24/2017   Lab Results  Component Value Date   CREATININE 0.89 05/09/2021   CREATININE 0.98 04/10/2021   CREATININE 0.90 04/09/2021   CREATININE 0.83 04/08/2021   CREATININE 0.96 04/07/2021   Lab Results  Component Value Date   AST 66 (H) 05/24/2021   AST 82 (H) 05/09/2021   AST 544 (H) 04/10/2021   ALT 147 (H) 05/24/2021   ALT 142 (H) 05/24/2021   ALT 137 (H) 05/09/2021   INR 1.1 05/24/2021   INR 1.3 (H) 04/05/2021   INR 1.00 03/23/2017    Assessment: Corey Rivera presents today to follow up for his chronic Hepatitis C infection. He started 8 weeks of Mavyret about a month and a half ago. He is having no issues with side effects but  has missed 2-3 doses due to his girlfriend having a baby this past weekend. He left his Mavyret at home when they were in a rush to get to the hospital. No other missed doses. He has about 1-2 weeks left. Encouraged him to finish out his course and not miss any more doses. Will check labs today and have him come back in 3 months to see Corey Rivera for his cure visit.    Plan: - Continue Mavyret for 8 weeks - Hep C RNA + CMET today - F/u with Greg for SVR visit on 11/26/21  Corey Rivera, PharmD, BCIDP, AAHIVP, CPP Clinical Pharmacist Practitioner Infectious Diseases Clinical Pharmacist [facility] 07/25/2021, 2:32 PM

## 2021-07-29 LAB — COMPREHENSIVE METABOLIC PANEL
AG Ratio: 2 (calc) (ref 1.0–2.5)
ALT: 9 U/L (ref 9–46)
AST: 12 U/L (ref 10–40)
Albumin: 4.7 g/dL (ref 3.6–5.1)
Alkaline phosphatase (APISO): 56 U/L (ref 36–130)
BUN: 18 mg/dL (ref 7–25)
CO2: 29 mmol/L (ref 20–32)
Calcium: 10.2 mg/dL (ref 8.6–10.3)
Chloride: 102 mmol/L (ref 98–110)
Creat: 0.91 mg/dL (ref 0.60–1.24)
Globulin: 2.4 g/dL (calc) (ref 1.9–3.7)
Glucose, Bld: 77 mg/dL (ref 65–99)
Potassium: 4.3 mmol/L (ref 3.5–5.3)
Sodium: 140 mmol/L (ref 135–146)
Total Bilirubin: 0.3 mg/dL (ref 0.2–1.2)
Total Protein: 7.1 g/dL (ref 6.1–8.1)

## 2021-07-29 LAB — HEPATITIS C RNA QUANTITATIVE
HCV Quantitative Log: <1.18 log IU/mL
HCV RNA, PCR, QN: <15 IU/mL

## 2021-09-04 NOTE — Telephone Encounter (Signed)
Called and spoke with patient regarding his HCV medication, Mavyret. Counseled on dosage/administration, and side effects. He reports good adherence and no issues with tolerance. He knows to call if he has any questions; will follow-up in clinic on 5/15.

## 2021-11-18 ENCOUNTER — Encounter: Payer: Self-pay | Admitting: *Deleted

## 2021-11-26 ENCOUNTER — Other Ambulatory Visit: Payer: Self-pay

## 2021-11-26 ENCOUNTER — Ambulatory Visit: Payer: Self-pay | Admitting: Family

## 2021-11-26 ENCOUNTER — Ambulatory Visit (INDEPENDENT_AMBULATORY_CARE_PROVIDER_SITE_OTHER): Payer: 59 | Admitting: Family

## 2021-11-26 ENCOUNTER — Encounter: Payer: Self-pay | Admitting: Family

## 2021-11-26 VITALS — BP 132/85 | HR 120 | Temp 97.9°F | Resp 16 | Ht 71.0 in | Wt 202.4 lb

## 2021-11-26 DIAGNOSIS — B182 Chronic viral hepatitis C: Secondary | ICD-10-CM | POA: Diagnosis not present

## 2021-11-26 NOTE — Assessment & Plan Note (Signed)
Corey Rivera has completed treatment for chronic hepatitis C with no adverse side effects and feeling well. Check Hepatitis C RNA level today for sustained viremic response. No future hepatocellular carcinoma screening is needed. If future screening for hepatitis C is required will need Hepatitis C RNA level check as Hepatitis C antibody will always be positive. Follow up with ID as needed pending lab work results.

## 2021-11-26 NOTE — Patient Instructions (Addendum)
Nice to see you.  We will check your lab work today.  No further liver cancer screening is needed.  If you need to be tested for Hepatitis C in the future a Hepatitis C RNA level or viral load will need to be checked as the Hepatitis C antibody used for initial screening will always be positive.   Have a great day and stay safe!  

## 2021-11-26 NOTE — Progress Notes (Signed)
Subjective:    Patient ID: Corey Rivera, male    DOB: 08/02/1995, 26 y.o.   MRN: [id number]  Chief Complaint  Patient presents with   Follow-up    Chronic hepatitis C without hepatic coma    Hepatitis C    HPI:  Corey Rivera is a 26 y.o. male with chronic Hepatitis C last seen o n 07/25/21 by Magda Kiel, PharmD, CPP with good adherence and tolerance to [place]. Hepatitis C RNA level was undetectable. Here today for cure/SVR12 visit.   Corey Rivera has been doing well since his last office visit. Feeling well today with no new concerns/complaints. Completed treatment with no adverse side effects. Denies abdominal pain, nausea, vomiting, fatigue, fever, scleral icterus or jaundice.    No Known Allergies    Outpatient Medications Prior to Visit  Medication Sig Dispense Refill   aspirin EC 81 MG EC tablet Take 1 tablet (81 mg total) by mouth daily. Swallow whole. (Patient not taking: Reported on 11/26/2021) 30 tablet 3   carvedilol (COREG) 3.125 MG tablet Take 1 tablet (3.125 mg total) by mouth 2 (two) times daily with a meal. (Patient not taking: Reported on 11/26/2021) 60 tablet 3   Glecaprevir-Pibrentasvir (MAVYRET) 100-40 MG TABS Take 3 tablets by mouth daily with breakfast. (Patient not taking: Reported on 11/26/2021) 84 tablet 1   Multiple Vitamin (MULTIVITAMIN WITH MINERALS) TABS tablet Take 1 tablet by mouth daily. (Patient not taking: Reported on 11/26/2021)     ondansetron (ZOFRAN) 4 MG tablet Take 1 tablet (4 mg total) by mouth every 6 (six) hours as needed for nausea. (Patient not taking: Reported on 11/26/2021) 20 tablet 0   sacubitril-valsartan (ENTRESTO) 24-26 MG Take 1 tablet by mouth 2 (two) times daily. (Patient not taking: Reported on 11/26/2021) 60 tablet 3   spironolactone (ALDACTONE) 25 MG tablet Take 0.5 tablets (12.5 mg total) by mouth daily. (Patient not taking: Reported on 11/26/2021) 45 tablet 3   No facility-administered medications prior to visit.     Past  Medical History:  Diagnosis Date   Allergy    Anemia    Anxiety    Depression    Drug addiction ([place])    GERD (gastroesophageal reflux disease)    History of chickenpox    Seizures ([place])      Past Surgical History:  Procedure Laterality Date   NO PAST SURGERIES     TEE WITHOUT CARDIOVERSION N/A 04/09/2021   Procedure: TRANSESOPHAGEAL ECHOCARDIOGRAM (TEE);  Surgeon: Arnoldo Lenis, MD;  Location: [facility];  Service: Endoscopy;  Laterality: N/A;       Review of Systems  Constitutional:  Negative for chills, diaphoresis, fatigue and fever.  Respiratory:  Negative for cough, chest tightness, shortness of breath and wheezing.   Cardiovascular:  Negative for chest pain.  Gastrointestinal:  Negative for abdominal distention, abdominal pain, constipation, diarrhea, nausea and vomiting.  Neurological:  Negative for weakness and headaches.  Hematological:  Does not bruise/bleed easily.      Objective:    BP 132/85   Pulse (!) 120   Temp 97.9 F (36.6 C) (Temporal)   Resp 16   Ht 5\' 11"  (1.803 m)   Wt 202 lb 6.4 oz (91.8 kg)   SpO2 96%   BMI 28.23 kg/m  Nursing note and vital signs reviewed.  Physical Exam Constitutional:      General: He is not in acute distress.    Appearance: He is well-developed.  Cardiovascular:     Rate and Rhythm: Normal  rate and regular rhythm.     Heart sounds: Normal heart sounds. No murmur heard.    No friction rub. No gallop.  Pulmonary:     Effort: Pulmonary effort is normal. No respiratory distress.     Breath sounds: Normal breath sounds. No wheezing or rales.  Chest:     Chest wall: No tenderness.  Abdominal:     General: Bowel sounds are normal. There is no distension.     Palpations: Abdomen is soft. There is no mass.     Tenderness: There is no abdominal tenderness. There is no guarding or rebound.  Skin:    General: Skin is warm and dry.  Neurological:     Mental Status: He is alert and oriented to person, place, and time.   Psychiatric:        Behavior: Behavior normal.        Thought Content: Thought content normal.        Judgment: Judgment normal.         11/26/2021    3:32 PM 02/25/2017    4:27 PM 01/24/2017   12:38 PM  Depression screen PHQ 2/9  Decreased Interest 0 0 3  Down, Depressed, Hopeless 0 0 3  PHQ - 2 Score 0 0 6  Altered sleeping  2 0  Tired, decreased energy  0 0  Change in appetite  0 0  Feeling bad or failure about yourself   0 1  Trouble concentrating  0 1  Moving slowly or fidgety/restless  0 0  Suicidal thoughts  0 0  PHQ-9 Score  2 8  Difficult doing work/chores  Somewhat difficult Somewhat difficult       Assessment & Plan:    Patient Active Problem List   Diagnosis Date Noted   Chronic hepatitis C without hepatic coma (HCC) 05/24/2021   OD (overdose of drug) 04/05/2021   Hepatitis C antibody test positive 04/05/2021   Acute CVA (cerebral vascular accident) (HCC) 04/05/2021   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 Check Hepatitis C RNA level today for sustained viremic response. No future hepatocellular carcinoma screening is needed. If future screening for hepatitis C is required will need Hepatitis C RNA level check as Hepatitis C antibody will always be positive. Follow up with ID as needed pending lab work results.       Relevant Orders   Hepatitis C RNA quantitative     I am having Corey Rivera maintain his sacubitril-valsartan, ondansetron, multivitamin with  minerals, carvedilol, aspirin EC, spironolactone, and Mavyret.   Follow-up: Return if symptoms worsen or fail to improve.   Marcos Eke, MSN, FNP-C Nurse Practitioner [facility] Main number: [phone]

## 2021-11-27 LAB — HEPATITIS C RNA QUANTITATIVE
HCV Quantitative Log: <1.18 log IU/mL
HCV RNA, PCR, QN: <15 IU/mL

## 2022-02-06 ENCOUNTER — Encounter: Payer: Self-pay | Admitting: *Deleted

## 2022-04-26 ENCOUNTER — Emergency Department (HOSPITAL_COMMUNITY)
Admission: EM | Admit: 2022-04-26 | Discharge: 2022-04-27 | Disposition: A | Discharge status: Home / Self Care | Payer: 59 | Attending: Emergency Medicine | Admitting: Emergency Medicine

## 2022-04-26 ENCOUNTER — Emergency Department (HOSPITAL_COMMUNITY): Payer: 59

## 2022-04-26 ENCOUNTER — Encounter (HOSPITAL_COMMUNITY): Payer: Self-pay

## 2022-04-26 DIAGNOSIS — R0602 Shortness of breath: Secondary | ICD-10-CM | POA: Diagnosis present

## 2022-04-26 DIAGNOSIS — Z7982 Long term (current) use of aspirin: Secondary | ICD-10-CM | POA: Diagnosis not present

## 2022-04-26 DIAGNOSIS — J189 Pneumonia, unspecified organism: Secondary | ICD-10-CM

## 2022-04-26 LAB — BASIC METABOLIC PANEL
Anion gap: 13 (ref 5–15)
BUN: 12 mg/dL (ref 6–20)
CO2: 25 mmol/L (ref 22–32)
Calcium: 9.7 mg/dL (ref 8.9–10.3)
Chloride: 100 mmol/L (ref 98–111)
Creatinine, Ser: 0.95 mg/dL (ref 0.61–1.24)
GFR, Estimated: >60 mL/min (ref >60)
Glucose, Bld: 119 mg/dL — ABNORMAL HIGH (ref 70–99)
Potassium: 3.4 mmol/L — ABNORMAL LOW (ref 3.5–5.1)
Sodium: 138 mmol/L (ref 135–145)

## 2022-04-26 LAB — CBC
HCT: 42.3 % (ref 39.0–52.0)
Hemoglobin: 14.3 g/dL (ref 13.0–17.0)
MCH: 29.7 pg (ref 26.0–34.0)
MCHC: 33.8 g/dL (ref 30.0–36.0)
MCV: 87.8 fL (ref 80.0–100.0)
Platelets: 185 10*3/uL (ref 150–400)
RBC: 4.82 MIL/uL (ref 4.22–5.81)
RDW: 13.1 % (ref 11.5–15.5)
WBC: 16.8 10*3/uL — ABNORMAL HIGH (ref 4.0–10.5)
nRBC: 0 % (ref 0.0–0.2)

## 2022-04-26 MED ORDER — DEXAMETHASONE SODIUM PHOSPHATE 10 MG/ML IJ SOLN
10.0000 mg | Freq: Once | INTRAMUSCULAR | Status: AC
  Administered 2022-04-26: 10 mg via INTRAVENOUS
  Filled 2022-04-26: qty 1

## 2022-04-26 MED ORDER — SODIUM CHLORIDE 0.9 % IV BOLUS
1000.0000 mL | Freq: Once | INTRAVENOUS | Status: AC
  Administered 2022-04-26: 1000 mL via INTRAVENOUS

## 2022-04-26 MED ORDER — IOHEXOL 350 MG/ML SOLN
80.0000 mL | Freq: Once | INTRAVENOUS | Status: AC | PRN
  Administered 2022-04-26: 80 mL via INTRAVENOUS

## 2022-04-26 MED ORDER — ONDANSETRON 4 MG PO TBDP
4.0000 mg | ORAL_TABLET | Freq: Once | ORAL | Status: DC
  Filled 2022-04-26: qty 1

## 2022-04-26 MED ORDER — SODIUM CHLORIDE 0.9 % IV SOLN
1.0000 g | Freq: Once | INTRAVENOUS | Status: AC
  Administered 2022-04-26: 1 g via INTRAVENOUS
  Filled 2022-04-26: qty 10

## 2022-04-26 MED ORDER — DOXYCYCLINE HYCLATE 100 MG PO CAPS: 100.0000 mg | ORAL_CAPSULE | Freq: Two times a day (BID) | ORAL | 0 refills | Status: AC

## 2022-04-26 MED ORDER — ACETAMINOPHEN 325 MG PO TABS
650.0000 mg | ORAL_TABLET | Freq: Once | ORAL | Status: AC
  Administered 2022-04-26: 650 mg via ORAL
  Filled 2022-04-26: qty 2

## 2022-04-26 NOTE — ED Provider Triage Note (Signed)
Emergency Medicine Provider Triage Evaluation Note  Corey Rivera , a 27 y.o. male  was evaluated in triage.  Pt complains of cough, body aches, shortness of breath. Endorses some shaking, feeling jittery, feverish after albuterol treatment earlier today. Endorses 1/2 ppd tobacco use, hasn't had anything in around one week. Denies chest pain. Endorses some nausea, vomiting.  Review of Systems  Positive: Chills, bodyaches, cough Negative: Chest pain  Physical Exam  BP (!) 144/93 (BP Location: Left Arm)   Pulse (!) 112   Temp 99.3 F (37.4 C) (Oral)   Resp 20   SpO2 93%  Gen:   Awake, no distress   Resp:  Normal effort  MSK:   Moves extremities without difficulty  Other:  Patient does have rhonchi, mild wheezing throughout especially upper lung fields, no stridor, no respiratory distress on my exam.' Tachycardic, normal rhythm  Medical Decision Making  Medically screening exam initiated at 6:28 PM.  Appropriate orders placed.  Corey Rivera was informed that the remainder of the evaluation will be completed by another provider, this initial triage assessment does not replace that evaluation, and the importance of remaining in the ED until their evaluation is complete.  Workup initiated in triage    Anselmo Pickler, [place] 04/26/22 [id number]

## 2022-04-26 NOTE — ED Provider Notes (Signed)
27yo male with SHOB, cough, bodyaches. RSV + at UC this AM, given albuterol MDI and felt jittery with HR 112.  CXR with RML PNA, O2 low 90s, pending CTA for PE vs PNA. Rx for abx in chart.  ?IV abx while in ER Physical Exam  BP 136/71   Pulse 84   Temp 99.3 F (37.4 C) (Oral)   Resp 13   SpO2 93%   Physical Exam  Procedures  Procedures  ED Course / MDM    Medical Decision Making Amount and/or Complexity of Data Reviewed Radiology: ordered.  Risk Prescription drug management.   CTA negative for PT, does show PNA. Treated with rocephin while in the ER, dc with doxy. Discussed follow up with PCP to ensure resolution.  O2 sat 97% on RA in the room. Resp even and unlabored.        Tacy Learn, PA-C 04/26/22 2229    Davonna Belling, MD 04/26/22 2300

## 2022-04-26 NOTE — ED Triage Notes (Signed)
Pt arrived via POV, c/o SOB, was seen at UC, given albuterol neb tx, started feeling very jittery and overall worse while getting it.

## 2022-04-26 NOTE — Discharge Instructions (Addendum)
You were prescribed antibiotics to help treat your infection, please take 1 tablet twice a day for the next 7 days.   You will need to obtain a repeat x-ray in about a week to make sure this infection has been clear.  If you experience any worsening symptoms, you will need to return to the emergency department.

## 2022-04-26 NOTE — ED Provider Notes (Signed)
[facility] Provider Note   CSN: [id number] Arrival date & time: 04/26/22  1816     History  Chief Complaint  Patient presents with   Shortness of [place] is a 27 y.o. male.  27 y.o male with no PMH presents to the ED with a chief complaint of cough, body aches, shortness of breath.  Patient reports he was evaluated at urgent care today, diagnosed with RSV.  He received a nebulizer treatment while at urgent care, reports after this he began to feel worse, felt overall jittery, felt like he was perhaps having a reaction to this.  He has had breathing treatments in the past, they have not caused this in the past.  He does endorse feeling sick for the last couple days, has a 44-monthold at home who is also sick.  She reports he was called from urgent care in order to be evaluated in the emergency department.  He does report this chest tightness, leg wrapping around his entire chest, no alleviating or exacerbating factors.  Felt chills, along with a subjective fever.  Reports he stopped using tobacco a couple of days ago as he felt too ill to smoke.  Has only been taking Tylenol for symptomatic treatment.  No prior history of CAD, no prior history of clotting disorder, no other complaints noted.   The history is provided by the patient.  Shortness of Breath Associated symptoms: chest pain   Associated symptoms: no abdominal pain, no sore throat and no vomiting        Home Medications Prior to Admission medications   Medication Sig Start Date End Date Taking? Authorizing Provider  doxycycline (VIBRAMYCIN) 100 MG capsule Take 1 capsule (100 mg total) by mouth 2 (two) times daily for 7 days. 04/26/22 05/03/22 Yes SJaneece Fitting PA-C  aspirin EC 81 MG EC tablet Take 1 tablet (81 mg total) by mouth daily. Swallow whole. Patient not taking: Reported on 11/26/2021 04/11/21   JMurlean Iba MD  carvedilol (COREG) 3.125 MG tablet Take 1  tablet (3.125 mg total) by mouth 2 (two) times daily with a meal. Patient not taking: Reported on 11/26/2021 04/10/21   JMurlean Iba MD  Glecaprevir-Pibrentasvir (MAVYRET) 100-40 MG TABS Take 3 tablets by mouth daily with breakfast. Patient not taking: Reported on 11/26/2021 06/03/21   WEsmond Plants RPH-CPP  Multiple Vitamin (MULTIVITAMIN WITH MINERALS) TABS tablet Take 1 tablet by mouth daily. Patient not taking: Reported on 11/26/2021 04/11/21   JIrwin BrakemanL, MD  ondansetron (ZOFRAN) 4 MG tablet Take 1 tablet (4 mg total) by mouth every 6 (six) hours as needed for nausea. Patient not taking: Reported on 11/26/2021 04/10/21   JMurlean Iba MD  sacubitril-valsartan (ENTRESTO) 24-26 MG Take 1 tablet by mouth 2 (two) times daily. Patient not taking: Reported on 11/26/2021 04/10/21   JMurlean Iba MD  spironolactone (ALDACTONE) 25 MG tablet Take 0.5 tablets (12.5 mg total) by mouth daily. Patient not taking: Reported on 11/26/2021 05/15/21   RFay Records MD      Allergies    Patient has no known allergies.    Review of Systems   Review of Systems  Constitutional:  Positive for chills.  HENT:  Negative for sore throat.   Respiratory:  Positive for chest tightness and shortness of breath.   Cardiovascular:  Positive for chest pain.  Gastrointestinal:  Negative for abdominal pain, nausea and vomiting.  Genitourinary:  Negative for flank pain.  Musculoskeletal:  Negative for back pain.  All other systems reviewed and are negative.   Physical Exam Updated Vital Signs BP 136/71   Pulse 84   Temp 99.3 F (37.4 C) (Oral)   Resp 13   SpO2 93%  Physical Exam Vitals and nursing note reviewed.  Constitutional:      Appearance: He is well-developed.  HENT:     Head: Normocephalic and atraumatic.  Eyes:     General: No scleral icterus.    Pupils: Pupils are equal, round, and reactive to light.  Cardiovascular:     Heart sounds: Normal heart sounds.  Pulmonary:      Effort: Pulmonary effort is normal.     Breath sounds: Examination of the right-upper field reveals rales. Examination of the left-upper field reveals rhonchi. Examination of the right-lower field reveals rales. Examination of the left-lower field reveals rales. Rhonchi and rales present. No wheezing.  Chest:     Chest wall: No tenderness.  Abdominal:     General: Bowel sounds are normal. There is no distension.     Palpations: Abdomen is soft.     Tenderness: There is no abdominal tenderness.  Musculoskeletal:        General: No tenderness or deformity.     Cervical back: Normal range of motion.  Skin:    General: Skin is warm and dry.  Neurological:     Mental Status: He is alert and oriented to person, place, and time.     ED Results / Procedures / Treatments   Labs (all labs ordered are listed, but only abnormal results are displayed) Labs Reviewed  CBC - Abnormal; Notable for the following components:      Result Value   WBC 16.8 (*)    All other components within normal limits  BASIC METABOLIC PANEL - Abnormal; Notable for the following components:   Potassium 3.4 (*)    Glucose, Bld 119 (*)    All other components within normal limits    EKG None  Radiology DG Chest 2 View  Result Date: 04/26/2022 CLINICAL DATA:  Shortness of breath.  Cough and body aches EXAM: CHEST - 2 VIEW COMPARISON:  X-ray 04/07/2021 FINDINGS: Subtle opacity towards the middle lobe. Acute infiltrate is possible. No pneumothorax, effusion or edema. Normal cardiopericardial silhouette. IMPRESSION: Subtle middle lobe opacity.  Acute infiltrate.  Recommend follow-up Electronically Signed   By: Jill Side M.D.   On: 04/26/2022 18:38    Procedures Procedures    Medications Ordered in ED Medications  ondansetron (ZOFRAN-ODT) disintegrating tablet 4 mg (has no administration in time range)  sodium chloride 0.9 % bolus 1,000 mL (1,000 mLs Intravenous New Bag/Given 04/26/22 2127)  dexamethasone  (DECADRON) injection 10 mg (10 mg Intravenous Given 04/26/22 2129)    ED Course/ Medical Decision Making/ A&P                             Medical Decision Making Amount and/or Complexity of Data Reviewed Radiology: ordered.  Risk Prescription drug management.   This patient presents to the ED for concern of shortness of breath, this involves a number of treatment options, and is a complaint that carries with it a high risk of complications and morbidity.  The differential diagnosis includes viral illness, pneumonia versus PE.    Co morbidities: Discussed in HPI   Brief History:  Patient here with no underlying condition with shortness of  breath, body aches, chest pain has been ongoing for the past day.  He does have a sick child at home, diagnosed with RSV at urgent care and referred to the ED due to increased tachycardia.  He arrived to the ED with tachycardia, and hypoxia.   EMR reviewed including pt PMHx, past surgical history and past visits to ER.   See HPI for more details   Lab Tests:  I ordered and independently interpreted labs.  The pertinent results include:    Labs notable for CBC with a leukocytosis of 16.8, not receiving any steroids.  Hemoglobin is within normal limits.  BMP with some slight decrease in potassium, however the rest of his electrolytes are within normal limits.  Creatinine levels unremarkable.    Imaging Studies:  NAD. I personally reviewed all imaging studies and no acute abnormality found. I agree with radiology interpretation. Chest x-ray does show a subtle right middle lobe pneumonia.  Cardiac Monitoring:  The patient was maintained on a cardiac monitor.  I personally viewed and interpreted the cardiac monitored which showed an underlying rhythm of: Sinus tachycardia  EKG non-ischemic   Medicines ordered:  I ordered medication including decadron, bolus  for symptomatic control Reevaluation of the patient after these medicines showed  that the patient stayed the same I have reviewed the patients home medicines and have made adjustments as needed  Reevaluation:  After the interventions noted above I re-evaluated patient and found that they have :improved   Social Determinants of Health:  The patient's social determinants of health were a factor in the care of this patient  Problem List / ED Course:  Patient presents to the ED with a chief complaint of bodyaches, cough, upper respiratory symptoms.  Evaluated at urgent care, sent to the ED due to ongoing tachycardia, did have a nebulizer treatment and felt jittery after he received this, usually is able to tolerate this but was not able to do so today.  He is having some pain worsening when he takes a big inspiration.  He does have prior charted history of polysubstance abuse.  He arrived in the ED with low O2 saturation under 95%, he has been waxing and waning between 90 to 93% on room air.  No tachypnea, however tachycardic continue with a heart rate in the 112, concern for pulmonary embolism.  Labs were obtained, CBC with a leukocytosis, not recently given any steroids.  The rest of his labs are within normal limits.  Chest x-ray is concerning for subtle right lower lobe pneumonia.  His lungs are diminished to auscultation with some rhonchi along with rales.*For superimposed infection along with his RSV.  I did discuss with him obtaining CT angio to rule out pulmonary embolism.  He was given bolus, as he appears clinically dehydrated on exam.  Also given Decadron to help with breathing.   Dispostion:  Patient signed out to incoming team pending CTANGIO results.    Portions of this note were generated with Lobbyist. Dictation errors may occur despite best attempts at proofreading.   Final Clinical Impression(s) / ED Diagnoses Final diagnoses:  Community acquired pneumonia of right middle lobe of lung    Rx / DC Orders ED Discharge Orders           Ordered    doxycycline (VIBRAMYCIN) 100 MG capsule  2 times daily        04/26/22 2135              Janeece Fitting,  PA-C 04/26/22 2142    Davonna Belling, MD 04/26/22 2300

## 2022-04-28 ENCOUNTER — Telehealth: Payer: Self-pay

## 2022-04-28 NOTE — Telephone Encounter (Signed)
Transition Care Management Follow-up Telephone Call  Date discharged? 04/26/22  How have you been since you were released from the hospital? Has noticed no improvement.   Do you understand why you were in the hospital? Yes, pneumonia  Do you understand the discharge instructions? yes  Where were you discharged to? Home   Items Reviewed: Medications reviewed: yes Allergies reviewed: yes Dietary changes reviewed: yes Referrals reviewed: yes   Functional Questionnaire:  Activities of Daily Living (ADLs):   He states they are independent in the following: ambulation, bathing and hygiene, feeding, continence, grooming, toileting, and dressing States they require assistance with the following:  n/a  Any transportation issues/concerns?: no  Any patient concerns? no  Confirmed importance and date/time of follow-up visits scheduled yes Provider Appointment booked with Theresa Duty, PA 05/02/22 '@1130'$   Confirmed with patient if condition begins to worsen call PCP or go to the ER.  Patient was given the office number and encouraged to call back with question or concerns.  : yes

## 2022-04-29 NOTE — Telephone Encounter (Signed)
Noted and agreed, thank you.

## 2022-05-02 ENCOUNTER — Encounter: Payer: Self-pay | Admitting: Physician Assistant

## 2022-05-02 ENCOUNTER — Ambulatory Visit (INDEPENDENT_AMBULATORY_CARE_PROVIDER_SITE_OTHER): Payer: 59 | Admitting: Physician Assistant

## 2022-05-02 VITALS — BP 108/68 | HR 74 | Temp 97.3°F | Ht 71.0 in | Wt 203.0 lb

## 2022-05-02 DIAGNOSIS — E876 Hypokalemia: Secondary | ICD-10-CM

## 2022-05-02 DIAGNOSIS — J180 Bronchopneumonia, unspecified organism: Secondary | ICD-10-CM

## 2022-05-02 DIAGNOSIS — R739 Hyperglycemia, unspecified: Secondary | ICD-10-CM | POA: Diagnosis not present

## 2022-05-02 LAB — BASIC METABOLIC PANEL
BUN: 18 mg/dL (ref 6–23)
CO2: 33 mEq/L — ABNORMAL HIGH (ref 19–32)
Calcium: 10.1 mg/dL (ref 8.4–10.5)
Chloride: 99 mEq/L (ref 96–112)
Creatinine, Ser: 1.03 mg/dL (ref 0.40–1.50)
GFR: 101.41 mL/min (ref >60.00)
Glucose, Bld: 91 mg/dL (ref 70–99)
Potassium: 4.5 mEq/L (ref 3.5–5.1)
Sodium: 137 mEq/L (ref 135–145)

## 2022-05-02 LAB — CBC WITH DIFFERENTIAL/PLATELET
Basophils Absolute: 0 10*3/uL (ref 0.0–0.1)
Basophils Relative: 0.5 % (ref 0.0–3.0)
Eosinophils Absolute: 0.3 10*3/uL (ref 0.0–0.7)
Eosinophils Relative: 3.6 % (ref 0.0–5.0)
HCT: 38.5 % — ABNORMAL LOW (ref 39.0–52.0)
Hemoglobin: 13.2 g/dL (ref 13.0–17.0)
Lymphocytes Relative: 33.8 % (ref 12.0–46.0)
Lymphs Abs: 2.6 10*3/uL (ref 0.7–4.0)
MCHC: 34.4 g/dL (ref 30.0–36.0)
MCV: 87.3 fl (ref 78.0–100.0)
Monocytes Absolute: 0.5 10*3/uL (ref 0.1–1.0)
Monocytes Relative: 6.9 % (ref 3.0–12.0)
Neutro Abs: 4.2 10*3/uL (ref 1.4–7.7)
Neutrophils Relative %: 55.2 % (ref 43.0–77.0)
Platelets: 268 10*3/uL (ref 150.0–400.0)
RBC: 4.42 Mil/uL (ref 4.22–5.81)
RDW: 13.6 % (ref 11.5–15.5)
WBC: 7.7 10*3/uL (ref 4.0–10.5)

## 2022-05-02 LAB — HEMOGLOBIN A1C: Hgb A1c MFr Bld: 5.9 % (ref 4.6–6.5)

## 2022-05-02 NOTE — Patient Instructions (Addendum)
Good to see you today, Corey Rivera! Glad you're feeling better.  Please have labs checked today.  Chest XRAY to be done in THREE TO FOUR WEEKS: [address], [place] [address] Tel: [phone] Hours: M-F 8:30 am - 5:00 pm Closed for lunch 12:30 pm - 1:00 pm

## 2022-05-02 NOTE — Progress Notes (Signed)
Subjective:    Patient ID: Corey Rivera, male    DOB: 11/24/1995, 27 y.o.   MRN: [id number]  Chief Complaint  Patient presents with   Hospitalization Follow-up   Anticoagulation    Pt has no questions or concerns     HPI Patient is in today for ED f/up. PNA diagnosis 04/26/22.   Still has some cough, mostly a dry cough. Not feeling SOB or CP. No dizziness or fevers. Has noticed some frequent urination ongoing prior to infection. No other symptoms to report at this time. He has been back to work. Continuing doxycycline Rx.   Past Medical History:  Diagnosis Date   Allergy    Anemia    Anxiety    Depression    Drug addiction ([place])    GERD (gastroesophageal reflux disease)    History of chickenpox    Seizures ([place])     Past Surgical History:  Procedure Laterality Date   NO PAST SURGERIES     TEE WITHOUT CARDIOVERSION N/A 04/09/2021   Procedure: TRANSESOPHAGEAL ECHOCARDIOGRAM (TEE);  Surgeon: Arnoldo Lenis, MD;  Location: [facility];  Service: Endoscopy;  Laterality: N/A;    Family History  Problem Relation Age of Onset   Alcohol abuse Father    Alcohol abuse Brother    Asthma Brother    Diabetes Brother    Arthritis Maternal Grandmother    Asthma Maternal Grandmother    COPD Maternal Grandmother    Alcohol abuse Maternal Grandfather    Alcohol abuse Paternal Grandfather    Heart attack Paternal Grandfather    Hyperlipidemia Paternal Grandfather     Social History   Tobacco Use   Smoking status: Every Day    Packs/day: 0.00    Types: Cigarettes   Smokeless tobacco: Never  Substance Use Topics   Alcohol use: Not Currently   Drug use: Not Currently    Types: Cocaine, Marijuana    Comment: Roxicodone     No Known Allergies  Review of Systems NEGATIVE UNLESS OTHERWISE INDICATED IN HPI      Objective:     BP 108/68 (BP Location: Right Arm, Patient Position: Sitting)   Pulse 74   Temp (!) 97.3 F (36.3 C) (Temporal)   Ht '5\' 11"'$  (1.803 m)   Wt 203  lb (92.1 kg)   SpO2 99%   BMI 28.31 kg/m   Wt Readings from Last 3 Encounters:  05/02/22 203 lb (92.1 kg)  11/26/21 202 lb 6.4 oz (91.8 kg)  05/24/21 176 lb (79.8 kg)    BP Readings from Last 3 Encounters:  05/02/22 108/68  04/26/22 136/71  11/26/21 132/85     Physical Exam Vitals and nursing note reviewed.  Constitutional:      Appearance: Normal appearance.  Eyes:     Extraocular Movements: Extraocular movements intact.     Conjunctiva/sclera: Conjunctivae normal.     Pupils: Pupils are equal, round, and reactive to light.  Cardiovascular:     Rate and Rhythm: Normal rate and regular rhythm.     Pulses: Normal pulses.     Heart sounds: No murmur heard. Pulmonary:     Effort: Pulmonary effort is normal.     Breath sounds: Normal breath sounds.     Comments: Some dry cough present Skin:    Findings: No lesion or rash.  Neurological:     General: No focal deficit present.     Mental Status: He is alert and oriented to person, place, and time.  Psychiatric:  Mood and Affect: Mood normal.        Behavior: Behavior normal.        Assessment & Plan:  Bilateral bronchopneumonia -     CBC with Differential/Platelet -     DG Chest 2 View; Future  Hypokalemia -     Basic metabolic panel  Hyperglycemia -     Hemoglobin A1c   I personally reviewed patient's recent ED visit, notes, and labs. Plan to recheck labs today. Recheck chest XRAY in 3-4 weeks. Continue doxycycline to completion. Back to ED should any red flags arise. Pt agreeable with plan.      Return in about 1 month (around 06/02/2022) for Annual CPE / recheck .    Terryn Redner M Nikolina Simerson, PA-C

## 2022-07-18 ENCOUNTER — Encounter: Payer: Self-pay | Admitting: Family Medicine

## 2022-07-18 ENCOUNTER — Telehealth: Payer: 59 | Admitting: Physician Assistant

## 2022-07-18 ENCOUNTER — Telehealth (INDEPENDENT_AMBULATORY_CARE_PROVIDER_SITE_OTHER): Payer: 59 | Admitting: Family Medicine

## 2022-07-18 ENCOUNTER — Telehealth: Payer: Self-pay | Admitting: Physician Assistant

## 2022-07-18 DIAGNOSIS — F419 Anxiety disorder, unspecified: Secondary | ICD-10-CM

## 2022-07-18 MED ORDER — LORAZEPAM 0.5 MG PO TABS: 0.5000 mg | ORAL_TABLET | Freq: Three times a day (TID) | ORAL | 0 refills | Status: DC | PRN

## 2022-07-18 NOTE — Telephone Encounter (Signed)
If in agreement with Dr. Laury Axon for pt to see PCP within 1 month, please let me know. I will contact pt for any scheduling changes if necessary.

## 2022-07-18 NOTE — Patient Instructions (Signed)
Managing Anxiety, Adult After being diagnosed with anxiety, you may be relieved to know why you have felt or behaved a certain way. You may also feel overwhelmed about the treatment ahead and what it will mean for your life. With care and support, you can manage your anxiety. How to manage lifestyle changes Understanding the difference between stress and anxiety Although stress can play a role in anxiety, it is not the same as anxiety. Stress is your body's reaction to life changes and events, both good and bad. Stress is often caused by something external, such as a deadline, test, or competition. It normally goes away after the event has ended and will last just a few hours. But, stress can be ongoing and can lead to more than just stress. Anxiety is caused by something internal, such as imagining a terrible outcome or worrying that something will go wrong that will greatly upset you. Anxiety often does not go away even after the event is over, and it can become a long-term (chronic) worry. Lowering stress and anxiety Talk with your health care provider or a counselor to learn more about lowering anxiety and stress. They may suggest tension-reduction techniques, such as: Music. Spend time creating or listening to music that you enjoy and that inspires you. Mindfulness-based meditation. Practice being aware of your normal breaths while not trying to control your breathing. It can be done while sitting or walking. Centering prayer. Focus on a word, phrase, or sacred image that means something to you and brings you peace. Deep breathing. Expand your stomach and inhale slowly through your nose. Hold your breath for 3-5 seconds. Then breathe out slowly, letting your stomach muscles relax. Self-talk. Learn to notice and spot thought patterns that lead to anxiety reactions. Change those patterns to thoughts that feel peaceful. Muscle relaxation. Take time to tense muscles and then relax them. Choose a  tension-reduction technique that fits your lifestyle and personality. These techniques take time and practice. Set aside 5-15 minutes a day to do them. Specialized therapists can offer counseling and training in these techniques. The training to help with anxiety may be covered by some insurance plans. Other things you can do to manage stress and anxiety include: Keeping a stress diary. This can help you learn what triggers your reaction and then learn ways to manage your response. Thinking about how you react to certain situations. You may not be able to control everything, but you can control your response. Making time for activities that help you relax and not feeling guilty about spending your time in this way. Doing visual imagery. This involves imagining or creating mental pictures to help you relax. Practicing yoga. Through yoga poses, you can lower tension and relax.  Medicines Medicines for anxiety include: Antidepressant medicines. These are usually prescribed for long-term daily control. Anti-anxiety medicines. These may be added in severe cases, especially when panic attacks occur. When used together, medicines, psychotherapy, and tension-reduction techniques may be the most effective treatment. Relationships Relationships can play a big part in helping you recover. Spend more time connecting with trusted friends and family members. Think about going to couples counseling if you have a partner, taking family education classes, or going to family therapy. Therapy can help you and others better understand your anxiety. How to recognize changes in your anxiety Everyone responds differently to treatment for anxiety. Recovery from anxiety happens when symptoms lessen and stop interfering with your daily life at home or work. This may mean that you   will start to: Have better concentration and focus. Worry will interfere less in your daily thinking. Sleep better. Be less irritable. Have more  energy. Have improved memory. Try to recognize when your condition is getting worse. Contact your provider if your symptoms interfere with home or work and you feel like your condition is not improving. Follow these instructions at home: Activity Exercise. Adults should: Exercise for at least 150 minutes each week. The exercise should increase your heart rate and make you sweat (moderate-intensity exercise). Do strengthening exercises at least twice a week. Get the right amount and quality of sleep. Most adults need 7-9 hours of sleep each night. Lifestyle  Eat a healthy diet that includes plenty of vegetables, fruits, whole grains, low-fat dairy products, and lean protein. Do not eat a lot of foods that are high in fats, added sugars, or salt (sodium). Make choices that simplify your life. Do not use any products that contain nicotine or tobacco. These products include cigarettes, chewing tobacco, and vaping devices, such as e-cigarettes. If you need help quitting, ask your provider. Avoid caffeine, alcohol, and certain over-the-counter cold medicines. These may make you feel worse. Ask your pharmacist which medicines to avoid. General instructions Take over-the-counter and prescription medicines only as told by your provider. Keep all follow-up visits. This is to make sure you are managing your anxiety well or if you need more support. Where to find support You can get help and support from: Self-help groups. Online and community organizations. A trusted spiritual leader. Couples counseling. Family education classes. Family therapy. Where to find more information You may find that joining a support group helps you deal with your anxiety. The following sources can help you find counselors or support groups near you: Mental Health America: mentalhealthamerica.net Anxiety and Depression Association of America (ADAA): adaa.org National Alliance on Mental Illness (NAMI): nami.org Contact  a health care provider if: You have a hard time staying focused or finishing tasks. You spend many hours a day feeling worried about everyday life. You are very tired because you cannot stop worrying. You start to have headaches or often feel tense. You have chronic nausea or diarrhea. Get help right away if: Your heart feels like it is racing. You have shortness of breath. You have thoughts of hurting yourself or others. Get help right away if you feel like you may hurt yourself or others, or have thoughts about taking your own life. Go to your nearest emergency room or: Call 911. Call the National Suicide Prevention Lifeline at 1-800-273-8255 or 988. This is open 24 hours a day. Text the Crisis Text Line at 741741. This information is not intended to replace advice given to you by your health care provider. Make sure you discuss any questions you have with your health care provider. Document Revised: 11/19/2021 Document Reviewed: 06/03/2020 Elsevier Patient Education  2024 Elsevier Inc.  

## 2022-07-18 NOTE — Telephone Encounter (Signed)
Please see pt call note and advise if upcoming VV is ok

## 2022-07-18 NOTE — Telephone Encounter (Signed)
Patient had a virtual visit this morning to discuss anxiety but due to provider being out, he had to be re-scheduled. Pt began having a panic attack due to news of needing to push appointment out more. States he has been dealing with upsetting life events back to back. I was able to calm patient down and get him scheduled with Dr. Loreen Freud @ 2:20 on 5/24 sine medication has not been started. Pt is still down too meet virtually with PCP about anxiety on 5/29 @ 11am.

## 2022-07-18 NOTE — Progress Notes (Addendum)
MyChart Video Visit    Virtual Visit via Video Note   Patient location: Patient and provider in visit Provider location: Office  I discussed the limitations of evaluation and management by telemedicine and the availability of in person appointments. The patient expressed understanding and agreed to proceed.  Visit Date: 07/18/2022  Today's healthcare provider: Donato Schultz, DO     Subjective:    Patient ID: Corey Rivera, male    DOB: 06/20/1995, 27 y.o.   MRN: [id number]  Chief Complaint  Patient presents with   Anxiety    HPI Patient is in today for a telehealth video visit.  Lately he has been struggling with anxiety and panic attacks, most recently this morning. When he experiences a panic attack his emotions are "all over the place". Generally, his anxiety is worst at night when he is often alone and thinking.   He has also been under stress as his daughter has special needs and they have been to clinic visits weekly. Therefore he hasn't had much time to focus on his own health. He does not currently have a psychiatrist. The last medication he was on was Ativan, which he states worked well for him. He has tried Buspar several times but it is never effective. He also tried Lexapro in the past but this was stopped due to suicidal thoughts. Since his daughter was born last year, it has helped him to realize that his daughter is dependent on him. He denies any suicidal thoughts recently. Currently he is sober; at one point in the past he was using alcohol to self-medicate.  For the past 2-3 years he is also helping to take care of his grandmother who lives with him. Two years ago his brother died from an overdose. Because of this he allows his grandmother to hold his prescriptions and give him the tablets as needed.  About 2 years ago he had a significant episode of stress-induced paranoia. He was prescribed anti-psychotic medications that "made him feel like a zombie"  with no energy.   Past Medical History:  Diagnosis Date   Allergy    Anemia    Anxiety    Depression    Drug addiction (HCC)    GERD (gastroesophageal reflux disease)    History of chickenpox    Seizures (HCC)     Past Surgical History:  Procedure Laterality Date   NO PAST SURGERIES     TEE WITHOUT CARDIOVERSION N/A 04/09/2021   Procedure: TRANSESOPHAGEAL ECHOCARDIOGRAM (TEE);  Surgeon: Antoine Poche, MD;  Location: [facility];  Service: Endoscopy;  Laterality: N/A;    Family History  Problem Relation Age of Onset   Alcohol abuse Father    Alcohol abuse Brother    Asthma Brother    Diabetes Brother    Arthritis Maternal Grandmother    Asthma Maternal Grandmother    COPD Maternal Grandmother    Alcohol abuse Maternal Grandfather    Alcohol abuse Paternal Grandfather    Heart attack Paternal Grandfather    Hyperlipidemia Paternal Grandfather     Social History   Socioeconomic History   Marital status: Single    Spouse name: Not on file   Number of children: Not on file   Years of education: Not on file   Highest education level: Not on file  Occupational History   Not on file  Tobacco Use   Smoking status: Every Day    Packs/day: 0    Types: Cigarettes  Smokeless tobacco: Never  Substance and Sexual Activity   Alcohol use: Not Currently   Drug use: Not Currently    Types: Cocaine, Marijuana    Comment: Roxicodone   Sexual activity: Yes  Other Topics Concern   Not on file  Social History Narrative   Not on file   Social Determinants of Health   Financial Resource Strain: Not on file  Food Insecurity: Not on file  Transportation Needs: Not on file  Physical Activity: Not on file  Stress: Not on file  Social Connections: Not on file  Intimate Partner Violence: Not on file    No outpatient medications prior to visit.   No facility-administered medications prior to visit.    No Known Allergies  Review of Systems  Constitutional:  Negative  for fever and malaise/fatigue.  HENT:  Negative for congestion.   Eyes:  Negative for blurred vision.  Respiratory:  Negative for shortness of breath.   Cardiovascular:  Negative for chest pain, palpitations and leg swelling.  Gastrointestinal:  Negative for abdominal pain, blood in stool and nausea.  Genitourinary:  Negative for dysuria and frequency.  Musculoskeletal:  Negative for falls.  Skin:  Negative for rash.  Neurological:  Negative for dizziness, loss of consciousness and headaches.  Endo/Heme/Allergies:  Negative for environmental allergies.  Psychiatric/Behavioral:  Negative for depression, substance abuse and suicidal ideas. The patient is nervous/anxious.        + Stress       Objective:     Physical Exam: Gen: Awake, alert, no acute distress. Resp: Breathing is even and non-labored. Psych: anxious, fast speech, pt is not suicidal  Neuro: Alert and Oriented x3, + facial symmetry, speech is clear.  There were no vitals taken for this visit. Wt Readings from Last 3 Encounters:  05/02/22 203 lb (92.1 kg)  11/26/21 202 lb 6.4 oz (91.8 kg)  05/24/21 176 lb (79.8 kg)    Diabetic Foot Exam - Simple   No data filed    Lab Results  Component Value Date   WBC 7.7 05/02/2022   HGB 13.2 05/02/2022   HCT 38.5 (L) 05/02/2022   PLT 268.0 05/02/2022   GLUCOSE 91 05/02/2022   CHOL 70 04/06/2021   TRIG 18 04/06/2021   HDL 36 (L) 04/06/2021   LDLCALC 30 04/06/2021   ALT 9 07/25/2021   AST 12 07/25/2021   NA 137 05/02/2022   K 4.5 05/02/2022   CL 99 05/02/2022   CREATININE 1.03 05/02/2022   BUN 18 05/02/2022   CO2 33 (H) 05/02/2022   TSH 1.41 05/09/2021   INR 1.1 05/24/2021   HGBA1C 5.9 05/02/2022    Lab Results  Component Value Date   TSH 1.41 05/09/2021   Lab Results  Component Value Date   WBC 7.7 05/02/2022   HGB 13.2 05/02/2022   HCT 38.5 (L) 05/02/2022   MCV 87.3 05/02/2022   PLT 268.0 05/02/2022   Lab Results  Component Value Date   NA 137  05/02/2022   K 4.5 05/02/2022   CO2 33 (H) 05/02/2022   GLUCOSE 91 05/02/2022   BUN 18 05/02/2022   CREATININE 1.03 05/02/2022   BILITOT 0.3 07/25/2021   ALKPHOS 58 05/09/2021   AST 12 07/25/2021   ALT 9 07/25/2021   PROT 7.1 07/25/2021   ALBUMIN 4.5 05/09/2021   CALCIUM 10.1 05/02/2022   ANIONGAP 13 04/26/2022   GFR 101.41 05/02/2022   Lab Results  Component Value Date   CHOL 70 04/06/2021  Lab Results  Component Value Date   HDL 36 (L) 04/06/2021   Lab Results  Component Value Date   LDLCALC 30 04/06/2021   Lab Results  Component Value Date   TRIG 18 04/06/2021   Lab Results  Component Value Date   CHOLHDL 1.9 04/06/2021   Lab Results  Component Value Date   HGBA1C 5.9 05/02/2022       Assessment & Plan:   Problem List Items Addressed This Visit   None Visit Diagnoses     Anxiety    -  Primary   Relevant Medications   LORazepam (ATIVAN) 0.5 MG tablet   Other Relevant Orders   Ambulatory referral to Psychiatry      Pt did not want any other meds--- he has tried lexapro and buspar in past with side effects  Urgent referral to psych Pt informed about psych [facility] and [facility]   Meds ordered this encounter  Medications   LORazepam (ATIVAN) 0.5 MG tablet    Sig: Take 1 tablet (0.5 mg total) by mouth every 8 (eight) hours as needed for anxiety or sleep.    Dispense:  30 tablet    Refill:  0    I discussed the assessment and treatment plan with the patient. The patient was provided an opportunity to ask questions and all were answered. The patient agreed with the plan and demonstrated an understanding of the instructions.   The patient was advised to call back or seek an in-person evaluation if the symptoms worsen or if the condition fails to improve as anticipated.     I,Mathew Stumpf,acting as a Neurosurgeon for [employer], DO.,have documented all relevant documentation on the behalf of Donato Schultz, DO,as directed by  Donato Schultz, DO while in the presence of Donato Schultz, DO.   I, Donato Schultz, DO, personally preformed the services described in this documentation.  All medical record entries made by the scribe were at my direction and in my presence.  I have reviewed the chart and discharge instructions (if applicable) and agree that the record reflects my personal performance and is accurate and complete. 07/18/2022.  Donato Schultz, DO [place]  [facility] [phone] (phone) [phone] (fax)  [facility]

## 2022-07-22 ENCOUNTER — Telehealth: Payer: 59 | Admitting: Physician Assistant

## 2022-07-22 NOTE — Telephone Encounter (Signed)
Pt scheduled the VV and would prefer to keep that visit tomorrow. Advised pt I would be calling him 10 mins before appt to get him checked in and ready for VV. Pt verbalized understanding and agreement

## 2022-07-23 ENCOUNTER — Telehealth (INDEPENDENT_AMBULATORY_CARE_PROVIDER_SITE_OTHER): Payer: 59 | Admitting: Physician Assistant

## 2022-07-23 ENCOUNTER — Encounter: Payer: Self-pay | Admitting: Physician Assistant

## 2022-07-23 DIAGNOSIS — F419 Anxiety disorder, unspecified: Secondary | ICD-10-CM | POA: Diagnosis not present

## 2022-07-23 NOTE — Assessment & Plan Note (Signed)
Improving from last week with addition of Lorazepam 0.5 mg TID. Grandmother lives with him and holds his medications, giving to him as needed. He is still waiting for appt with psychiatry. Referrals placed today for social work and psychology. Plan to f/up with him in the next few weeks. He has info for [facility] if needed.

## 2022-07-23 NOTE — Progress Notes (Signed)
   Virtual Visit via Video Note  I connected with  Corey Rivera  on 07/23/22 at 11:00 AM EDT by a video enabled telemedicine application and verified that I am speaking with the correct person using two identifiers.  Location: Patient: home Provider: Nature conservation officer at [employer] Persons present: Patient and myself   I discussed the limitations of evaluation and management by telemedicine and the availability of in person appointments. The patient expressed understanding and agreed to proceed.   History of Present Illness:  27 yo male presenting for VV f/up from visit on 07/18/22 for anxiety.  Taking Lorazepam 0.5 mg TID since virtual visit with Dr. Zola Button on 07/18/22. States it is helping, especially with relaxing and sleep at night. Sometimes still having acute panic and aggravation. Life stressors going on at home. 77 year old daughter with special needs, his girlfriend is in trouble with the law and going through court; living with 17 yo grandmother. 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   He does not think he has received a call from psychiatry referral.    Observations/Objective:   Gen: Awake, alert, no acute distress Resp: Breathing is even and non-labored Psych: calm/pleasant demeanor, anxious & rapid speech  Neuro: Alert and Oriented x 3, + facial symmetry, speech is clear.   Assessment and Plan:  Problem List Items Addressed This Visit       Other   Anxiety - Primary    Improving from last week with addition of Lorazepam 0.5 mg TID. Grandmother lives with him and holds his medications, giving to him as needed. He is still waiting for appt with psychiatry. Referrals placed today for social work and psychology. Plan to f/up with him in the next few weeks. He has info for [facility] if needed.      Relevant Orders   Ambulatory referral to Social Work   Ambulatory referral to Psychology    Follow Up  Instructions:    I discussed the assessment and treatment plan with the patient. The patient was provided an opportunity to ask questions and all were answered. The patient agreed with the plan and demonstrated an understanding of the instructions.   The patient was advised to call back or seek an in-person evaluation if the symptoms worsen or if the condition fails to improve as anticipated.  Brittiny Levitz M Haneen Bernales, PA-C

## 2022-07-25 ENCOUNTER — Other Ambulatory Visit: Payer: Self-pay | Admitting: Family Medicine

## 2022-07-25 DIAGNOSIS — F419 Anxiety disorder, unspecified: Secondary | ICD-10-CM

## 2022-07-29 ENCOUNTER — Other Ambulatory Visit: Payer: Self-pay | Admitting: Family Medicine

## 2022-07-29 ENCOUNTER — Encounter: Payer: Self-pay | Admitting: Physician Assistant

## 2022-07-29 DIAGNOSIS — F419 Anxiety disorder, unspecified: Secondary | ICD-10-CM

## 2022-07-29 MED ORDER — LORAZEPAM 0.5 MG PO TABS: 0.5000 mg | ORAL_TABLET | Freq: Three times a day (TID) | ORAL | 0 refills | Status: DC | PRN

## 2022-07-29 NOTE — Telephone Encounter (Signed)
Please see pt msg and advise 

## 2022-08-21 ENCOUNTER — Other Ambulatory Visit: Payer: Self-pay | Admitting: Physician Assistant

## 2022-08-21 DIAGNOSIS — F419 Anxiety disorder, unspecified: Secondary | ICD-10-CM

## 2022-08-22 NOTE — Telephone Encounter (Signed)
Pt requesting refill for Lorazepam 0.5 mg. Last OV 07/23/2022.

## 2022-08-27 ENCOUNTER — Other Ambulatory Visit: Payer: Self-pay | Admitting: Physician Assistant

## 2022-08-27 DIAGNOSIS — F419 Anxiety disorder, unspecified: Secondary | ICD-10-CM

## 2022-08-27 NOTE — Telephone Encounter (Signed)
Last OV: 07/23/22  Next OV: none scheduled  Last filled: 07/29/22  Quantity: 90 tablets

## 2022-08-27 NOTE — Telephone Encounter (Signed)
Prescription Request  08/27/2022  LOV: 05/02/2022  What is the name of the medication or equipment?  LORazepam (ATIVAN) 0.5 MG tablet   Have you contacted your pharmacy to request a refill? Yes   Which pharmacy would you like this sent to? [id number] - [place], [place] - [address] [place] [address] Phone: [phone] Fax: [phone]   Patient notified that their request is being sent to the clinical staff for review and that they should receive a response within 2 business days.   Please advise at Mobile [phone] (mobile)

## 2022-08-28 ENCOUNTER — Other Ambulatory Visit: Payer: Self-pay | Admitting: Physician Assistant

## 2022-08-28 DIAGNOSIS — F419 Anxiety disorder, unspecified: Secondary | ICD-10-CM

## 2022-08-29 ENCOUNTER — Encounter: Payer: Self-pay | Admitting: Physician Assistant

## 2022-08-29 ENCOUNTER — Telehealth: Payer: Self-pay

## 2022-08-29 MED ORDER — LORAZEPAM 0.5 MG PO TABS: 0.5000 mg | ORAL_TABLET | Freq: Three times a day (TID) | ORAL | 0 refills | Status: DC | PRN

## 2022-08-29 NOTE — Telephone Encounter (Signed)
Pt called office upset due to refill requests being denied. Advised pt last appt 5/29 VV was advised he will need to follow up in a few weeks in office. Pt states he spoke with a gentleman in our office Wednesday who told him it was being sent to the pharmacy. Let pt know per PCP unable to fill until he is seen due to regulations. Attempted to assist pt in scheduling appt and pt stated he would call back with work schedule and availability.

## 2022-08-29 NOTE — Telephone Encounter (Signed)
Please refill RX.

## 2022-08-29 NOTE — Telephone Encounter (Signed)
Pt advised and scheduled for Monday.  

## 2022-08-29 NOTE — Telephone Encounter (Signed)
Pt called and advised, pt scheduled for Monday virtual appt

## 2022-08-29 NOTE — Telephone Encounter (Signed)
Please see pt msg and advise 

## 2022-09-01 ENCOUNTER — Telehealth (INDEPENDENT_AMBULATORY_CARE_PROVIDER_SITE_OTHER): Payer: 59 | Admitting: Physician Assistant

## 2022-09-01 ENCOUNTER — Encounter: Payer: Self-pay | Admitting: Physician Assistant

## 2022-09-01 VITALS — Ht 71.0 in | Wt 203.0 lb

## 2022-09-01 DIAGNOSIS — F419 Anxiety disorder, unspecified: Secondary | ICD-10-CM

## 2022-09-01 MED ORDER — LORAZEPAM 0.5 MG PO TABS: 0.5000 mg | ORAL_TABLET | Freq: Three times a day (TID) | ORAL | 1 refills | Status: DC | PRN

## 2022-09-01 NOTE — Assessment & Plan Note (Addendum)
Stable with Lorazepam 0.5 mg TID. Grandmother lives with him and holds his medications, giving to him as needed. Playing phone-tag with psych, says he will call and get scheduled today.  Plan to f/up with him in 6-8 weeks.  PDMP reviewed today, no red flags, filling appropriately.   He has info for [facility] if needed.

## 2022-09-01 NOTE — Progress Notes (Signed)
   Virtual Visit via Video Note  I connected with  Corey Rivera  on 09/01/22 at 11:00 AM EDT by a video enabled telemedicine application and verified that I am speaking with the correct person using two identifiers.  Location: Patient: home Provider: Nature conservation officer at [employer] Persons present: Patient and myself   I discussed the limitations of evaluation and management by telemedicine and the availability of in person appointments. The patient expressed understanding and agreed to proceed.   History of Present Illness:  27 yo male presenting for VV f/up from visit on 07/18/22 for anxiety.  Taking Lorazepam 0.5 mg TID since virtual visit with Dr. Zola Button on 07/18/22. States it is helping, especially with relaxing and sleep at night. Sometimes still having acute panic and aggravation. Life stressors going on at home. 51 year old daughter with special needs, his girlfriend is in trouble with the law and going through court; living with 53 yo grandmother. 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   Situation remains the same as above, now knows that his GF will have to spend time in jail. Continues to take lorazepam TID as directed. Has not called psych back yet.     Observations/Objective:  Speech is normal   Assessment and Plan:  Problem List Items Addressed This Visit       Other   Anxiety - Primary    Stable with Lorazepam 0.5 mg TID. Grandmother lives with him and holds his medications, giving to him as needed. Playing phone-tag with psych, says he will call and get scheduled today.  Plan to f/up with him in 6-8 weeks.  PDMP reviewed today, no red flags, filling appropriately.   He has info for [facility] if needed.      Relevant Medications   LORazepam (ATIVAN) 0.5 MG tablet     Follow Up Instructions:    I discussed the assessment and treatment plan with the patient. The patient was provided an opportunity to  ask questions and all were answered. The patient agreed with the plan and demonstrated an understanding of the instructions.   The patient was advised to call back or seek an in-person evaluation if the symptoms worsen or if the condition fails to improve as anticipated.  SCHEDULED INITIALLY AS VIDEO VISIT, HOWEVER, Video connection was lost at >50% of the duration of the visit, at which time the remainder of the visit was completed via audio ( PHONE CALL) only.   Total time spent on the phone: 11 minutes, 5 secs.   Jatoya Armbrister M Syed Zukas, PA-C

## 2022-09-10 ENCOUNTER — Ambulatory Visit: Payer: 59 | Admitting: Physician Assistant

## 2022-10-28 ENCOUNTER — Other Ambulatory Visit: Payer: Self-pay | Admitting: Physician Assistant

## 2022-10-28 DIAGNOSIS — F419 Anxiety disorder, unspecified: Secondary | ICD-10-CM

## 2022-10-28 NOTE — Telephone Encounter (Signed)
Last OV: 09/01/22 VV  Next OV: None scheduled  Last Filled: 09/01/22  Quantity: 90 w/ 1 refill

## 2022-11-14 ENCOUNTER — Telehealth (INDEPENDENT_AMBULATORY_CARE_PROVIDER_SITE_OTHER): Payer: 59 | Admitting: Physician Assistant

## 2022-11-14 DIAGNOSIS — F419 Anxiety disorder, unspecified: Secondary | ICD-10-CM

## 2022-11-14 NOTE — Assessment & Plan Note (Signed)
Stable with Lorazepam 0.5 mg TID.  Expressed to patient that it is not the goal to keep him on this as a long-term medication. He needs to call psychiatry and schedule visit with them. Number provided to him through AVS. Grandmother lives with him and holds his medications, giving to him as needed.  PDMP reviewed today, no red flags, filling appropriately.   He has info for [facility] if needed.

## 2022-11-14 NOTE — Progress Notes (Signed)
   Virtual Visit via Video Note  I connected with  Corey Rivera  on 11/14/22 at 12:30 PM EDT by a video enabled telemedicine application and verified that I am speaking with the correct person using two identifiers.  Location: Patient: home Provider: Nature conservation officer at [employer] Persons present: Patient and myself   I discussed the limitations of evaluation and management by telemedicine and the availability of in person appointments. The patient expressed understanding and agreed to proceed.   History of Present Illness:  27 yo male presenting for VV f/up from visit on 07/18/22 for anxiety.  Taking Lorazepam 0.5 mg TID since virtual visit with Dr. Zola Button on 07/18/22. States it is helping, especially with relaxing and sleep at night. Sometimes still having acute panic and aggravation. Life stressors going on at home. 33 year old daughter with special needs, his girlfriend is in trouble with the law and going through court; living with 20 yo grandmother. 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   Doing OK - today, just feeling stressed still  Young daughter has cerebral palsy confirmed A lot of stress at home still  GF still back and forth to court, no verdicts have been made yet. Next court date 10/19. Medicine does not seem to be helping as much anymore - recent panic attacks at work; had to take a leave of absence for a short time. Denies any SI or HI.     Observations/Objective:  A&Ox3; tired-sounding; mood ok, somewhat depressed.    Assessment and Plan:  Problem List Items Addressed This Visit       Other   Anxiety - Primary    Stable with Lorazepam 0.5 mg TID.  Expressed to patient that it is not the goal to keep him on this as a long-term medication. He needs to call psychiatry and schedule visit with them. Number provided to him through [employer]. Grandmother lives with him and holds his medications, giving to him as  needed.  PDMP reviewed today, no red flags, filling appropriately.   He has info for [facility] if needed.         Follow Up Instructions:    I discussed the assessment and treatment plan with the patient. The patient was provided an opportunity to ask questions and all were answered. The patient agreed with the plan and demonstrated an understanding of the instructions.   The patient was advised to call back or seek an in-person evaluation if the symptoms worsen or if the condition fails to improve as anticipated.   Kiven Vangilder M Ambermarie Honeyman, PA-C

## 2022-11-14 NOTE — Patient Instructions (Addendum)
Please call [facility] at : [phone]  to get setup with psychiatry.  We need to start process of weaning from the Lorazepam once established with psych.

## 2022-11-24 ENCOUNTER — Other Ambulatory Visit: Payer: Self-pay | Admitting: Physician Assistant

## 2022-11-24 DIAGNOSIS — F419 Anxiety disorder, unspecified: Secondary | ICD-10-CM

## 2022-11-25 NOTE — Telephone Encounter (Signed)
Last OV: 11/14/22  Next OV: None  Last Filled: 10/28/2022  Quantity: 90 w no refills  Was advised needs appt for more refills

## 2022-12-30 ENCOUNTER — Other Ambulatory Visit: Payer: Self-pay | Admitting: Physician Assistant

## 2022-12-30 DIAGNOSIS — F419 Anxiety disorder, unspecified: Secondary | ICD-10-CM

## 2022-12-30 NOTE — Telephone Encounter (Signed)
Pt requesting refill for Lorazepam 0.5 mg. Last OV 11/14/2022.

## 2023-01-28 ENCOUNTER — Other Ambulatory Visit: Payer: Self-pay | Admitting: Physician Assistant

## 2023-01-28 DIAGNOSIS — F419 Anxiety disorder, unspecified: Secondary | ICD-10-CM

## 2023-01-28 NOTE — Telephone Encounter (Signed)
Last OV: 11/14/22  Next OV: None  Last Filled: 12/30/22  Quantity: 60 w/ 0 refills

## 2023-01-29 ENCOUNTER — Telehealth: Payer: Self-pay | Admitting: Physician Assistant

## 2023-01-29 NOTE — Telephone Encounter (Signed)
Returned pt call and was unable to lvm due to mailbox being full. If patient returns call again please let me know so the call can be taken, or advise pt needs appt before any refills can be sent for requested medication.

## 2023-01-29 NOTE — Telephone Encounter (Signed)
Patient returned call. Requests to be called. 

## 2023-01-30 NOTE — Telephone Encounter (Signed)
FYI please advise of VV is ok or needs in office visit

## 2023-01-30 NOTE — Telephone Encounter (Signed)
Pt has been scheduled for VV on 12/13 @ 8:30 am.

## 2023-02-06 ENCOUNTER — Encounter: Payer: Self-pay | Admitting: Physician Assistant

## 2023-02-06 ENCOUNTER — Telehealth: Payer: 59 | Admitting: Physician Assistant

## 2023-02-06 DIAGNOSIS — Z6379 Other stressful life events affecting family and household: Secondary | ICD-10-CM | POA: Diagnosis not present

## 2023-02-06 DIAGNOSIS — F419 Anxiety disorder, unspecified: Secondary | ICD-10-CM

## 2023-02-06 MED ORDER — LORAZEPAM 0.5 MG PO TABS: 0.5000 mg | ORAL_TABLET | Freq: Every day | ORAL | 2 refills | Status: DC

## 2023-02-06 NOTE — Patient Instructions (Signed)
VISIT SUMMARY:  During today's visit, we discussed the various stressors you are currently facing, including caring for your daughter with cerebral palsy and managing your responsibilities as a single parent. We also addressed your anxiety and sleep issues, and created a plan to help manage these challenges.  YOUR PLAN:  -ANXIETY AND INSOMNIA: Anxiety and insomnia are often linked, with stress and worry making it difficult to sleep. We will reduce your Ativan usage to once daily for sleep and refer you to [facility] for a psychiatric evaluation and management. We will also consider alternative strategies for sleep management in the future.  -CAREGIVER STRESS: Caregiver stress occurs when the demands of caring for someone else become overwhelming. We recommend continuing to work with your social worker or case worker for support and resources, and exploring options for respite care through [place].  INSTRUCTIONS:  Please follow up with [facility] for a psychiatric evaluation. We will touch base in the new year to reassess your situation and make any necessary adjustments to your plan.

## 2023-02-06 NOTE — Progress Notes (Signed)
   Virtual Visit via Video Note  I connected with  Corey Rivera  on 02/06/23 at  8:30 AM EST by a video enabled telemedicine application and verified that I am speaking with the correct person using two identifiers.  Location: Patient: home Provider: Nature conservation officer at [employer] Persons present: Patient and myself   I discussed the limitations of evaluation and management by telemedicine and the availability of in person appointments. The patient expressed understanding and agreed to proceed.   History of Present Illness:  Discussed the use of AI scribe software for clinical note transcription with the patient, who gave verbal consent to proceed.  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 His daughter, who has cerebral palsy, has been making progress with his physical abilities but still requires constant care and attention. Corey Rivera reports that his daughter has started crawling and is trying to stand, but struggles with balance issues due to his condition. He is about to start occupational therapy to help with his fine motor skills.  In addition to caring for his daughter, Corey Rivera is also dealing with the absence of his girlfriend who is currently in jail. He is unsure of when she will be released, but estimates it could be in two to four months. This has added to his responsibilities and stress, as he is now the sole caregiver for his daughter.  Corey Rivera reports that he has been struggling with sleep, which he attributes to his busy schedule and constant worrying. He has been taking Ativan, which helps. He expresses a desire to manage his anxiety and sleep issues, but has not yet sought help from a psychiatrist due to his current circumstances.        Observations/Objective:   Gen: Awake, alert, no acute distress Resp: Breathing is even and non-labored Psych: calm/pleasant demeanor Neuro: Alert and Oriented x 3, +  facial symmetry, speech is clear.   Assessment and Plan:  Assessment and Plan    Anxiety and Insomnia Difficulty sleeping due to anxiety and stress. Chronic use of Ativan, with a plan to taper off. -Reduce Ativan to once daily for sleep. -Refer to [facility] for psychiatric evaluation and management. -Consider alternative strategies for sleep management in the future.  Caregiver Stress High level of stress due to caring for a child with cerebral palsy and current incarceration of the patient's girlfriend. -Continue to work with Administrator, Civil Service for support and resources. -Explore options for respite care through Medicaid.  Follow-up Plan to touch base in the new year to reassess the situation and make further adjustments to the plan as needed.        Follow Up Instructions:    I discussed the assessment and treatment plan with the patient. The patient was provided an opportunity to ask questions and all were answered. The patient agreed with the plan and demonstrated an understanding of the instructions.   The patient was advised to call back or seek an in-person evaluation if the symptoms worsen or if the condition fails to improve as anticipated.  Linn Goetze M Astryd Pearcy, PA-C

## 2023-06-29 ENCOUNTER — Other Ambulatory Visit (HOSPITAL_COMMUNITY): Payer: Self-pay

## 2023-08-31 ENCOUNTER — Emergency Department (HOSPITAL_BASED_OUTPATIENT_CLINIC_OR_DEPARTMENT_OTHER)
Admission: EM | Admit: 2023-08-31 | Discharge: 2023-08-31 | Disposition: A | Discharge status: Home / Self Care | Attending: Emergency Medicine | Admitting: Emergency Medicine

## 2023-08-31 ENCOUNTER — Other Ambulatory Visit: Payer: Self-pay

## 2023-08-31 ENCOUNTER — Emergency Department (HOSPITAL_BASED_OUTPATIENT_CLINIC_OR_DEPARTMENT_OTHER)

## 2023-08-31 DIAGNOSIS — Y92410 Unspecified street and highway as the place of occurrence of the external cause: Secondary | ICD-10-CM | POA: Insufficient documentation

## 2023-08-31 DIAGNOSIS — S79922A Unspecified injury of left thigh, initial encounter: Secondary | ICD-10-CM | POA: Diagnosis present

## 2023-08-31 DIAGNOSIS — S3991XA Unspecified injury of abdomen, initial encounter: Secondary | ICD-10-CM | POA: Diagnosis not present

## 2023-08-31 DIAGNOSIS — S7012XA Contusion of left thigh, initial encounter: Secondary | ICD-10-CM | POA: Diagnosis not present

## 2023-08-31 LAB — BASIC METABOLIC PANEL WITH GFR
Anion gap: 11 (ref 5–15)
BUN: 20 mg/dL (ref 6–20)
CO2: 27 mmol/L (ref 22–32)
Calcium: 9.9 mg/dL (ref 8.9–10.3)
Chloride: 101 mmol/L (ref 98–111)
Creatinine, Ser: 0.84 mg/dL (ref 0.61–1.24)
GFR, Estimated: >60 mL/min (ref >60)
Glucose, Bld: 97 mg/dL (ref 70–99)
Potassium: 4.2 mmol/L (ref 3.5–5.1)
Sodium: 138 mmol/L (ref 135–145)

## 2023-08-31 LAB — CBC WITH DIFFERENTIAL/PLATELET
Abs Immature Granulocytes: 0.01 K/uL (ref 0.00–0.07)
Basophils Absolute: 0 K/uL (ref 0.0–0.1)
Basophils Relative: 1 %
Eosinophils Absolute: 0.3 K/uL (ref 0.0–0.5)
Eosinophils Relative: 6 %
HCT: 29.9 % — ABNORMAL LOW (ref 39.0–52.0)
Hemoglobin: 10.1 g/dL — ABNORMAL LOW (ref 13.0–17.0)
Immature Granulocytes: 0 %
Lymphocytes Relative: 26 %
Lymphs Abs: 1.6 K/uL (ref 0.7–4.0)
MCH: 29.6 pg (ref 26.0–34.0)
MCHC: 33.8 g/dL (ref 30.0–36.0)
MCV: 87.7 fL (ref 80.0–100.0)
Monocytes Absolute: 0.8 K/uL (ref 0.1–1.0)
Monocytes Relative: 13 %
Neutro Abs: 3.3 K/uL (ref 1.7–7.7)
Neutrophils Relative %: 54 %
Platelets: 171 K/uL (ref 150–400)
RBC: 3.41 MIL/uL — ABNORMAL LOW (ref 4.22–5.81)
RDW: 12.8 % (ref 11.5–15.5)
WBC: 6 K/uL (ref 4.0–10.5)
nRBC: 0 % (ref 0.0–0.2)

## 2023-08-31 MED ORDER — IOHEXOL 300 MG/ML  SOLN
100.0000 mL | Freq: Once | INTRAMUSCULAR | Status: AC | PRN
  Administered 2023-08-31: 85 mL via INTRAVENOUS

## 2023-08-31 NOTE — Discharge Instructions (Signed)
 Return if any problems.

## 2023-08-31 NOTE — ED Notes (Signed)
 Pt discharged home and given discharge paperwork. Opportunities given for questions. Pt verbalizes understanding. PIV removed x1. Jillyn Hidden , RN
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
## 2023-09-27 IMAGING — DX DG CHEST 2V
2 series · 2 of 2 positions shown · non-contrast
Comparison: 04/06/2021

CLINICAL DATA: Dyspnea

EXAM:
CHEST - 2 VIEW

[chest lat]
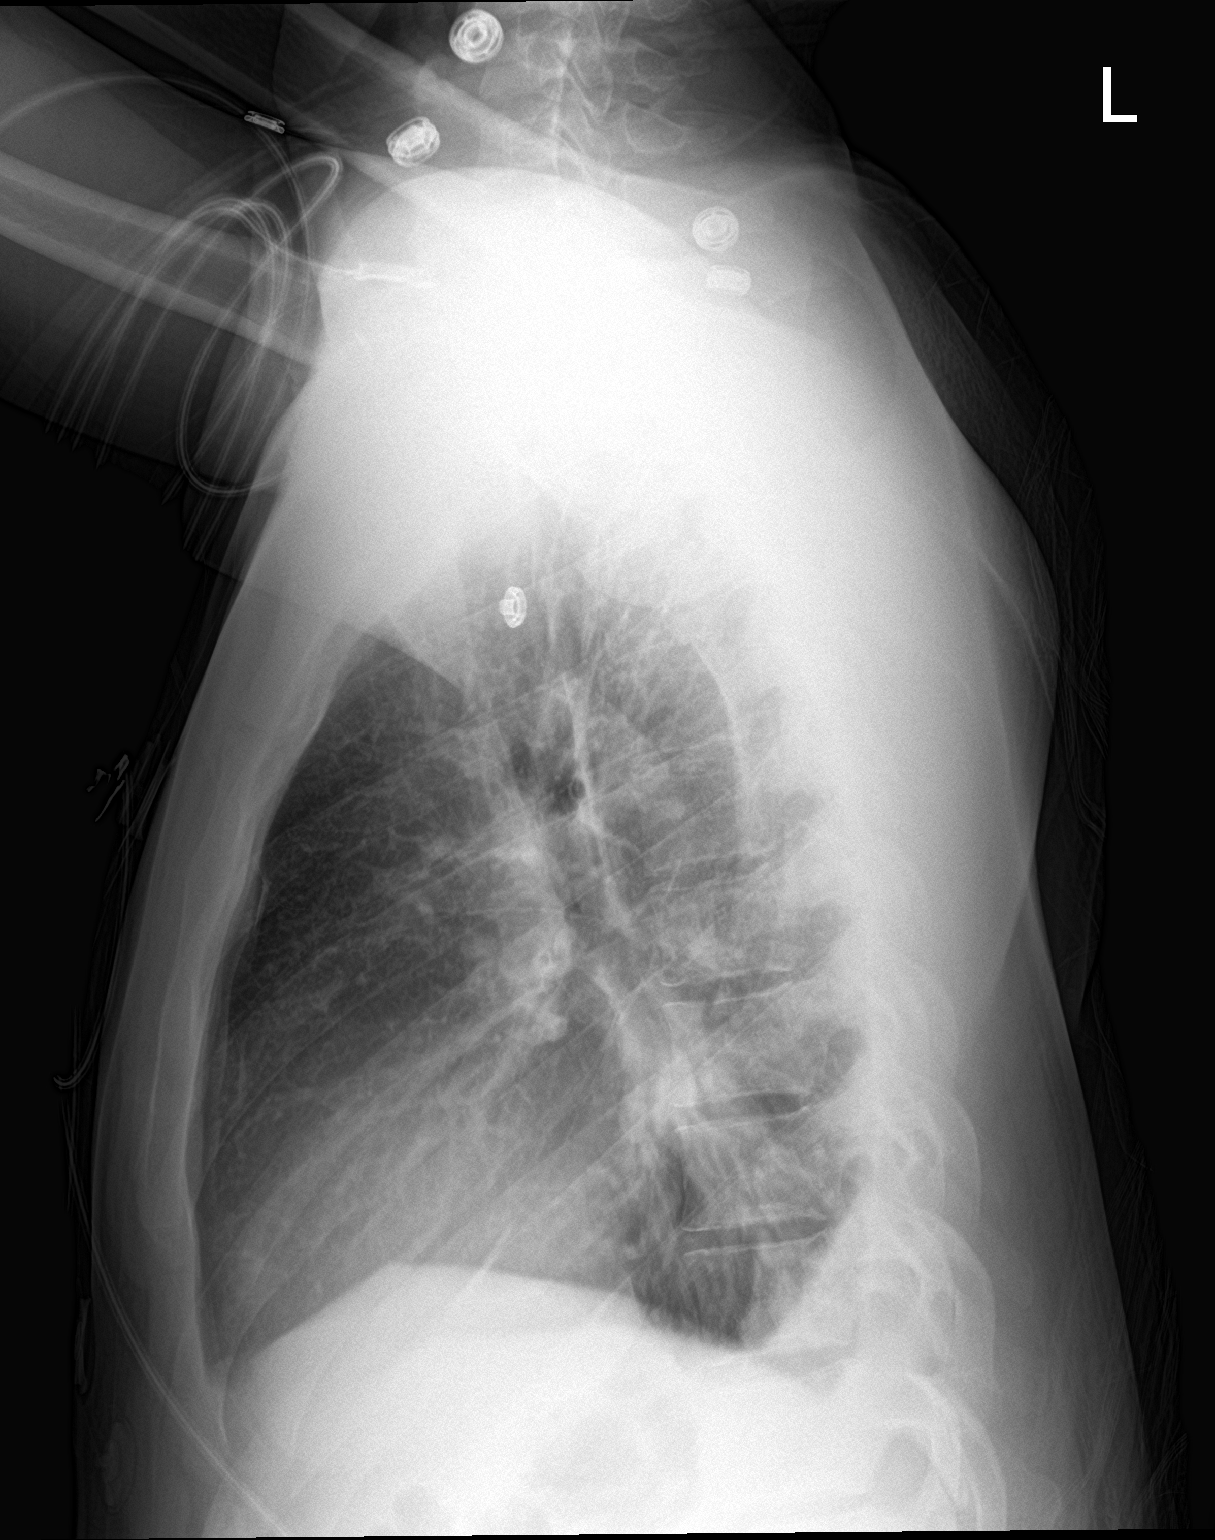

[chest ap]
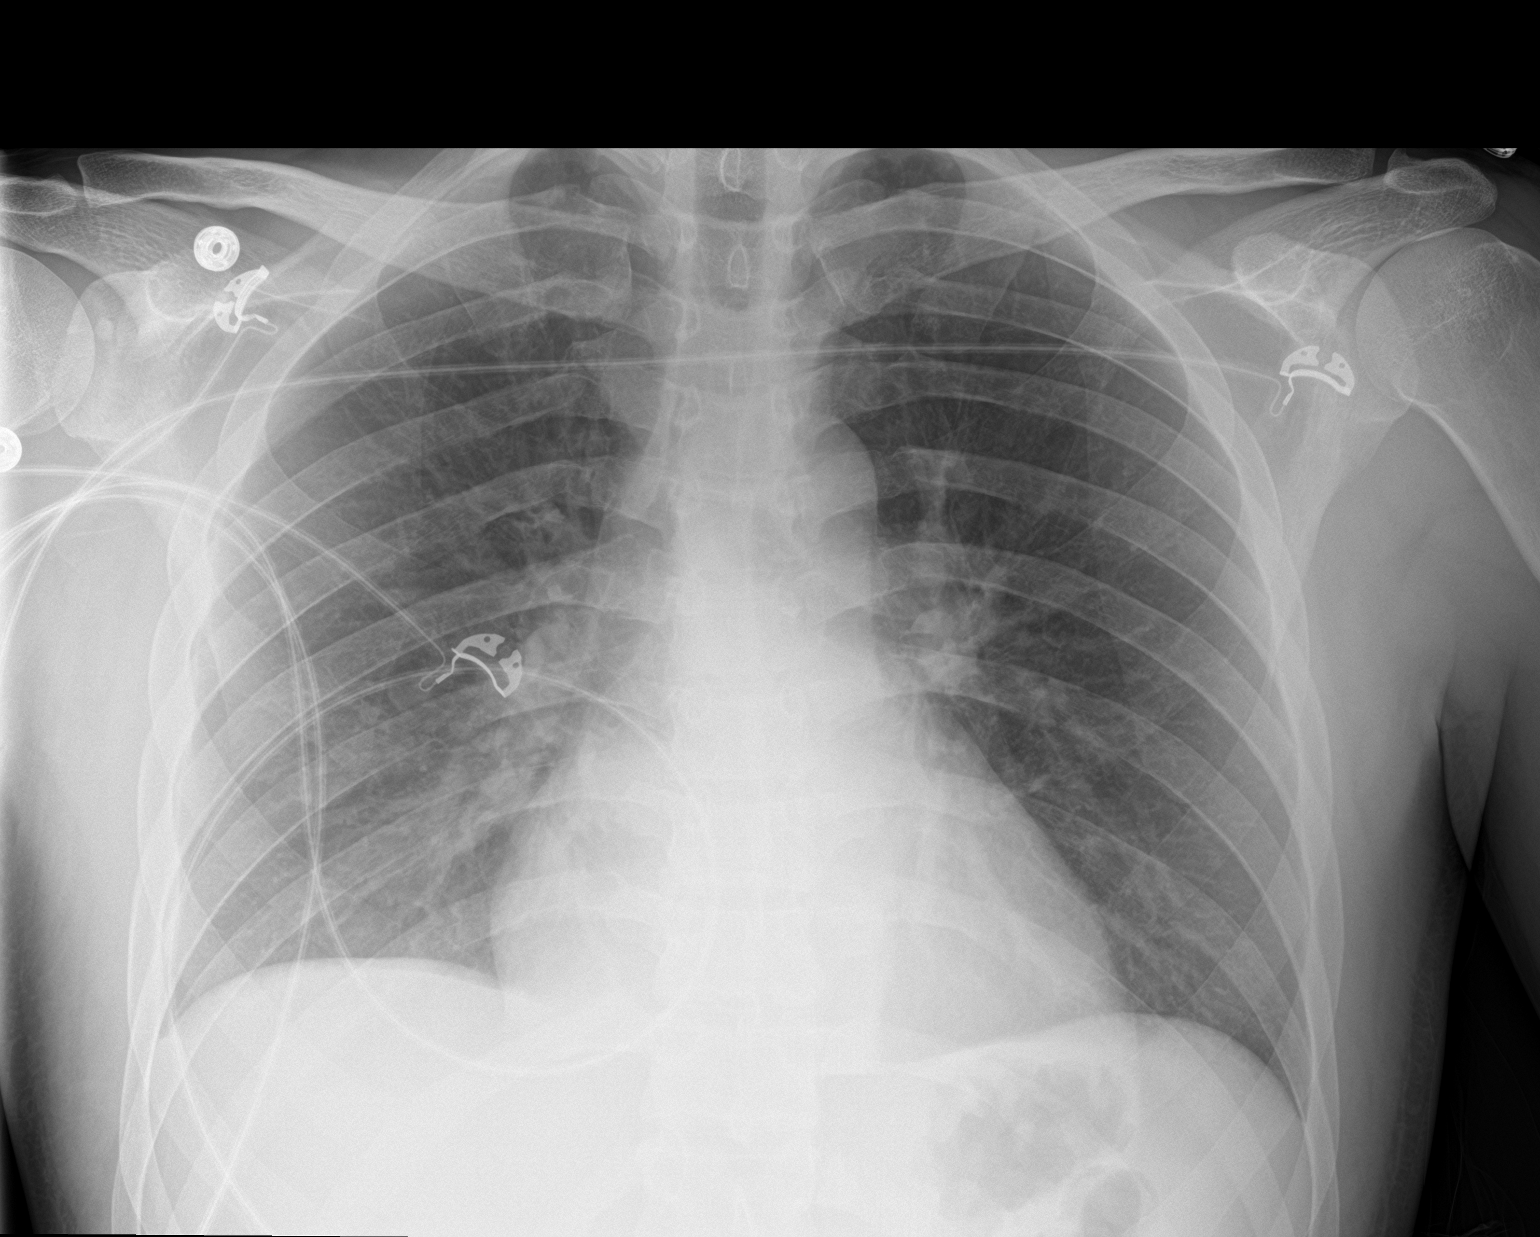

[2 of 2 positions shown; findings below may reference images not displayed]

FINDINGS: Two views study shows tiny bilateral pleural effusions, right
greater than left. No edema or focal airspace consolidation. The
cardiopericardial silhouette is within normal limits for size. The
visualized bony structures of the thorax show no acute abnormality.
Telemetry leads overlie the chest.
IMPRESSION: Tiny bilateral pleural effusions, right greater than left.

## 2023-09-27 IMAGING — US US EXTREM LOW VENOUS
1 series · 14 of 24 positions shown · non-contrast
Comparison: None.

CLINICAL DATA: Lower extremity edema, right greater than

EXAM:
RIGHT LOWER EXTREMITY VENOUS DOPPLER ULTRASOUND
TECHNIQUE: Gray-scale sonography with compression, as well as color and duplex
ultrasound, were performed to evaluate the deep venous system(s)
from the level of the common femoral vein through the popliteal and
proximal calf veins.

[Series 1: us venous img lower bilat (dvt) · portal-venous · 14 of 72 slices shown]
[im 1/72]
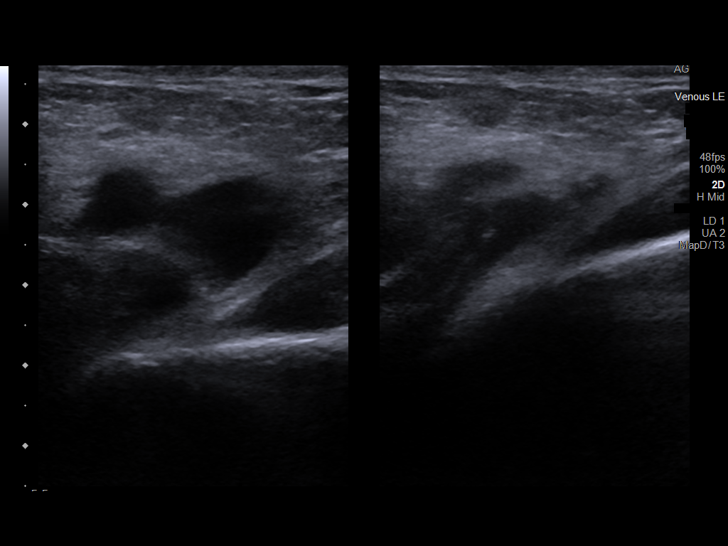
[im 7/72]
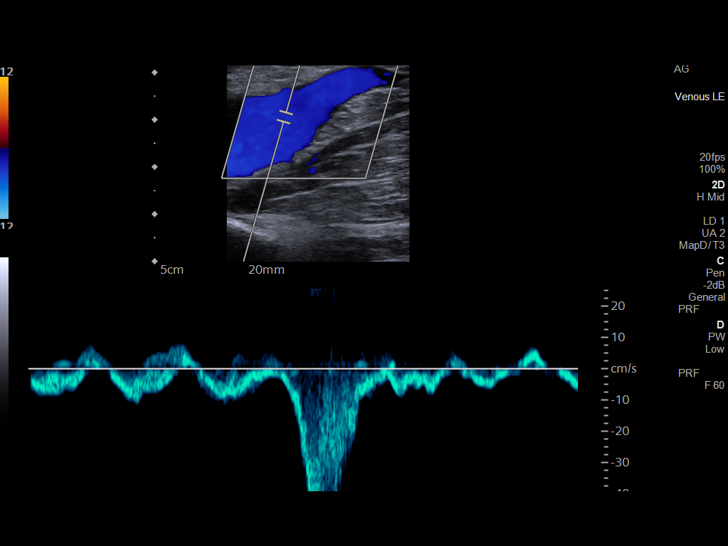
[im 13/72]
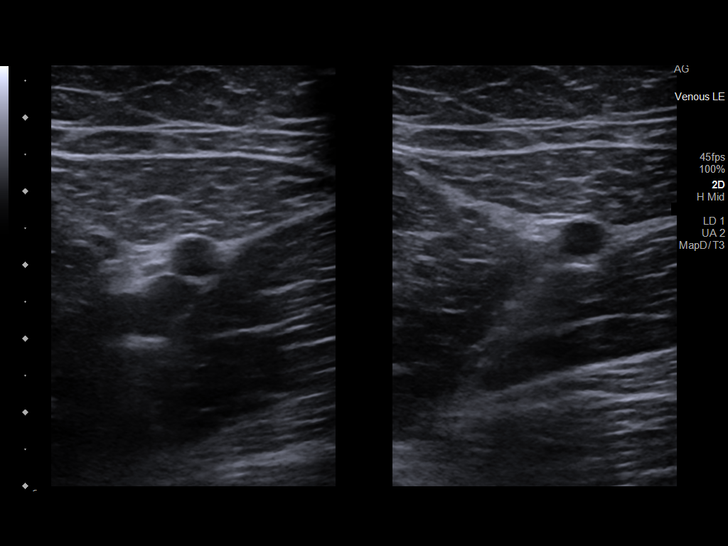
[im 19/72]
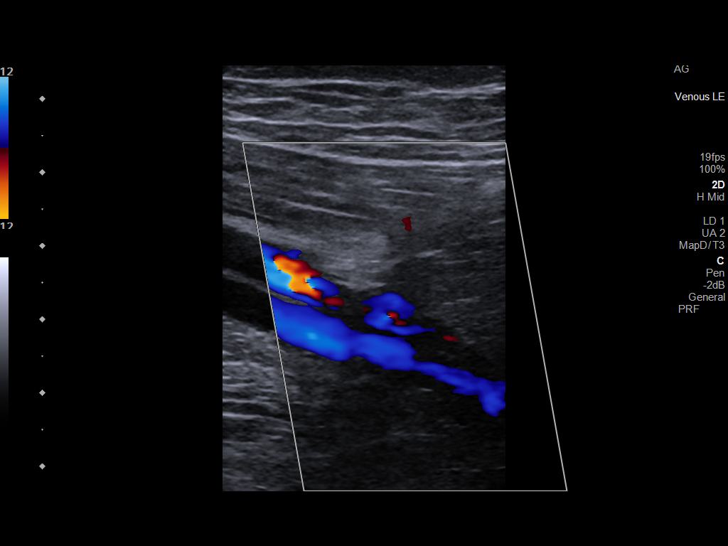
[im 22/72]
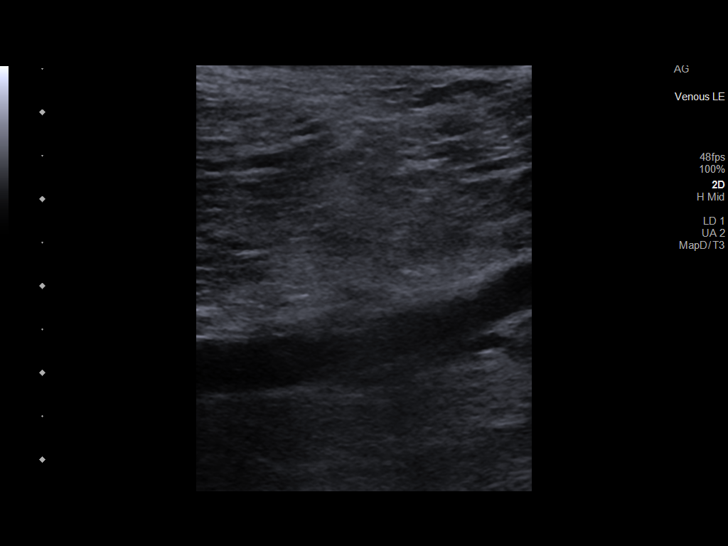
[im 28/72]
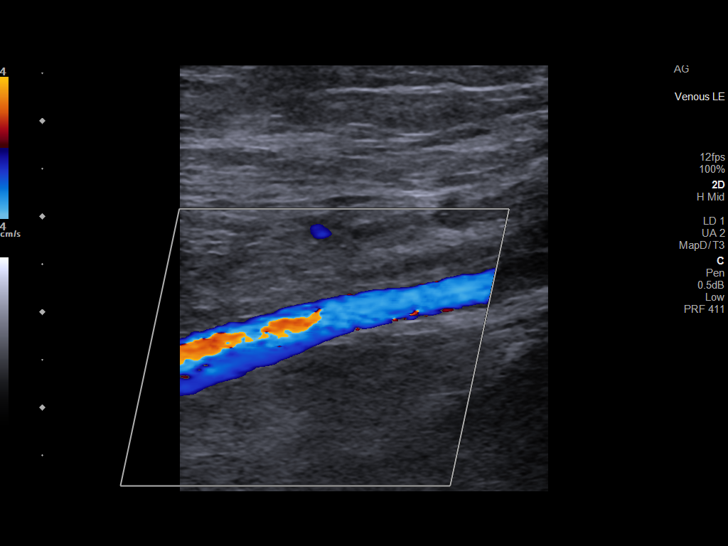
[im 34/72]
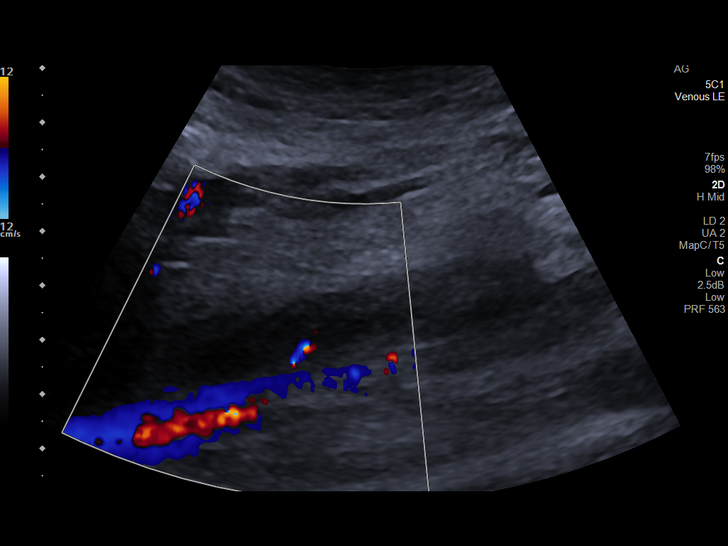
[im 38/72]
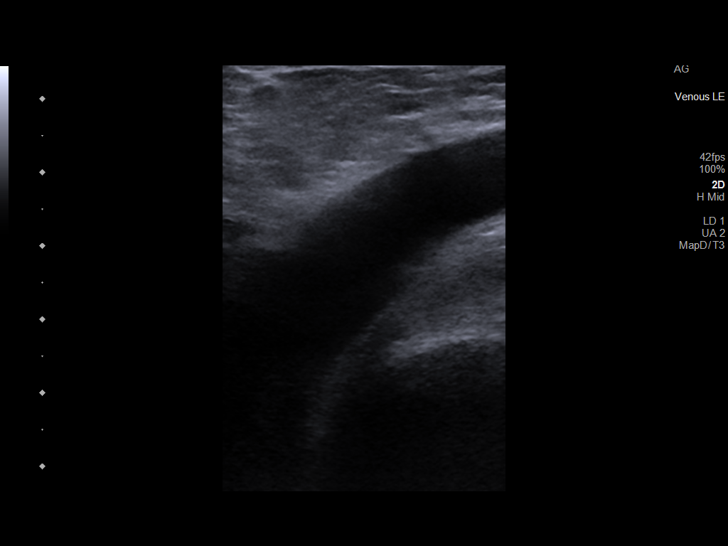
[im 44/72]
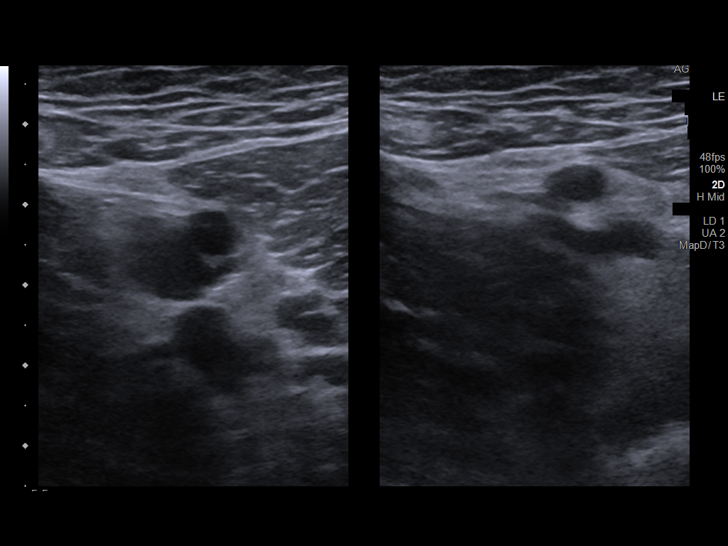
[im 50/72]
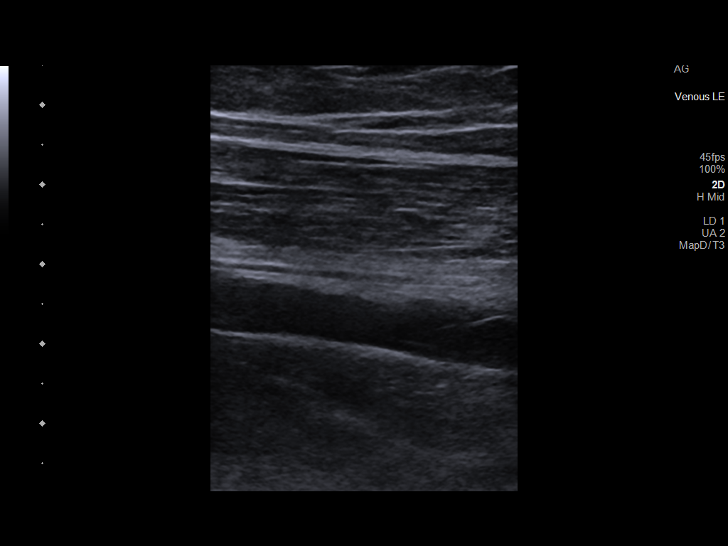
[im 56/72]
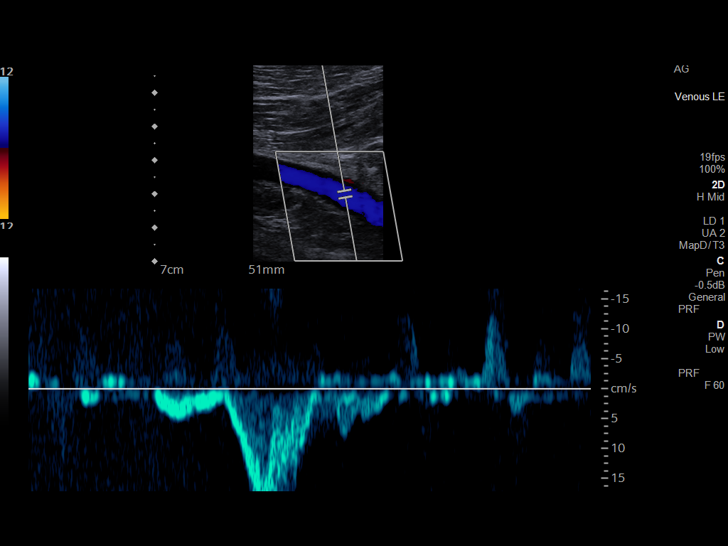
[im 59/72]
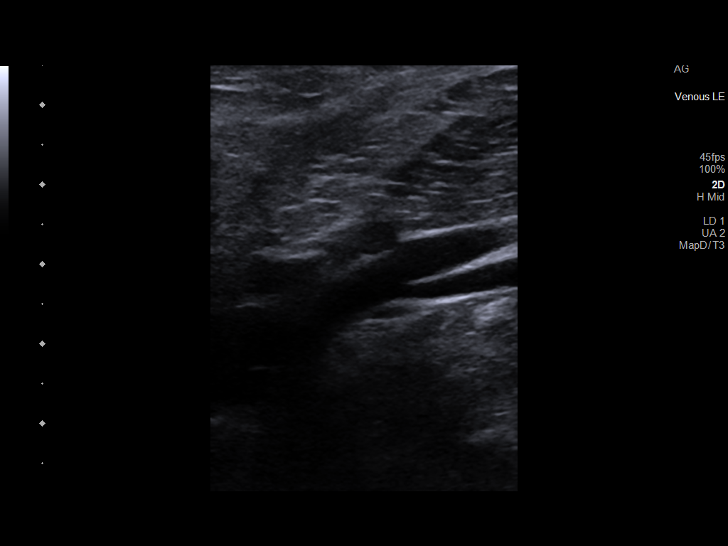
[im 65/72]
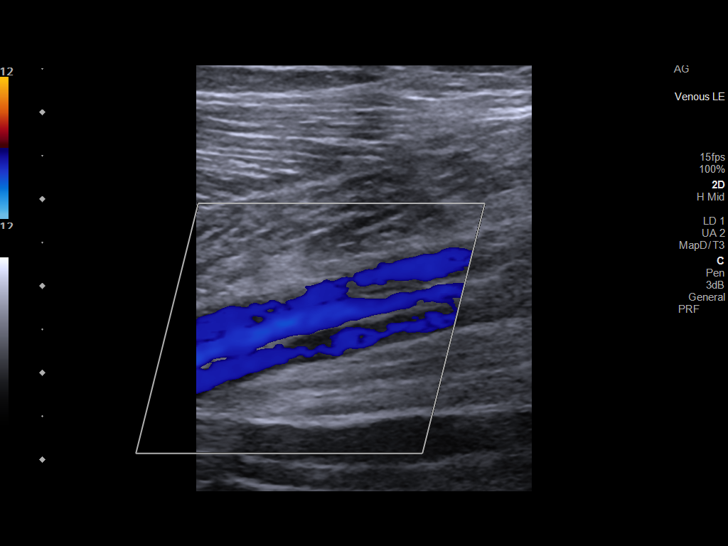
[im 72/72]
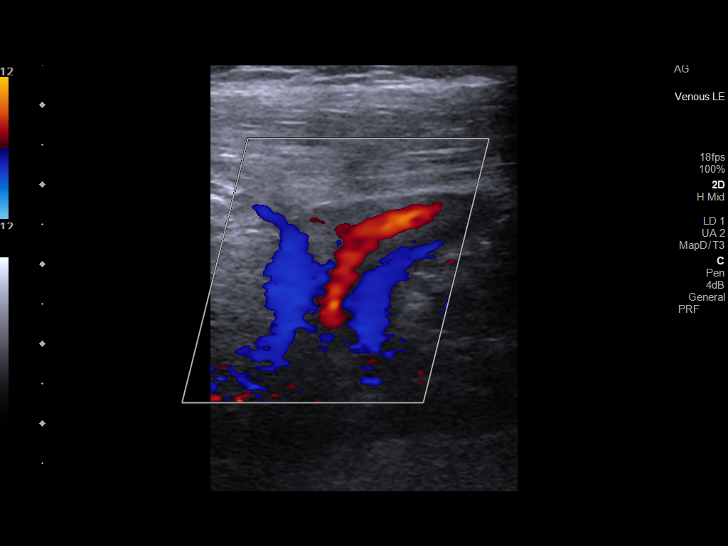

[14 of 24 positions shown; findings below may reference images not displayed]

FINDINGS: VENOUS

Normal compressibility of the common femoral, superficial femoral,
and popliteal veins, as well as the visualized calf veins.
Visualized portions of profunda femoral vein and great saphenous
vein unremarkable. No filling defects to suggest DVT on grayscale or
color Doppler imaging. Doppler waveforms show normal direction of
venous flow, normal respiratory plasticity and response to
augmentation.

Limited views of the contralateral common femoral vein are
unremarkable.

OTHER

Soft tissue edema.

Limitations: none
IMPRESSION: 1. Negative examination for deep venous thrombosis in the right
lower extremity.
2. Soft tissue edema.

## 2024-03-25 ENCOUNTER — Ambulatory Visit: Admitting: Physician Assistant

## 2024-03-25 ENCOUNTER — Encounter: Payer: Self-pay | Admitting: Physician Assistant

## 2024-03-25 VITALS — BP 130/86 | HR 86 | Temp 97.7°F | Ht 71.0 in | Wt 198.0 lb

## 2024-03-25 DIAGNOSIS — J029 Acute pharyngitis, unspecified: Secondary | ICD-10-CM

## 2024-03-25 LAB — POCT INFLUENZA A/B
Influenza A, POC: NEGATIVE
Influenza B, POC: NEGATIVE

## 2024-03-25 LAB — POCT COVID BINAXNOW CARD: SARS Coronavirus 2 Ag: NEGATIVE

## 2024-03-25 LAB — POCT RAPID STREP A (OFFICE): Rapid Strep A Screen: POSITIVE — AB

## 2024-03-25 MED ORDER — AMOXICILLIN 875 MG PO TABS: 875.0000 mg | ORAL_TABLET | Freq: Two times a day (BID) | ORAL | 0 refills | Status: AC

## 2024-03-25 NOTE — Progress Notes (Signed)
 "  History of Present Illness:   Chief Complaint  Patient presents with   Nasal Congestion   Headache   Sore Throat    Symptoms started two days ago. No fever. OTC meds not working.     Discussed the use of AI scribe software for clinical note transcription with the patient, who gave verbal consent to proceed.  History of Present Illness   Corey Rivera is a 29 year old male who presents with a positive strep test and associated symptoms.  He has had a persistent cough for 2 days with minimal relief from over-the-counter Mucinex  and an unspecified daytime medication. He developed sore throat and headache yesterday, with a severe headache Wednesday night, and has used Chloraseptic and ibuprofen. He denies fever and current ear pain. His 49.83-year-old daughter recently had a cold with significant rhinorrhea and no fever, now resolved. He notes elevated blood pressure during todays visit and reports he has reduced caffeine and coffee intake.        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 04/05/2021   Acute CVA (cerebral vascular accident) (HCC) 04/05/2021   Subcentimeter acute infarct within the mid-to-inferior left  cerebellar hemisphere--04/05/21         Acute pulmonary edema (HCC) 03/24/2017   Acute respiratory failure with hypoxia (HCC) 03/24/2017   Allergy    Anemia    Anxiety    Chronic hepatitis C without hepatic coma (HCC) 05/24/2021   Depression    Drug addiction (HCC)    GERD (gastroesophageal reflux disease)    Hepatitis C antibody test positive 04/05/2021   History of chickenpox    OD (overdose of drug) 04/05/2021   Polysubstance abuse (HCC)    Seizure (HCC) 03/24/2017   Seizures (HCC)      Social History[1]  Past Surgical History:  Procedure Laterality Date   NO PAST SURGERIES     TEE WITHOUT CARDIOVERSION N/A 04/09/2021   Procedure: TRANSESOPHAGEAL ECHOCARDIOGRAM (TEE);  Surgeon: Alvan Dorn FALCON, MD;   Location: [facility];  Service: Endoscopy;  Laterality: N/A;    Family History  Problem Relation Age of Onset   Alcohol abuse Father    Alcohol abuse Brother    Asthma Brother    Diabetes Brother    Arthritis Maternal Grandmother    Asthma Maternal Grandmother    COPD Maternal Grandmother    Alcohol abuse Maternal Grandfather    Alcohol abuse Paternal Grandfather    Heart attack Paternal Grandfather    Hyperlipidemia Paternal Grandfather     Allergies[2]  Current Medications:  Current Medications[3]   Review of Systems:   Negative unless otherwise specified per HPI.  Vitals:   Vitals:   03/25/24 1123 03/25/24 1140  BP: (!) 152/84 130/86  Pulse: 86   Temp: 97.7 F (36.5 C)   TempSrc: Temporal   SpO2: 98%   Weight: 198 lb (89.8 kg)   Height: 5' 11 (1.803 m)      Body mass index is 27.62 kg/m.  Physical Exam:   Physical Exam Vitals and nursing note reviewed.  Constitutional:      General: He is not in acute distress.    Appearance: He is well-developed. He is not ill-appearing or toxic-appearing.  HENT:     Head: Normocephalic and atraumatic.     Right Ear: Tympanic membrane, ear canal and external ear normal. Tympanic membrane is not erythematous, retracted or bulging.     Left Ear: Tympanic membrane,  ear canal and external ear normal. Tympanic membrane is not erythematous, retracted or bulging.     Nose: Nose normal.     Right Sinus: No maxillary sinus tenderness or frontal sinus tenderness.     Left Sinus: No maxillary sinus tenderness or frontal sinus tenderness.     Mouth/Throat:     Pharynx: Uvula midline. Posterior oropharyngeal erythema present.  Eyes:     General: Lids are normal.     Conjunctiva/sclera: Conjunctivae normal.  Neck:     Trachea: Trachea normal.  Cardiovascular:     Rate and Rhythm: Normal rate and regular rhythm.     Pulses: Normal pulses.     Heart sounds: Normal heart sounds, S1 normal and S2 normal.  Pulmonary:     Effort:  Pulmonary effort is normal.     Breath sounds: Normal breath sounds. No decreased breath sounds, wheezing, rhonchi or rales.  Lymphadenopathy:     Cervical: Cervical adenopathy present.  Skin:    General: Skin is warm and dry.  Neurological:     Mental Status: He is alert.     GCS: GCS eye subscore is 4. GCS verbal subscore is 5. GCS motor subscore is 6.  Psychiatric:        Speech: Speech normal.        Behavior: Behavior normal. Behavior is cooperative.    Results for orders placed or performed in visit on 03/25/24  POCT Influenza A/B  Result Value Ref Range   Influenza A, POC Negative Negative   Influenza B, POC Negative Negative  POCT rapid strep A  Result Value Ref Range   Rapid Strep A Screen Positive (A) Negative  POCT COVID BINAX NOW CARD  Result Value Ref Range   SARS Coronavirus 2 Ag Negative Negative    Assessment and Plan:   Assessment and Plan    Streptococcal pharyngitis Acute streptococcal pharyngitis confirmed by positive strep test. - Prescribed antibiotics, amoxicillin  - Advised to discard toothbrush during and after treatment to prevent reinfection. - Recommended staying home for 24 hours after starting antibiotics unless fever-free. - Provided work note for return to work if feeling well.  Lucie Buttner, PA-C    [1]  Social History Tobacco Use   Smoking status: Every Day    Types: Cigarettes   Smokeless tobacco: Never  Substance Use Topics   Alcohol use: Not Currently   Drug use: Not Currently    Types: Cocaine, Marijuana    Comment: Roxicodone  [2] No Known Allergies [3]  Current Outpatient Medications:    amoxicillin  (AMOXIL ) 875 MG tablet, Take 1 tablet (875 mg total) by mouth 2 (two) times daily for 10 days., Disp: 20 tablet, Rfl: 0  "

## 2024-03-25 NOTE — Patient Instructions (Signed)
 Start amoxicillin   Your blood pressure is elevated in our office today.  I recommend that you monitor this at home.  Your goal blood pressure should be around < 130/80, unless you are over 27 years old, your goal may be closer to 140-150/90. Please note if you have been given other goals from a cardiologist or other healthcare provider, please defer to their recommendations.  When preparing to take your blood pressure: Plan ahead. Dont smoke, drink caffeine or exercise within 30 minutes before taking your blood pressure. Empty your bladder. Don't take the measurement over clothes. Remove the clothing over the arm that will be used to measure blood pressure. You can use either arm unless otherwise told by a healthcare provider. Usually there is not a big difference between readings on them. Be still. Allow at least five minutes of quiet rest before measurements. Dont talk or use the phone. Sit correctly. Sit with your back straight and supported (on a dining chair, rather than a sofa). Your feet should be flat on the floor. Do not cross your legs. Support your arm on a flat surface. The middle of the cuff should be placed on the upper arm at heart level.  Measure at the same time of the day. Take multiple readings and record the results. Each time you measure, take two readings one minute apart. Record the results and bring in to your next office visit.  In order to know how well the medication is working, I would like you to take your readings 1-2 hours after taking your blood pressure medication if possible. Take your blood pressure measurements and record 2-3 days per week.  If you get a high blood pressure reading: A single high reading is not an immediate cause for alarm. If you get a reading that is higher than normal, take your blood pressure a second time. Write down the results of both measurements. Check with your health care professional to see if theres a health concern or whether  there may be problems with your monitor. If your blood pressure readings are suddenly higher than 180/120 mm Hg, wait at least one minute and test again. If your readings are still very high, contact your health care professional immediately. You could be having a hypertensive crisis. Call 911 if your blood pressure is higher than 180/120 mm Hg and if you are having new signs or symptoms that may include: Chest pain Shortness of breath Back pain Numbness Weakness Change in vision Difficulty speaking Confusion Dizziness Vomiting
